# Patient Record
Sex: Female | Born: 1960
Health system: Southern US, Community
[De-identification: ages and names within clinical notes are randomized; demographics above are authoritative.]

## PROBLEM LIST (undated history)

## (undated) DIAGNOSIS — F32A Depression, unspecified: Secondary | ICD-10-CM

## (undated) DIAGNOSIS — F329 Major depressive disorder, single episode, unspecified: Secondary | ICD-10-CM

## (undated) DIAGNOSIS — F419 Anxiety disorder, unspecified: Secondary | ICD-10-CM

## (undated) DIAGNOSIS — D509 Iron deficiency anemia, unspecified: Secondary | ICD-10-CM

## (undated) DIAGNOSIS — E079 Disorder of thyroid, unspecified: Secondary | ICD-10-CM

## (undated) DIAGNOSIS — E039 Hypothyroidism, unspecified: Secondary | ICD-10-CM

## (undated) HISTORY — PX: OTHER SURGICAL HISTORY: SHX169

## (undated) HISTORY — DX: Depression, unspecified: F32.A

## (undated) HISTORY — DX: Anxiety disorder, unspecified: F41.9

## (undated) HISTORY — DX: Major depressive disorder, single episode, unspecified: F32.9

## (undated) HISTORY — PX: PLACEMENT OF BREAST IMPLANTS: SHX6334

---

## 1997-07-02 ENCOUNTER — Other Ambulatory Visit: Admission: RE | Admit: 1997-07-02 | Discharge: 1997-07-02 | Payer: Self-pay | Admitting: Obstetrics and Gynecology

## 1997-10-03 ENCOUNTER — Other Ambulatory Visit: Admission: RE | Admit: 1997-10-03 | Discharge: 1997-10-03 | Payer: Self-pay | Admitting: Obstetrics and Gynecology

## 1998-07-15 ENCOUNTER — Other Ambulatory Visit: Admission: RE | Admit: 1998-07-15 | Discharge: 1998-07-15 | Payer: Self-pay | Admitting: Obstetrics and Gynecology

## 1998-12-23 ENCOUNTER — Encounter (INDEPENDENT_AMBULATORY_CARE_PROVIDER_SITE_OTHER): Payer: Self-pay

## 1998-12-23 ENCOUNTER — Ambulatory Visit (HOSPITAL_COMMUNITY): Admission: RE | Admit: 1998-12-23 | Discharge: 1998-12-23 | Payer: Self-pay | Admitting: *Deleted

## 1999-07-28 ENCOUNTER — Other Ambulatory Visit: Admission: RE | Admit: 1999-07-28 | Discharge: 1999-07-28 | Payer: Self-pay | Admitting: Obstetrics and Gynecology

## 2000-08-16 ENCOUNTER — Other Ambulatory Visit: Admission: RE | Admit: 2000-08-16 | Discharge: 2000-08-16 | Payer: Self-pay | Admitting: Obstetrics and Gynecology

## 2001-10-03 ENCOUNTER — Other Ambulatory Visit: Admission: RE | Admit: 2001-10-03 | Discharge: 2001-10-03 | Payer: Self-pay | Admitting: Obstetrics and Gynecology

## 2002-10-09 ENCOUNTER — Other Ambulatory Visit: Admission: RE | Admit: 2002-10-09 | Discharge: 2002-10-09 | Payer: Self-pay | Admitting: Obstetrics and Gynecology

## 2003-11-12 ENCOUNTER — Other Ambulatory Visit: Admission: RE | Admit: 2003-11-12 | Discharge: 2003-11-12 | Payer: Self-pay | Admitting: Obstetrics and Gynecology

## 2004-12-23 ENCOUNTER — Other Ambulatory Visit: Admission: RE | Admit: 2004-12-23 | Discharge: 2004-12-23 | Payer: Self-pay | Admitting: Obstetrics and Gynecology

## 2007-04-04 ENCOUNTER — Ambulatory Visit: Payer: Self-pay | Admitting: Gastroenterology

## 2009-08-10 ENCOUNTER — Emergency Department (HOSPITAL_COMMUNITY): Admission: EM | Admit: 2009-08-10 | Discharge: 2009-08-10 | Payer: Self-pay | Admitting: Emergency Medicine

## 2009-11-04 ENCOUNTER — Emergency Department (HOSPITAL_COMMUNITY): Admission: EM | Admit: 2009-11-04 | Discharge: 2009-11-05 | Payer: Self-pay | Admitting: Emergency Medicine

## 2010-01-29 ENCOUNTER — Other Ambulatory Visit (HOSPITAL_COMMUNITY): Payer: Self-pay | Admitting: Endocrinology

## 2010-01-29 DIAGNOSIS — E049 Nontoxic goiter, unspecified: Secondary | ICD-10-CM

## 2010-02-10 ENCOUNTER — Ambulatory Visit (HOSPITAL_COMMUNITY): Payer: Self-pay

## 2010-02-11 ENCOUNTER — Other Ambulatory Visit (HOSPITAL_COMMUNITY): Payer: Self-pay

## 2010-02-26 ENCOUNTER — Other Ambulatory Visit: Payer: Self-pay | Admitting: Endocrinology

## 2010-02-26 DIAGNOSIS — E049 Nontoxic goiter, unspecified: Secondary | ICD-10-CM

## 2010-02-27 ENCOUNTER — Other Ambulatory Visit: Payer: Self-pay

## 2010-02-27 ENCOUNTER — Ambulatory Visit
Admission: RE | Admit: 2010-02-27 | Discharge: 2010-02-27 | Disposition: A | Discharge status: Home / Self Care | Payer: BC Managed Care – PPO | Source: Ambulatory Visit | Attending: Endocrinology | Admitting: Endocrinology

## 2010-02-27 DIAGNOSIS — E049 Nontoxic goiter, unspecified: Secondary | ICD-10-CM

## 2010-03-17 ENCOUNTER — Other Ambulatory Visit: Payer: Self-pay | Admitting: Surgery

## 2010-03-17 ENCOUNTER — Encounter (HOSPITAL_COMMUNITY): Payer: BC Managed Care – PPO

## 2010-03-17 LAB — BASIC METABOLIC PANEL
BUN: 12 mg/dL (ref 6–23)
Creatinine, Ser: 0.67 mg/dL (ref 0.4–1.2)
GFR calc Af Amer: >60 mL/min (ref >60)
GFR calc non Af Amer: >60 mL/min (ref >60)
Glucose, Bld: 81 mg/dL (ref 70–99)
Potassium: 3.6 mEq/L (ref 3.5–5.1)
Sodium: 135 mEq/L (ref 135–145)

## 2010-03-17 LAB — CBC
HCT: 37.9 % (ref 36.0–46.0)
Hemoglobin: 12.1 g/dL (ref 12.0–15.0)
MCH: 24.5 pg — ABNORMAL LOW (ref 26.0–34.0)
MCHC: 31.9 g/dL (ref 30.0–36.0)

## 2010-03-17 LAB — DIFFERENTIAL
Lymphocytes Relative: 28 % (ref 12–46)
Monocytes Absolute: 0.5 10*3/uL (ref 0.1–1.0)
Monocytes Relative: 6 % (ref 3–12)
Neutro Abs: 5.1 10*3/uL (ref 1.7–7.7)

## 2010-03-17 LAB — URINALYSIS, ROUTINE W REFLEX MICROSCOPIC
Bilirubin Urine: NEGATIVE
Glucose, UA: NEGATIVE mg/dL
Hgb urine dipstick: NEGATIVE
Ketones, ur: NEGATIVE mg/dL
Protein, ur: NEGATIVE mg/dL

## 2010-03-17 LAB — SURGICAL PCR SCREEN: Staphylococcus aureus: POSITIVE — AB

## 2010-03-17 LAB — PROTIME-INR: Prothrombin Time: 13 seconds (ref 11.6–15.2)

## 2010-03-18 LAB — CBC
Hemoglobin: 12 g/dL (ref 12.0–15.0)
MCH: 25.8 pg — ABNORMAL LOW (ref 26.0–34.0)
MCHC: 33.2 g/dL (ref 30.0–36.0)
MCV: 77.7 fL — ABNORMAL LOW (ref 78.0–100.0)
Platelets: 240 10*3/uL (ref 150–400)
RBC: 4.65 MIL/uL (ref 3.87–5.11)

## 2010-03-18 LAB — DIFFERENTIAL
Basophils Absolute: 0 10*3/uL (ref 0.0–0.1)
Lymphocytes Relative: 25 % (ref 12–46)
Lymphs Abs: 1.4 10*3/uL (ref 0.7–4.0)
Neutro Abs: 3.8 10*3/uL (ref 1.7–7.7)

## 2010-03-18 LAB — COMPREHENSIVE METABOLIC PANEL
BUN: 7 mg/dL (ref 6–23)
CO2: 30 mEq/L (ref 19–32)
Calcium: 9.6 mg/dL (ref 8.4–10.5)
Chloride: 102 mEq/L (ref 96–112)
Creatinine, Ser: 0.64 mg/dL (ref 0.4–1.2)
GFR calc Af Amer: >60 mL/min (ref >60)
GFR calc non Af Amer: >60 mL/min (ref >60)
Total Bilirubin: 0.8 mg/dL (ref 0.3–1.2)

## 2010-03-18 LAB — LIPASE, BLOOD: Lipase: 31 U/L (ref 11–59)

## 2010-03-21 ENCOUNTER — Ambulatory Visit (HOSPITAL_COMMUNITY)
Admission: RE | Admit: 2010-03-21 | Discharge: 2010-03-23 | Disposition: A | Discharge status: Home / Self Care | Payer: BC Managed Care – PPO | Source: Ambulatory Visit | Attending: Surgery | Admitting: Surgery

## 2010-03-21 ENCOUNTER — Other Ambulatory Visit: Payer: Self-pay | Admitting: Surgery

## 2010-03-21 DIAGNOSIS — Z01812 Encounter for preprocedural laboratory examination: Secondary | ICD-10-CM | POA: Insufficient documentation

## 2010-03-21 DIAGNOSIS — E041 Nontoxic single thyroid nodule: Secondary | ICD-10-CM | POA: Insufficient documentation

## 2010-03-21 DIAGNOSIS — Z01818 Encounter for other preprocedural examination: Secondary | ICD-10-CM | POA: Insufficient documentation

## 2010-03-21 LAB — DIFFERENTIAL
Basophils Absolute: 0 10*3/uL (ref 0.0–0.1)
Basophils Relative: 0 % (ref 0–1)
Eosinophils Relative: 0 % (ref 0–5)
Monocytes Absolute: 0.4 10*3/uL (ref 0.1–1.0)
Neutro Abs: 9.2 10*3/uL — ABNORMAL HIGH (ref 1.7–7.7)

## 2010-03-21 LAB — CBC
Hemoglobin: 11.7 g/dL — ABNORMAL LOW (ref 12.0–15.0)
MCHC: 33.1 g/dL (ref 30.0–36.0)
RBC: 4.41 MIL/uL (ref 3.87–5.11)
WBC: 10.8 10*3/uL — ABNORMAL HIGH (ref 4.0–10.5)

## 2010-03-21 LAB — BASIC METABOLIC PANEL
Calcium: 8.8 mg/dL (ref 8.4–10.5)
GFR calc Af Amer: >60 mL/min (ref >60)
GFR calc non Af Amer: >60 mL/min (ref >60)
Glucose, Bld: 104 mg/dL — ABNORMAL HIGH (ref 70–99)
Sodium: 140 mEq/L (ref 135–145)

## 2010-03-21 LAB — CK TOTAL AND CKMB (NOT AT ARMC)
CK, MB: 0.8 ng/mL (ref 0.3–4.0)
Relative Index: INVALID (ref 0.0–2.5)
Total CK: 62 U/L (ref 7–177)

## 2010-03-21 LAB — URINALYSIS, ROUTINE W REFLEX MICROSCOPIC
Nitrite: NEGATIVE
Specific Gravity, Urine: 1.005 (ref 1.005–1.030)
Urobilinogen, UA: 0.2 mg/dL (ref 0.0–1.0)
pH: 7.5 (ref 5.0–8.0)

## 2010-03-21 LAB — TSH: TSH: 0.016 u[IU]/mL — ABNORMAL LOW (ref 0.350–4.500)

## 2010-03-23 LAB — CALCIUM: Calcium: 8.6 mg/dL (ref 8.4–10.5)

## 2010-03-28 NOTE — Op Note (Signed)
NAME:  Lauren Bolton, Lauren Bolton                ACCOUNT NO.:  [id number]  MEDICAL RECORD NO.:  [id number]           PATIENT TYPE:  O  LOCATION:  DAYL                         FACILITY:  [facility]  PHYSICIAN:  Velora Heckler, MD      DATE OF BIRTH:  22-Aug-1960  DATE OF PROCEDURE:  21 March 2010                               OPERATIVE REPORT   PREOPERATIVE DIAGNOSIS:  Thyroid nodules, thyroid goiter.  POSTOPERATIVE DIAGNOSIS:  Thyroid nodules, thyroid goiter.  PROCEDURE:  Total thyroidectomy.  SURGEON:  Velora Heckler, MD, FACS  ANESTHESIA:  General per Dr. Lestine Box.  ESTIMATED BLOOD LOSS:  Minimal.  PREPARATION:  ChloraPrep.  COMPLICATIONS:  None.  INDICATIONS:  The patient is a 50 year old [place] female, referred by Dr. Adrian Prince, with thyroid goiter and right-sided thyroid nodule. The patient has noted an enlarged thyroid for several years.  Ultrasound in February 2012 showed an 11.8-mm nodule in the right thyroid lobe.  It has been difficult to regulate her thyroid hormone levels with borderline hyperthyroidism.  The patient now comes to surgery for total thyroidectomy.  PROCEDURE IN DETAIL:  Procedure was done in OR #11 at the [facility].  The patient was brought to the operating room, placed in supine position on the operating room table.  Following administration of general anesthesia, the patient is positioned and then prepped and draped in the usual strict aseptic fashion.  After ascertaining that an adequate level of anesthesia had been achieved, a Kocher incision was made with a #15 blade.  Dissection was carried through subcutaneous tissues and platysma.  Hemostasis was obtained with electrocautery.  Skin flaps were elevated cephalad and caudad from the thyroid notch to the sternal notch and a Mahorner self-retaining retractor was placed for exposure.  Strap muscles were incised in the midline and reflected initially to the left, exposing the  left thyroid lobe.  Left lobe was gently mobilized.  Inferior venous tributaries were divided between medium Ligaclips with a harmonic scalpel.  Superior pole was dissected out and superior pole vessels were divided individually between medium Ligaclips with a harmonic scalpel.  Gland is rolled anteriorly.  Parathyroid tissue was identified and preserved.  Branches of the inferior thyroid artery are divided between small Ligaclips with the harmonic scalpel.  Recurrent nerve was identified and preserved. Ligament of Allyson Sabal was released with electrocautery and the gland was mobilized up and on to the anterior airway.  There is a moderate-sized pyramidal lobe which was dissected off the thyroid cartilage and hemostasis obtained with the electrocautery.  It was resected on block with the thyroid isthmus which was mobilized across the midline.  Dry pack is placed in the left neck.  Next, the right thyroid lobe was addressed.  Strap muscles were reflected to the right exposing the right lobe.  Venous tributaries were divided between Ligaclips with the harmonic scalpel.  Inferior venous tributaries were divided between medium Ligaclips with the harmonic scalpel.  Superior pole was dissected out and superior pole vessels divided between medium Ligaclips with the harmonic scalpel.  Gland is rolled anteriorly.  Branches of the inferior  thyroid artery are divided between small Ligaclips with the harmonic scalpel.  Recurrent nerve was identified and preserved.  Ligament of Allyson Sabal was released with electrocautery and the gland is rolled up and on to the anterior trachea from which it was completely excised with the electrocautery.  Sutures used to mark the right superior pole.  The entire thyroid gland is submitted to pathology for review.  Neck was irrigated with warm saline.  Good hemostasis was achieved bilaterally.  Surgicel was placed in the operative field.  Strap muscles were reapproximated in  the midline with interrupted 3-0 Vicryl sutures. Platysma was closed with interrupted 3-0 Vicryl sutures.  Skin was closed with a running 4-0 Monocryl subcuticular suture.  Wound is washed and dried and Benzoin and Steri-Strips were applied.  Sterile dressings were applied.  The patient is awakened from anesthesia and brought to the recovery room.  The patient tolerated the procedure well.   Velora Heckler, MD, FACS     TMG/MEDQ  D:  03/21/2010  T:  03/21/2010  Job:  [id number]  cc:   Jeannett Senior A. Evlyn Kanner, M.D. Fax: [phone]  Electronically Signed by Darnell Level MD on 03/28/2010 09:23:15 AM

## 2010-05-20 NOTE — Assessment & Plan Note (Signed)
[facility]                         GASTROENTEROLOGY OFFICE NOTE   NAME:LONGCharlotte, Fidalgo                         MRN:          [id number]  DATE:04/04/2007                            DOB:          1960-07-17    REFERRING PHYSICIAN:  Guy Sandifer. Henderson Cloud, M.D.   PRIMARY CARE PHYSICIAN:  Dr. Tera Mater. [place].   REASON FOR CONSULTATION:  Dr. Guy Sandifer. [place] asked me to evaluate Ms.  Mccarter in consultation regarding heme-positive stool.   HISTORY OF PRESENT ILLNESS:  Ms. Navia is a very pleasant 50 year old  [place]born woman who was recently found on routine exanimation to  have heme-positive stool.  It sounds like this was 1 out of 2 samples  done.  She does not have overt GI bleeding and only very minor  constipation.  It happens less than once a month even.  She has no  diarrhea.  Her weight has been essentially stable for years.  She does  have biopsy reports in the E-chart that show active mucosal colitis from  December 2000.  She does not recall ever having a colonoscopy though.  This was done by Dr. Althea Grimmer. [place], who has moved away from  [place].  He also apparently did an EGD, because there were small  biopsies done that showed were normal.   REVIEW OF SYSTEMS:  Essentially negative and is available on her nursing  intake sheet.   PAST MEDICAL HISTORY:  1. Thyroid trouble.  2. Anxiety.  3. Sleep apnea.   CURRENT MEDICATIONS:  1. Levothyroxine 75 mcg daily.  2. Multivitamins.   ALLERGIES:  No known drug allergies.   SOCIAL HISTORY:  Single.  Has two children.  Lives with one of her sons.  She is a nonsmoker, nondrinker.   FAMILY HISTORY:  No colon cancer.  No colon polyps.   PHYSICAL EXAMINATION:  VITAL SIGNS:  Height is 5 feet, 92 pounds, blood  pressure 116/68, pulse 68.  CONSTITUTIONAL:  Generally well-appearing.  NEUROLOGIC:  Alert and oriented x3.  HEENT:  Eyes:  Extraocular movements intact.  Mouth:  Oropharynx moist  with no lesions.  NECK:  Supple, no lymphadenopathy.  CARDIOVASCULAR:  A regular rate and rhythm.  LUNGS:  Clear to auscultation bilaterally.  ABDOMEN:  Soft, nontender, nondistended.  Normal bowel sounds.  EXTREMITIES:  No lower extremity edema.  SKIN:  No rashes or lesions on visible extremities.   ASSESSMENT:  A 50 year old woman with heme-positive stool.   PLAN:  First we will work to get the reports sent over from Dr.  Joanette Gula office [facility]), from previous 2000, colonoscopy.  She  does not recall ever having had a colonoscopy though.  She does have  heme-positive stool.  Otherwise no overt GI symptoms, and so she should  undergo a full colonoscopy at her soonest convenience.  I see no reason  for any further blood tests or imaging studies prior to that.     Rachael Fee, MD  Electronically Signed    DPJ/MedQ  DD: 04/04/2007  DT: 04/04/2007  Job #: [id number]   cc:   Fayrene Fearing  Jamal Collin, M.D.  Tera Mater. Evlyn Kanner, M.D.

## 2010-08-14 ENCOUNTER — Other Ambulatory Visit: Payer: Self-pay | Admitting: Gastroenterology

## 2010-08-14 DIAGNOSIS — R140 Abdominal distension (gaseous): Secondary | ICD-10-CM

## 2010-08-18 ENCOUNTER — Ambulatory Visit
Admission: RE | Admit: 2010-08-18 | Discharge: 2010-08-18 | Disposition: A | Discharge status: Home / Self Care | Payer: BC Managed Care – PPO | Source: Ambulatory Visit | Attending: Gastroenterology | Admitting: Gastroenterology

## 2010-08-18 DIAGNOSIS — R140 Abdominal distension (gaseous): Secondary | ICD-10-CM

## 2011-05-20 ENCOUNTER — Other Ambulatory Visit (INDEPENDENT_AMBULATORY_CARE_PROVIDER_SITE_OTHER): Payer: Self-pay | Admitting: Surgery

## 2014-01-17 ENCOUNTER — Other Ambulatory Visit: Payer: Self-pay | Admitting: Obstetrics and Gynecology

## 2014-01-19 LAB — CYTOLOGY - PAP

## 2015-06-20 ENCOUNTER — Emergency Department (HOSPITAL_COMMUNITY): Payer: BLUE CROSS/BLUE SHIELD

## 2015-06-20 ENCOUNTER — Emergency Department (HOSPITAL_COMMUNITY)
Admission: EM | Admit: 2015-06-20 | Discharge: 2015-06-21 | Disposition: A | Discharge status: Home / Self Care | Payer: BLUE CROSS/BLUE SHIELD | Attending: Emergency Medicine | Admitting: Emergency Medicine

## 2015-06-20 ENCOUNTER — Other Ambulatory Visit: Payer: Self-pay

## 2015-06-20 ENCOUNTER — Encounter (HOSPITAL_COMMUNITY): Payer: Self-pay | Admitting: *Deleted

## 2015-06-20 DIAGNOSIS — R5383 Other fatigue: Secondary | ICD-10-CM | POA: Diagnosis present

## 2015-06-20 DIAGNOSIS — D509 Iron deficiency anemia, unspecified: Secondary | ICD-10-CM | POA: Diagnosis not present

## 2015-06-20 DIAGNOSIS — R5382 Chronic fatigue, unspecified: Secondary | ICD-10-CM

## 2015-06-20 DIAGNOSIS — R519 Headache, unspecified: Secondary | ICD-10-CM

## 2015-06-20 DIAGNOSIS — R51 Headache: Secondary | ICD-10-CM | POA: Insufficient documentation

## 2015-06-20 HISTORY — DX: Iron deficiency anemia, unspecified: D50.9

## 2015-06-20 HISTORY — DX: Disorder of thyroid, unspecified: E07.9

## 2015-06-20 LAB — CBC WITH DIFFERENTIAL/PLATELET
BASOS PCT: 0 %
Basophils Absolute: 0 10*3/uL (ref 0.0–0.1)
EOS PCT: 0 %
Eosinophils Absolute: 0 10*3/uL (ref 0.0–0.7)
HEMATOCRIT: 32.9 % — AB (ref 36.0–46.0)
HEMOGLOBIN: 11 g/dL — AB (ref 12.0–15.0)
LYMPHS PCT: 25 %
Lymphs Abs: 1.3 10*3/uL (ref 0.7–4.0)
MCH: 24.7 pg — AB (ref 26.0–34.0)
MCHC: 33.4 g/dL (ref 30.0–36.0)
MCV: 73.9 fL — ABNORMAL LOW (ref 78.0–100.0)
MONOS PCT: 7 %
Monocytes Absolute: 0.4 10*3/uL (ref 0.1–1.0)
NEUTROS ABS: 3.6 10*3/uL (ref 1.7–7.7)
NEUTROS PCT: 68 %
Platelets: 152 10*3/uL (ref 150–400)
RBC: 4.45 MIL/uL (ref 3.87–5.11)
RDW: 14.4 % (ref 11.5–15.5)
WBC: 5.3 10*3/uL (ref 4.0–10.5)

## 2015-06-20 LAB — COMPREHENSIVE METABOLIC PANEL
ALBUMIN: 4.3 g/dL (ref 3.5–5.0)
ALT: 17 U/L (ref 14–54)
ANION GAP: 8 (ref 5–15)
AST: 30 U/L (ref 15–41)
Alkaline Phosphatase: 61 U/L (ref 38–126)
BILIRUBIN TOTAL: 0.3 mg/dL (ref 0.3–1.2)
BUN: 11 mg/dL (ref 6–20)
CHLORIDE: 101 mmol/L (ref 101–111)
CO2: 23 mmol/L (ref 22–32)
Calcium: 8.8 mg/dL — ABNORMAL LOW (ref 8.9–10.3)
Creatinine, Ser: 0.78 mg/dL (ref 0.44–1.00)
GFR calc Af Amer: >60 mL/min (ref >60)
GFR calc non Af Amer: >60 mL/min (ref >60)
GLUCOSE: 95 mg/dL (ref 65–99)
POTASSIUM: 4.2 mmol/L (ref 3.5–5.1)
SODIUM: 132 mmol/L — AB (ref 135–145)
Total Protein: 7.5 g/dL (ref 6.5–8.1)

## 2015-06-20 LAB — URINALYSIS, ROUTINE W REFLEX MICROSCOPIC
BILIRUBIN URINE: NEGATIVE
Glucose, UA: NEGATIVE mg/dL
Ketones, ur: NEGATIVE mg/dL
Leukocytes, UA: NEGATIVE
NITRITE: NEGATIVE
PH: 6.5 (ref 5.0–8.0)
Protein, ur: NEGATIVE mg/dL
SPECIFIC GRAVITY, URINE: 1.004 — AB (ref 1.005–1.030)

## 2015-06-20 LAB — TROPONIN I: Troponin I: <0.03 ng/mL (ref <0.031)

## 2015-06-20 LAB — TSH: TSH: 3.768 u[IU]/mL (ref 0.350–4.500)

## 2015-06-20 LAB — URINE MICROSCOPIC-ADD ON
RBC / HPF: NONE SEEN RBC/hpf (ref 0–5)
Squamous Epithelial / LPF: NONE SEEN

## 2015-06-20 LAB — LIPASE, BLOOD: LIPASE: 31 U/L (ref 11–51)

## 2015-06-20 LAB — CBG MONITORING, ED: GLUCOSE-CAPILLARY: 102 mg/dL — AB (ref 65–99)

## 2015-06-20 MED ORDER — SODIUM CHLORIDE 0.9 % IV BOLUS (SEPSIS)
1000.0000 mL | Freq: Once | INTRAVENOUS | Status: AC
  Administered 2015-06-20: 1000 mL via INTRAVENOUS

## 2015-06-20 NOTE — ED Provider Notes (Signed)
CSN: [id number]     Arrival date & time 06/20/15  2039 History   First MD Initiated Contact with Patient 06/20/15 2108     Chief Complaint  Patient presents with  . Fatigue  PT IS A 55 YEAR OLD [place] WOMAN WHO SPEAKS EXCELLENT ENGLISH.  SHE SAID THAT SHE HAS BEEN FATIGUED FOR THE PAST 2 DAYS.  THE PT SAID SHE HAD A H/A AND HER SKIN FEELS TIGHT.  SHE HAS GERD AND NAUSEA.  SHE JUST "FEELS BAD."  SHE IS TEARFUL ON EXAM.  PT DID GO TO SEE HER PCP WHO GAVE HER PAIN MEDICINE Elmo Putt NAME) FOR HER HEAD.  SHE TOOK A DOSE LAST NIGHT AND IT DID NOT SEEM TO HELP.   (Consider location/radiation/quality/duration/timing/severity/associated sxs/prior Treatment) The history is provided by the patient.    Past Medical History  Diagnosis Date  . Thyroid disease   . Iron deficiency anemia    Past Surgical History  Procedure Laterality Date  . Thyroid goiter     No family history on file. Social History  Substance Use Topics  . Smoking status: Never Smoker   . Smokeless tobacco: None  . Alcohol Use: No   OB History    No data available     Review of Systems  Neurological: Positive for weakness and headaches.  All other systems reviewed and are negative.     Allergies  Review of patient's allergies indicates no known allergies.  Home Medications   Prior to Admission medications   Medication Sig Start Date End Date Taking? Authorizing Provider  ferrous sulfate 325 (65 FE) MG tablet Take 1 tablet (325 mg total) by mouth 3 (three) times daily with meals. 06/21/15   Jacalyn Lefevre, MD  ibuprofen (ADVIL,MOTRIN) 400 MG tablet Take 1 tablet (400 mg total) by mouth every 6 (six) hours as needed. 06/21/15   Jacalyn Lefevre, MD   BP 159/80 mmHg  Pulse 68  Temp(Src) 98.1 F (36.7 C) (Oral)  Resp 20  Ht 5' (1.524 m)  Wt 89 lb (40.37 kg)  BMI 17.38 kg/m2  SpO2 100% Physical Exam  Constitutional: She is oriented to person, place, and time. She appears well-developed and well-nourished.   HENT:  Head: Normocephalic and atraumatic.  Right Ear: External ear normal.  Left Ear: External ear normal.  Nose: Nose normal.  Mouth/Throat: Oropharynx is clear and moist.  Eyes: Conjunctivae and EOM are normal. Pupils are equal, round, and reactive to light.  Neck: Normal range of motion. Neck supple.  Cardiovascular: Normal rate, regular rhythm, normal heart sounds and intact distal pulses.   Pulmonary/Chest: Effort normal and breath sounds normal.  Abdominal: Soft. Bowel sounds are normal.  Musculoskeletal: Normal range of motion.  Neurological: She is alert and oriented to person, place, and time.  Skin: Skin is warm and dry.  Psychiatric: Her behavior is normal. Judgment and thought content normal. Her mood appears anxious. She exhibits a depressed mood.  Nursing note and vitals reviewed.   ED Course  Procedures (including critical care time) Labs Review Labs Reviewed  COMPREHENSIVE METABOLIC PANEL - Abnormal; Notable for the following:    Sodium 132 (*)    Calcium 8.8 (*)    All other components within normal limits  CBC WITH DIFFERENTIAL/PLATELET - Abnormal; Notable for the following:    Hemoglobin 11.0 (*)    HCT 32.9 (*)    MCV 73.9 (*)    MCH 24.7 (*)    All other components within normal limits  URINALYSIS,  ROUTINE W REFLEX MICROSCOPIC (NOT AT [facility]) - Abnormal; Notable for the following:    Specific Gravity, Urine 1.004 (*)    Hgb urine dipstick TRACE (*)    All other components within normal limits  URINE MICROSCOPIC-ADD ON - Abnormal; Notable for the following:    Bacteria, UA RARE (*)    All other components within normal limits  CBG MONITORING, ED - Abnormal; Notable for the following:    Glucose-Capillary 102 (*)    All other components within normal limits  TROPONIN I  TSH  LIPASE, BLOOD  CBG MONITORING, ED    Imaging Review Dg Chest 2 View  06/20/2015  CLINICAL DATA:  Fatigue for 48 hours EXAM: CHEST  2 VIEW COMPARISON:  08/10/2009 FINDINGS:  Cardiac shadow is within normal limits. The lungs are clear bilaterally. Bilateral breast implants are noted. Bilateral nipple shadows are seen. No acute bony abnormality is noted. IMPRESSION: No active cardiopulmonary disease. Electronically Signed   By: Alcide CleverMark  Lukens M.D.   On: 06/20/2015 21:58   Ct Head Wo Contrast  06/20/2015  CLINICAL DATA:  Generalized fatigue for 2 days. EXAM: CT HEAD WITHOUT CONTRAST TECHNIQUE: Contiguous axial images were obtained from the base of the skull through the vertex without intravenous contrast. COMPARISON:  None. FINDINGS: No evidence for acute hemorrhage, mass lesion, midline shift, hydrocephalus or large infarct. Subtle calcifications in the basal gangliar regions. Small amount of mucosal disease in the right sphenoid sinus. No calvarial fracture. IMPRESSION: No acute intracranial abnormality. Electronically Signed   By: Richarda OverlieAdam  Henn M.D.   On: 06/20/2015 22:04   I have personally reviewed and evaluated these images and lab results as part of my medical decision-making.   EKG Interpretation None      EKG:  HR 68.  NSR.  NO ST OR T WAVE CHANGES  MDM  PT IS FEELING BETTER.  I SPOKE WITH HER ABOUT DIETARY CHANGES TO INCREASE PROTEIN AND IRON IN DIET.  PT KNOWS TO F/U WITH PCP AND RETURN IF WORSE. Final diagnoses:  Iron deficiency anemia  Chronic fatigue  Acute nonintractable headache, unspecified headache type       Jacalyn LefevreJulie Jaquavious Mercer, MD 06/21/15 [phone]

## 2015-06-20 NOTE — ED Notes (Signed)
Pt c/o generalized fatigue x 2 days; pt states that she saw the NP yesterday and was told was fine; pt reports that she has a tight feeling to her face; pt initially reported syncopal episode but after talking with pt and husband no syncopal episode; pt states that she has no energy and feels like she cannot go on; pt c/o chills; pt c/o nausea with no vomiting

## 2015-06-21 MED ORDER — FERROUS SULFATE 325 (65 FE) MG PO TABS: 325.0000 mg | ORAL_TABLET | Freq: Three times a day (TID) | ORAL | Status: DC

## 2015-06-21 MED ORDER — IBUPROFEN 400 MG PO TABS: 400.0000 mg | ORAL_TABLET | Freq: Four times a day (QID) | ORAL | Status: DC | PRN

## 2015-06-21 NOTE — Discharge Instructions (Signed)
Anemia, Nonspecific Anemia is a condition in which the concentration of red blood cells or hemoglobin in the blood is below normal. Hemoglobin is a substance in red blood cells that carries oxygen to the tissues of the body. Anemia results in not enough oxygen reaching these tissues.  CAUSES  Common causes of anemia include:   Excessive bleeding. Bleeding may be internal or external. This includes excessive bleeding from periods (in women) or from the intestine.   Poor nutrition.   Chronic kidney, thyroid, and liver disease.  Bone marrow disorders that decrease red blood cell production.  Cancer and treatments for cancer.  HIV, AIDS, and their treatments.  Spleen problems that increase red blood cell destruction.  Blood disorders.  Excess destruction of red blood cells due to infection, medicines, and autoimmune disorders. SIGNS AND SYMPTOMS   Minor weakness.   Dizziness.   Headache.  Palpitations.   Shortness of breath, especially with exercise.   Paleness.  Cold sensitivity.  Indigestion.  Nausea.  Difficulty sleeping.  Difficulty concentrating. Symptoms may occur suddenly or they may develop slowly.  DIAGNOSIS  Additional blood tests are often needed. These help your health care provider determine the best treatment. Your health care provider will check your stool for blood and look for other causes of blood loss.  TREATMENT  Treatment varies depending on the cause of the anemia. Treatment can include:   Supplements of iron, vitamin B12, or folic acid.   Hormone medicines.   A blood transfusion. This may be needed if blood loss is severe.   Hospitalization. This may be needed if there is significant continual blood loss.   Dietary changes.  Spleen removal. HOME CARE INSTRUCTIONS Keep all follow-up appointments. It often takes many weeks to correct anemia, and having your health care provider check on your condition and your response to  treatment is very important. SEEK IMMEDIATE MEDICAL CARE IF:   You develop extreme weakness, shortness of breath, or chest pain.   You become dizzy or have trouble concentrating.  You develop heavy vaginal bleeding.   You develop a rash.   You have bloody or black, tarry stools.   You faint.   You vomit up blood.   You vomit repeatedly.   You have abdominal pain.  You have a fever or persistent symptoms for more than 2-3 days.   You have a fever and your symptoms suddenly get worse.   You are dehydrated.  MAKE SURE YOU:  Understand these instructions.  Will watch your condition.  Will get help right away if you are not doing well or get worse.   This information is not intended to replace advice given to you by your health care provider. Make sure you discuss any questions you have with your health care provider.   Document Released: 01/30/2004 Document Revised: 08/24/2012 Document Reviewed: 06/17/2012 Elsevier Interactive Patient Education 2016 Elsevier Inc.  

## 2015-10-16 DIAGNOSIS — E890 Postprocedural hypothyroidism: Secondary | ICD-10-CM | POA: Diagnosis not present

## 2015-10-17 DIAGNOSIS — E890 Postprocedural hypothyroidism: Secondary | ICD-10-CM | POA: Diagnosis not present

## 2015-10-17 DIAGNOSIS — E784 Other hyperlipidemia: Secondary | ICD-10-CM | POA: Diagnosis not present

## 2015-10-17 DIAGNOSIS — N951 Menopausal and female climacteric states: Secondary | ICD-10-CM | POA: Diagnosis not present

## 2015-10-17 DIAGNOSIS — D6489 Other specified anemias: Secondary | ICD-10-CM | POA: Diagnosis not present

## 2015-11-05 DIAGNOSIS — F419 Anxiety disorder, unspecified: Secondary | ICD-10-CM | POA: Diagnosis not present

## 2015-11-07 ENCOUNTER — Emergency Department (HOSPITAL_COMMUNITY)
Admission: EM | Admit: 2015-11-07 | Discharge: 2015-11-07 | Disposition: A | Discharge status: Home / Self Care | Payer: BLUE CROSS/BLUE SHIELD | Attending: Emergency Medicine | Admitting: Emergency Medicine

## 2015-11-07 ENCOUNTER — Encounter (HOSPITAL_COMMUNITY): Payer: Self-pay

## 2015-11-07 DIAGNOSIS — R112 Nausea with vomiting, unspecified: Secondary | ICD-10-CM | POA: Diagnosis not present

## 2015-11-07 DIAGNOSIS — Z79899 Other long term (current) drug therapy: Secondary | ICD-10-CM | POA: Insufficient documentation

## 2015-11-07 LAB — COMPREHENSIVE METABOLIC PANEL
ALT: 20 U/L (ref 14–54)
AST: 30 U/L (ref 15–41)
Albumin: 4.7 g/dL (ref 3.5–5.0)
Alkaline Phosphatase: 69 U/L (ref 38–126)
Anion gap: 10 (ref 5–15)
BUN: <5 mg/dL — ABNORMAL LOW (ref 6–20)
CO2: 27 mmol/L (ref 22–32)
Calcium: 9.3 mg/dL (ref 8.9–10.3)
Chloride: 96 mmol/L — ABNORMAL LOW (ref 101–111)
Creatinine, Ser: 0.79 mg/dL (ref 0.44–1.00)
GFR calc Af Amer: >60 mL/min (ref >60)
GFR calc non Af Amer: >60 mL/min (ref >60)
Glucose, Bld: 102 mg/dL — ABNORMAL HIGH (ref 65–99)
Potassium: 3.2 mmol/L — ABNORMAL LOW (ref 3.5–5.1)
Sodium: 133 mmol/L — ABNORMAL LOW (ref 135–145)
Total Bilirubin: 0.9 mg/dL (ref 0.3–1.2)
Total Protein: 8.2 g/dL — ABNORMAL HIGH (ref 6.5–8.1)

## 2015-11-07 LAB — CBC WITH DIFFERENTIAL/PLATELET
Basophils Absolute: 0 10*3/uL (ref 0.0–0.1)
Basophils Relative: 0 %
Eosinophils Absolute: 0 10*3/uL (ref 0.0–0.7)
Eosinophils Relative: 0 %
HCT: 33.9 % — ABNORMAL LOW (ref 36.0–46.0)
Hemoglobin: 11.2 g/dL — ABNORMAL LOW (ref 12.0–15.0)
Lymphocytes Relative: 24 %
Lymphs Abs: 1.7 10*3/uL (ref 0.7–4.0)
MCH: 24.5 pg — ABNORMAL LOW (ref 26.0–34.0)
MCHC: 33 g/dL (ref 30.0–36.0)
MCV: 74.2 fL — ABNORMAL LOW (ref 78.0–100.0)
Monocytes Absolute: 0.5 10*3/uL (ref 0.1–1.0)
Monocytes Relative: 7 %
Neutro Abs: 4.8 10*3/uL (ref 1.7–7.7)
Neutrophils Relative %: 69 %
Platelets: 232 10*3/uL (ref 150–400)
RBC: 4.57 MIL/uL (ref 3.87–5.11)
RDW: 13.9 % (ref 11.5–15.5)
WBC: 7 10*3/uL (ref 4.0–10.5)

## 2015-11-07 LAB — LIPASE, BLOOD: Lipase: 28 U/L (ref 11–51)

## 2015-11-07 MED ORDER — ONDANSETRON HCL 4 MG/2ML IJ SOLN
4.0000 mg | Freq: Once | INTRAMUSCULAR | Status: AC
  Administered 2015-11-07: 4 mg via INTRAVENOUS
  Filled 2015-11-07: qty 2

## 2015-11-07 MED ORDER — DIPHENHYDRAMINE HCL 50 MG/ML IJ SOLN
25.0000 mg | Freq: Once | INTRAMUSCULAR | Status: DC
  Filled 2015-11-07: qty 1

## 2015-11-07 MED ORDER — SODIUM CHLORIDE 0.9 % IV BOLUS (SEPSIS)
1000.0000 mL | Freq: Once | INTRAVENOUS | Status: AC
  Administered 2015-11-07: 1000 mL via INTRAVENOUS

## 2015-11-07 MED ORDER — ONDANSETRON HCL 4 MG PO TABS: 4.0000 mg | ORAL_TABLET | Freq: Four times a day (QID) | ORAL | 0 refills | Status: DC

## 2015-11-07 MED ORDER — PROMETHAZINE HCL 25 MG/ML IJ SOLN
25.0000 mg | Freq: Once | INTRAMUSCULAR | Status: AC
  Administered 2015-11-07: 25 mg via INTRAVENOUS
  Filled 2015-11-07: qty 1

## 2015-11-07 NOTE — ED Notes (Signed)
ED Provider at bedside. 

## 2015-11-07 NOTE — ED Triage Notes (Signed)
Pt presents from home c/o nausea and dizziness. Pt states that she has had 2 episodes of diarrhea per day for the last 2 days. Pt husband states that she hasnt been well or able to work since June. A&Ox4.

## 2015-11-07 NOTE — ED Provider Notes (Signed)
[facility]-EMERGENCY DEPT Provider Note   CSN: [id number] Arrival date & time: 11/07/15  1656   History   Chief Complaint Chief Complaint  Patient presents with  . Dizziness  . Nausea    HPI Lauren Bolton is a 55 y.o. female.  HPI    55 year old female presents today with complaints of nausea and vomiting. Pt reports similar symptoms that was worked up by gastroenterology with endoscopy with no significant findings. Pt reports that 2 years ago she started to have similar symptoms. She states that she has been seen in the emergency room and by pcp with no significant findings. She notes that symptoms worsened in June of this year with epigastric discomfort, nausea, vomiting, and intermittent diarrhea. She reports a recent change in thyroid medication; increasing dosing causing worsening n/v; Subsequently reduced. Husband notes that this has been a back-and-forth Battle, as they try and increase her thyroid medication makes her more nauseous and weak. She states she is able to tolerate sugar water with no vomiting. 1 episode of diarrhea today.   Husband reports that since June she has had this typical presentation. He notes things were slightly worsened this morning after taking Zoloft for the first time.     Past Medical History:  Diagnosis Date  . Iron deficiency anemia   . Thyroid disease     There are no active problems to display for this patient.   Past Surgical History:  Procedure Laterality Date  . thyroid goiter      OB History    No data available       Home Medications    Prior to Admission medications   Medication Sig Start Date End Date Taking? Authorizing Provider  ALPRAZolam (XANAX) 0.25 MG tablet Take 0.25 mg by mouth 3 (three) times daily. 10/23/15  Yes Historical Provider, MD  ibuprofen (ADVIL,MOTRIN) 200 MG tablet Take 200-400 mg by mouth every 6 (six) hours as needed for headache or moderate pain.   Yes Historical Provider, MD  levothyroxine (SYNTHROID,  LEVOTHROID) 75 MCG tablet Take 75 mcg by mouth daily before breakfast.   Yes Historical Provider, MD  omeprazole (PRILOSEC) 40 MG capsule Take 40 mg by mouth daily. 10/29/15  Yes Historical Provider, MD  POLY-IRON 150 150 MG capsule Take 150 mg by mouth daily. 09/22/15  Yes Historical Provider, MD  sertraline (ZOLOFT) 25 MG tablet Take 25 mg by mouth daily. 11/06/15  Yes Historical Provider, MD  ferrous sulfate 325 (65 FE) MG tablet Take 1 tablet (325 mg total) by mouth 3 (three) times daily with meals. Patient not taking: Reported on 11/07/2015 06/21/15   Jacalyn LefevreJulie Haviland, MD  ibuprofen (ADVIL,MOTRIN) 400 MG tablet Take 1 tablet (400 mg total) by mouth every 6 (six) hours as needed. Patient not taking: Reported on 11/07/2015 06/21/15   Jacalyn LefevreJulie Haviland, MD  ondansetron (ZOFRAN) 4 MG tablet Take 1 tablet (4 mg total) by mouth every 6 (six) hours. 11/07/15   Eyvonne MechanicJeffrey Pailynn Vahey, PA-C    Family History History reviewed. No pertinent family history.  Social History Social History  Substance Use Topics  . Smoking status: Never Smoker  . Smokeless tobacco: Not on file  . Alcohol use No     Allergies   Review of patient's allergies indicates no active allergies.   Review of Systems Review of Systems  All other systems reviewed and are negative.  Physical Exam Updated Vital Signs BP 178/94   Pulse 94   Temp 98.2 F (36.8 C) (Oral)   Resp  24   SpO2 100%   Physical Exam  Constitutional: She is oriented to person, place, and time. She appears well-developed and well-nourished.  HENT:  Head: Normocephalic and atraumatic.  Eyes: Conjunctivae are normal. Pupils are equal, round, and reactive to light. Right eye exhibits no discharge. Left eye exhibits no discharge. No scleral icterus.  Neck: Normal range of motion. No JVD present. No tracheal deviation present.  Cardiovascular: Regular rhythm.  Exam reveals no gallop and no friction rub.   No murmur heard. Pulmonary/Chest: Effort normal. No  stridor. No respiratory distress. She has no wheezes. She has no rales. She exhibits no tenderness.  Abdominal: Soft. She exhibits no distension and no mass. There is no tenderness. There is no rebound and no guarding. No hernia.  Neurological: She is alert and oriented to person, place, and time. Coordination normal.  Skin: Skin is warm and dry.  Psychiatric: She has a normal mood and affect. Her behavior is normal. Judgment and thought content normal.  Nursing note and vitals reviewed.    ED Treatments / Results  Labs (all labs ordered are listed, but only abnormal results are displayed) Labs Reviewed  CBC WITH DIFFERENTIAL/PLATELET - Abnormal; Notable for the following:       Result Value   Hemoglobin 11.2 (*)    HCT 33.9 (*)    MCV 74.2 (*)    MCH 24.5 (*)    All other components within normal limits  COMPREHENSIVE METABOLIC PANEL - Abnormal; Notable for the following:    Sodium 133 (*)    Potassium 3.2 (*)    Chloride 96 (*)    Glucose, Bld 102 (*)    BUN <5 (*)    Total Protein 8.2 (*)    All other components within normal limits  LIPASE, BLOOD    EKG  EKG Interpretation None       Radiology No results found.  Procedures Procedures (including critical care time)  Medications Ordered in ED Medications  diphenhydrAMINE (BENADRYL) injection 25 mg (25 mg Intravenous Not Given 11/07/15 1940)  sodium chloride 0.9 % bolus 1,000 mL (0 mLs Intravenous Stopped 11/07/15 2015)  ondansetron (ZOFRAN) injection 4 mg (4 mg Intravenous Given 11/07/15 1740)  promethazine (PHENERGAN) injection 25 mg (25 mg Intravenous Given 11/07/15 1902)     Initial Impression / Assessment and Plan / ED Course  I have reviewed the triage vital signs and the nursing notes.  Pertinent labs & imaging results that were available during my care of the patient were reviewed by me and considered in my medical decision making (see chart for details).  Clinical Course     Final Clinical  Impressions(s) / ED Diagnoses   Final diagnoses:  Non-intractable vomiting with nausea, unspecified vomiting type   Labs: cbc, cmp, lipase  Imaging:  Consults:  Therapeutics:  Discharge Meds:   Assessment/Plan:  55 year old female presents today with chronic nausea and vomiting. Patient had no episodes of vomiting while here in the ED. Patient's husband provides a significant portion of her history. Everything about today's presentation seems typical of her chronic ongoing issues for which she is being followed by primary care. She has reassuring vital signs, reassuring laboratory analysis here. Patient will be discharged home with primary care follow-up and strict return cautioned. Both patient and husband verbalized understanding and agreement to today's plan had no further questions or concerns at time of discharge.     New Prescriptions New Prescriptions   ONDANSETRON (ZOFRAN) 4 MG TABLET  Take 1 tablet (4 mg total) by mouth every 6 (six) hours.     Eyvonne Mechanic, PA-C 11/07/15 2030    Rolland Porter, MD 11/16/15 [phone]

## 2015-11-07 NOTE — Discharge Instructions (Signed)
Please read attached information. If you experience any new or worsening signs or symptoms please return to the emergency room for evaluation. Please follow-up with your primary care provider or specialist as discussed. Please use medication prescribed only as directed and discontinue taking if you have any concerning signs or symptoms.   °

## 2015-11-08 ENCOUNTER — Other Ambulatory Visit: Payer: Self-pay

## 2015-11-10 ENCOUNTER — Inpatient Hospital Stay (HOSPITAL_COMMUNITY)
Admission: EM | Admit: 2015-11-10 | Discharge: 2015-11-14 | DRG: 643 | Disposition: A | Discharge status: Home / Self Care | Payer: BLUE CROSS/BLUE SHIELD | Attending: Internal Medicine | Admitting: Internal Medicine

## 2015-11-10 ENCOUNTER — Encounter (HOSPITAL_COMMUNITY): Payer: Self-pay | Admitting: Emergency Medicine

## 2015-11-10 DIAGNOSIS — E43 Unspecified severe protein-calorie malnutrition: Secondary | ICD-10-CM | POA: Diagnosis present

## 2015-11-10 DIAGNOSIS — E034 Atrophy of thyroid (acquired): Secondary | ICD-10-CM | POA: Diagnosis not present

## 2015-11-10 DIAGNOSIS — E039 Hypothyroidism, unspecified: Secondary | ICD-10-CM | POA: Diagnosis not present

## 2015-11-10 DIAGNOSIS — Z791 Long term (current) use of non-steroidal anti-inflammatories (NSAID): Secondary | ICD-10-CM

## 2015-11-10 DIAGNOSIS — R627 Adult failure to thrive: Secondary | ICD-10-CM | POA: Diagnosis not present

## 2015-11-10 DIAGNOSIS — R1013 Epigastric pain: Secondary | ICD-10-CM | POA: Diagnosis not present

## 2015-11-10 DIAGNOSIS — R1110 Vomiting, unspecified: Secondary | ICD-10-CM

## 2015-11-10 DIAGNOSIS — D509 Iron deficiency anemia, unspecified: Secondary | ICD-10-CM | POA: Diagnosis present

## 2015-11-10 DIAGNOSIS — R638 Other symptoms and signs concerning food and fluid intake: Secondary | ICD-10-CM | POA: Diagnosis not present

## 2015-11-10 DIAGNOSIS — K9049 Malabsorption due to intolerance, not elsewhere classified: Secondary | ICD-10-CM | POA: Diagnosis not present

## 2015-11-10 DIAGNOSIS — R1012 Left upper quadrant pain: Secondary | ICD-10-CM | POA: Diagnosis not present

## 2015-11-10 DIAGNOSIS — Z681 Body mass index (BMI) 19 or less, adult: Secondary | ICD-10-CM

## 2015-11-10 DIAGNOSIS — F411 Generalized anxiety disorder: Secondary | ICD-10-CM | POA: Diagnosis present

## 2015-11-10 DIAGNOSIS — E890 Postprocedural hypothyroidism: Principal | ICD-10-CM | POA: Diagnosis present

## 2015-11-10 DIAGNOSIS — E876 Hypokalemia: Secondary | ICD-10-CM | POA: Diagnosis present

## 2015-11-10 DIAGNOSIS — R112 Nausea with vomiting, unspecified: Secondary | ICD-10-CM

## 2015-11-10 DIAGNOSIS — I10 Essential (primary) hypertension: Secondary | ICD-10-CM | POA: Diagnosis not present

## 2015-11-10 DIAGNOSIS — R5381 Other malaise: Secondary | ICD-10-CM | POA: Diagnosis present

## 2015-11-10 DIAGNOSIS — R5383 Other fatigue: Secondary | ICD-10-CM

## 2015-11-10 DIAGNOSIS — D508 Other iron deficiency anemias: Secondary | ICD-10-CM | POA: Diagnosis not present

## 2015-11-10 LAB — BASIC METABOLIC PANEL
Anion gap: 11 (ref 5–15)
CHLORIDE: 98 mmol/L — AB (ref 101–111)
CO2: 29 mmol/L (ref 22–32)
CREATININE: 0.76 mg/dL (ref 0.44–1.00)
Calcium: 9.7 mg/dL (ref 8.9–10.3)
GFR calc Af Amer: >60 mL/min (ref >60)
Glucose, Bld: 105 mg/dL — ABNORMAL HIGH (ref 65–99)
Potassium: 3.3 mmol/L — ABNORMAL LOW (ref 3.5–5.1)
SODIUM: 138 mmol/L (ref 135–145)

## 2015-11-10 LAB — URINALYSIS, ROUTINE W REFLEX MICROSCOPIC
BILIRUBIN URINE: NEGATIVE
Glucose, UA: NEGATIVE mg/dL
HGB URINE DIPSTICK: NEGATIVE
Ketones, ur: NEGATIVE mg/dL
Leukocytes, UA: NEGATIVE
Nitrite: NEGATIVE
PROTEIN: NEGATIVE mg/dL
Specific Gravity, Urine: 1.004 — ABNORMAL LOW (ref 1.005–1.030)
pH: 7 (ref 5.0–8.0)

## 2015-11-10 LAB — CBC WITH DIFFERENTIAL/PLATELET
Basophils Absolute: 0 10*3/uL (ref 0.0–0.1)
Basophils Relative: 0 %
EOS ABS: 0 10*3/uL (ref 0.0–0.7)
EOS PCT: 0 %
HCT: 34.2 % — ABNORMAL LOW (ref 36.0–46.0)
Hemoglobin: 11.6 g/dL — ABNORMAL LOW (ref 12.0–15.0)
LYMPHS ABS: 2.1 10*3/uL (ref 0.7–4.0)
Lymphocytes Relative: 30 %
MCH: 24.6 pg — AB (ref 26.0–34.0)
MCHC: 33.9 g/dL (ref 30.0–36.0)
MCV: 72.6 fL — ABNORMAL LOW (ref 78.0–100.0)
Monocytes Absolute: 0.5 10*3/uL (ref 0.1–1.0)
Monocytes Relative: 7 %
Neutro Abs: 4.4 10*3/uL (ref 1.7–7.7)
Neutrophils Relative %: 62 %
PLATELETS: 245 10*3/uL (ref 150–400)
RBC: 4.71 MIL/uL (ref 3.87–5.11)
RDW: 13.8 % (ref 11.5–15.5)
WBC: 7 10*3/uL (ref 4.0–10.5)

## 2015-11-10 LAB — TSH: TSH: 26.058 u[IU]/mL — AB (ref 0.350–4.500)

## 2015-11-10 MED ORDER — SODIUM CHLORIDE 0.9 % IV BOLUS (SEPSIS)
1000.0000 mL | Freq: Once | INTRAVENOUS | Status: AC
  Administered 2015-11-10: 1000 mL via INTRAVENOUS

## 2015-11-10 NOTE — ED Notes (Signed)
Warm blankets given to pt.  

## 2015-11-10 NOTE — ED Triage Notes (Signed)
Pt reports feeling unwell. When asked to elaborate pt said she felt shaky, tired, and cold. Occasional cough at night. Pt reports she generally feels the same as her last visit to the ED 3 days ago.

## 2015-11-10 NOTE — ED Notes (Signed)
No respiratory or acute distress noted alert and oriented x 3 call light in reach no reaction to medication noted family at bedside. 

## 2015-11-10 NOTE — ED Provider Notes (Signed)
[facility]-EMERGENCY DEPT Provider Note   CSN: [id number] Arrival date & time: 11/10/15  1840     History   Chief Complaint Chief Complaint  Patient presents with  . feels unwell    HPI Lauren Bolton is a 55 y.o. female.  HPI Patient's been feeling bad for the last few months. Seen in the ER 3 days ago for similar symptoms. Has felt dizzy and had nausea vomiting. Has been seen by her primary care doctor and apparently GI. They think that is her thyroid doing some of his symptoms. She had had increasing levels of her Synthroid but as a Synthroid dose Higher she had more nausea vomiting. Had a TSH checked about 3 weeks ago unaware of results with patient states that they increased her medicine after it and she was unable to tolerate the increase was only on for 4 days. States she's been doing worse. Not mentioned well at home.   Past Medical History:  Diagnosis Date  . Iron deficiency anemia   . Thyroid disease     Patient Active Problem List   Diagnosis Date Noted  . Hypothyroidism 11/10/2015    Past Surgical History:  Procedure Laterality Date  . thyroid goiter      OB History    No data available       Home Medications    Prior to Admission medications   Medication Sig Start Date End Date Taking? Authorizing Provider  ALPRAZolam (XANAX) 0.25 MG tablet Take 0.25 mg by mouth 3 (three) times daily. 10/23/15  Yes Historical Provider, MD  ibuprofen (ADVIL,MOTRIN) 400 MG tablet Take 1 tablet (400 mg total) by mouth every 6 (six) hours as needed. Patient taking differently: Take 400 mg by mouth every 6 (six) hours as needed for headache or mild pain.  06/21/15  Yes Jacalyn LefevreJulie Haviland, MD  levothyroxine (SYNTHROID, LEVOTHROID) 75 MCG tablet Take 75 mcg by mouth daily before breakfast.   Yes Historical Provider, MD  omeprazole (PRILOSEC) 40 MG capsule Take 40 mg by mouth daily. 10/29/15  Yes Historical Provider, MD  POLY-IRON 150 150 MG capsule Take 150 mg by mouth daily. 09/22/15   Yes Historical Provider, MD  ondansetron (ZOFRAN) 4 MG tablet Take 1 tablet (4 mg total) by mouth every 6 (six) hours. Patient not taking: Reported on 11/10/2015 11/07/15   Eyvonne MechanicJeffrey Hedges, PA-C    Family History History reviewed. No pertinent family history.  Social History Social History  Substance Use Topics  . Smoking status: Never Smoker  . Smokeless tobacco: Not on file  . Alcohol use No     Allergies   Patient has no known allergies.   Review of Systems Review of Systems  Constitutional: Positive for appetite change.  HENT: Negative for congestion.   Eyes: Negative for photophobia.  Respiratory: Positive for shortness of breath.   Cardiovascular: Negative for chest pain.  Gastrointestinal: Positive for diarrhea, nausea and vomiting.  Genitourinary: Negative for flank pain.  Musculoskeletal: Negative for back pain.  Neurological: Positive for tremors and weakness.  Psychiatric/Behavioral: Negative for confusion.     Physical Exam Updated Vital Signs BP 156/77 (BP Location: Right Arm)   Pulse 71   Temp 97.8 F (36.6 C) (Oral)   Resp 18   SpO2 100%   Physical Exam  Constitutional: She appears well-developed.  HENT:  Head: Atraumatic.  Mucous membranes are dry  Neck: Neck supple.  Cardiovascular: Normal rate.   Pulmonary/Chest: Effort normal.  Abdominal: There is no tenderness.  Neurological: She is  alert.  Skin: Skin is warm.  Psychiatric: She has a normal mood and affect.     ED Treatments / Results  Labs (all labs ordered are listed, but only abnormal results are displayed) Labs Reviewed  URINALYSIS, ROUTINE W REFLEX MICROSCOPIC (NOT AT [facility]) - Abnormal; Notable for the following:       Result Value   Specific Gravity, Urine 1.004 (*)    All other components within normal limits  BASIC METABOLIC PANEL - Abnormal; Notable for the following:    Potassium 3.3 (*)    Chloride 98 (*)    Glucose, Bld 105 (*)    BUN <5 (*)    All other components  within normal limits  CBC WITH DIFFERENTIAL/PLATELET - Abnormal; Notable for the following:    Hemoglobin 11.6 (*)    HCT 34.2 (*)    MCV 72.6 (*)    MCH 24.6 (*)    All other components within normal limits  TSH - Abnormal; Notable for the following:    TSH 26.058 (*)    All other components within normal limits    EKG  EKG Interpretation None       Radiology No results found.  Procedures Procedures (including critical care time)  Medications Ordered in ED Medications  sodium chloride 0.9 % bolus 1,000 mL (0 mLs Intravenous Stopped 11/10/15 2205)     Initial Impression / Assessment and Plan / ED Course  I have reviewed the triage vital signs and the nursing notes.  Pertinent labs & imaging results that were available during my care of the patient were reviewed by me and considered in my medical decision making (see chart for details).  Clinical Course     Patient with severe hypothyroidism. Has been able to tolerate orals well at home and has not been able tolerate increasing her thyroid medicine. TSH of 24. I do not know what the level was 3 weeks ago but this is fairly elevated. Will admit to internal medicine since she is not tolerating at home.  Final Clinical Impressions(s) / ED Diagnoses   Final diagnoses:  Hypothyroidism, unspecified type  Decreased oral intake    New Prescriptions New Prescriptions   No medications on file     Benjiman CoreNathan Taleyah Hillman, MD 11/10/15 2312

## 2015-11-11 ENCOUNTER — Encounter (HOSPITAL_COMMUNITY): Payer: Self-pay | Admitting: Family Medicine

## 2015-11-11 ENCOUNTER — Observation Stay (HOSPITAL_COMMUNITY): Payer: BLUE CROSS/BLUE SHIELD

## 2015-11-11 DIAGNOSIS — R638 Other symptoms and signs concerning food and fluid intake: Secondary | ICD-10-CM

## 2015-11-11 DIAGNOSIS — F411 Generalized anxiety disorder: Secondary | ICD-10-CM

## 2015-11-11 DIAGNOSIS — R627 Adult failure to thrive: Secondary | ICD-10-CM | POA: Diagnosis present

## 2015-11-11 DIAGNOSIS — R1110 Vomiting, unspecified: Secondary | ICD-10-CM | POA: Diagnosis not present

## 2015-11-11 DIAGNOSIS — E43 Unspecified severe protein-calorie malnutrition: Secondary | ICD-10-CM

## 2015-11-11 DIAGNOSIS — E039 Hypothyroidism, unspecified: Secondary | ICD-10-CM

## 2015-11-11 DIAGNOSIS — R5383 Other fatigue: Secondary | ICD-10-CM

## 2015-11-11 DIAGNOSIS — E890 Postprocedural hypothyroidism: Secondary | ICD-10-CM | POA: Diagnosis not present

## 2015-11-11 DIAGNOSIS — E876 Hypokalemia: Secondary | ICD-10-CM

## 2015-11-11 DIAGNOSIS — R112 Nausea with vomiting, unspecified: Secondary | ICD-10-CM | POA: Diagnosis not present

## 2015-11-11 DIAGNOSIS — D508 Other iron deficiency anemias: Secondary | ICD-10-CM | POA: Diagnosis not present

## 2015-11-11 DIAGNOSIS — R5381 Other malaise: Secondary | ICD-10-CM

## 2015-11-11 DIAGNOSIS — D509 Iron deficiency anemia, unspecified: Secondary | ICD-10-CM | POA: Diagnosis present

## 2015-11-11 DIAGNOSIS — K9049 Malabsorption due to intolerance, not elsewhere classified: Secondary | ICD-10-CM | POA: Diagnosis not present

## 2015-11-11 LAB — GLUCOSE, CAPILLARY: GLUCOSE-CAPILLARY: 90 mg/dL (ref 65–99)

## 2015-11-11 LAB — MAGNESIUM: Magnesium: 2.3 mg/dL (ref 1.7–2.4)

## 2015-11-11 LAB — BASIC METABOLIC PANEL
ANION GAP: 6 (ref 5–15)
BUN: <5 mg/dL — ABNORMAL LOW (ref 6–20)
CALCIUM: 9 mg/dL (ref 8.9–10.3)
CHLORIDE: 105 mmol/L (ref 101–111)
CO2: 28 mmol/L (ref 22–32)
Creatinine, Ser: 0.77 mg/dL (ref 0.44–1.00)
GFR calc non Af Amer: >60 mL/min (ref >60)
Glucose, Bld: 94 mg/dL (ref 65–99)
Potassium: 3.3 mmol/L — ABNORMAL LOW (ref 3.5–5.1)
SODIUM: 139 mmol/L (ref 135–145)

## 2015-11-11 LAB — RETICULOCYTES
RBC.: 4.29 MIL/uL (ref 3.87–5.11)
RETIC COUNT ABSOLUTE: 47.2 10*3/uL (ref 19.0–186.0)
RETIC CT PCT: 1.1 % (ref 0.4–3.1)

## 2015-11-11 LAB — IRON AND TIBC
IRON: 66 ug/dL (ref 28–170)
Saturation Ratios: 20 % (ref 10.4–31.8)
TIBC: 322 ug/dL (ref 250–450)
UIBC: 256 ug/dL

## 2015-11-11 LAB — FERRITIN: Ferritin: 530 ng/mL — ABNORMAL HIGH (ref 11–307)

## 2015-11-11 LAB — T4, FREE: FREE T4: 0.99 ng/dL (ref 0.61–1.12)

## 2015-11-11 LAB — FOLATE: Folate: 15.5 ng/mL (ref >5.9)

## 2015-11-11 LAB — VITAMIN B12: VITAMIN B 12: 629 pg/mL (ref 180–914)

## 2015-11-11 MED ORDER — HYDRALAZINE HCL 20 MG/ML IJ SOLN: 5.0000 mg | INTRAMUSCULAR | Status: DC | PRN

## 2015-11-11 MED ORDER — PANTOPRAZOLE SODIUM 40 MG PO TBEC
40.0000 mg | DELAYED_RELEASE_TABLET | Freq: Two times a day (BID) | ORAL | Status: DC
  Administered 2015-11-11 – 2015-11-14 (×6): 40 mg via ORAL
  Filled 2015-11-11 (×6): qty 1

## 2015-11-11 MED ORDER — ACETAMINOPHEN 650 MG RE SUPP: 650.0000 mg | Freq: Four times a day (QID) | RECTAL | Status: DC | PRN

## 2015-11-11 MED ORDER — ONDANSETRON HCL 4 MG/2ML IJ SOLN: 4.0000 mg | Freq: Four times a day (QID) | INTRAMUSCULAR | Status: DC | PRN

## 2015-11-11 MED ORDER — UNJURY CHICKEN SOUP POWDER
8.0000 [oz_av] | Freq: Three times a day (TID) | ORAL | Status: DC
  Administered 2015-11-11: 8 [oz_av] via ORAL
  Filled 2015-11-11 (×11): qty 27

## 2015-11-11 MED ORDER — POLYSACCHARIDE IRON COMPLEX 150 MG PO CAPS
150.0000 mg | ORAL_CAPSULE | Freq: Every day | ORAL | Status: DC
  Filled 2015-11-11 (×2): qty 1

## 2015-11-11 MED ORDER — SIMETHICONE 80 MG PO CHEW
160.0000 mg | CHEWABLE_TABLET | Freq: Four times a day (QID) | ORAL | Status: DC | PRN
  Administered 2015-11-11 – 2015-11-14 (×5): 160 mg via ORAL
  Filled 2015-11-11 (×5): qty 2

## 2015-11-11 MED ORDER — ONDANSETRON HCL 4 MG/2ML IJ SOLN
4.0000 mg | Freq: Four times a day (QID) | INTRAMUSCULAR | Status: DC
  Administered 2015-11-11 – 2015-11-14 (×9): 4 mg via INTRAVENOUS
  Filled 2015-11-11 (×10): qty 2

## 2015-11-11 MED ORDER — LEVOTHYROXINE SODIUM 100 MCG IV SOLR
75.0000 ug | Freq: Every day | INTRAVENOUS | Status: DC
  Administered 2015-11-11 – 2015-11-13 (×3): 75 ug via INTRAVENOUS
  Filled 2015-11-11 (×3): qty 5

## 2015-11-11 MED ORDER — ALPRAZOLAM 0.25 MG PO TABS
0.2500 mg | ORAL_TABLET | Freq: Three times a day (TID) | ORAL | Status: DC
  Administered 2015-11-11 – 2015-11-14 (×10): 0.25 mg via ORAL
  Filled 2015-11-11 (×10): qty 1

## 2015-11-11 MED ORDER — HYDROCODONE-ACETAMINOPHEN 5-325 MG PO TABS: 1.0000 | ORAL_TABLET | ORAL | Status: DC | PRN

## 2015-11-11 MED ORDER — ACETAMINOPHEN 325 MG PO TABS: 650.0000 mg | ORAL_TABLET | Freq: Four times a day (QID) | ORAL | Status: DC | PRN

## 2015-11-11 MED ORDER — ONDANSETRON HCL 4 MG PO TABS
4.0000 mg | ORAL_TABLET | Freq: Four times a day (QID) | ORAL | Status: DC
  Administered 2015-11-12 (×2): 4 mg via ORAL
  Filled 2015-11-11 (×3): qty 1

## 2015-11-11 MED ORDER — POTASSIUM CHLORIDE CRYS ER 10 MEQ PO TBCR
10.0000 meq | EXTENDED_RELEASE_TABLET | Freq: Once | ORAL | Status: DC
  Filled 2015-11-11: qty 1

## 2015-11-11 MED ORDER — ENOXAPARIN SODIUM 30 MG/0.3ML ~~LOC~~ SOLN
30.0000 mg | SUBCUTANEOUS | Status: DC
  Administered 2015-11-11 – 2015-11-13 (×3): 30 mg via SUBCUTANEOUS
  Filled 2015-11-11 (×4): qty 0.3

## 2015-11-11 MED ORDER — SODIUM CHLORIDE 0.9 % IV SOLN
INTRAVENOUS | Status: AC
  Filled 2015-11-11: qty 1000

## 2015-11-11 MED ORDER — IOPAMIDOL (ISOVUE-300) INJECTION 61%
15.0000 mL | Freq: Once | INTRAVENOUS | Status: DC | PRN
  Administered 2015-11-11: 15 mL via ORAL
  Filled 2015-11-11: qty 30

## 2015-11-11 MED ORDER — POTASSIUM CHLORIDE IN NACL 20-0.9 MEQ/L-% IV SOLN
INTRAVENOUS | Status: DC
  Administered 2015-11-11: 01:00:00 via INTRAVENOUS
  Filled 2015-11-11 (×2): qty 1000

## 2015-11-11 MED ORDER — IOPAMIDOL (ISOVUE-300) INJECTION 61%
100.0000 mL | Freq: Once | INTRAVENOUS | Status: AC | PRN
  Administered 2015-11-11: 80 mL via INTRAVENOUS

## 2015-11-11 MED ORDER — FAMOTIDINE IN NACL 20-0.9 MG/50ML-% IV SOLN
20.0000 mg | Freq: Two times a day (BID) | INTRAVENOUS | Status: DC
  Administered 2015-11-11 – 2015-11-14 (×7): 20 mg via INTRAVENOUS
  Filled 2015-11-11 (×7): qty 50

## 2015-11-11 MED ORDER — ONDANSETRON HCL 4 MG PO TABS: 4.0000 mg | ORAL_TABLET | Freq: Four times a day (QID) | ORAL | Status: DC | PRN

## 2015-11-11 MED ORDER — PANTOPRAZOLE SODIUM 40 MG PO TBEC
40.0000 mg | DELAYED_RELEASE_TABLET | Freq: Every day | ORAL | Status: DC
  Administered 2015-11-11: 40 mg via ORAL
  Filled 2015-11-11: qty 1

## 2015-11-11 MED ORDER — SODIUM CHLORIDE 0.9 % IV SOLN
INTRAVENOUS | Status: DC
  Administered 2015-11-11: 11:00:00 via INTRAVENOUS
  Filled 2015-11-11 (×2): qty 1000

## 2015-11-11 MED ORDER — ENSURE ENLIVE PO LIQD
237.0000 mL | Freq: Two times a day (BID) | ORAL | Status: DC
Start: 2015-11-11 — End: 2015-11-11
  Administered 2015-11-11: 237 mL via ORAL

## 2015-11-11 NOTE — Progress Notes (Signed)
Pt admitted to 1344 accompanied by husband. Oriented to room and reviewed plan of care. Pt and husband with many questions about medications and symptoms, answered to the best of my ability. Pt with steady ambulation to BR. Continue to monitor. Mick SellShannon Samari Bittinger RN

## 2015-11-11 NOTE — Progress Notes (Signed)
Initial Nutrition Assessment  DOCUMENTATION CODES:   Severe malnutrition in context of acute illness/injury, Underweight  INTERVENTION:   Monitor magnesium, potassium, and phosphorus daily for at least 3 days, MD to replete as needed, as pt is at risk for refeeding syndrome given poor PO intake x 2-4 months per pt's husband, low K level, and severe malnutrition status.  D/c Ensure per pt request Provide Unjury Chicken Soup supplements TID, each provides 100 kcal and 21g protein RD to continue to monitor  NUTRITION DIAGNOSIS:   Inadequate oral intake related to nausea, vomiting as evidenced by per patient/family report.  GOAL:   Patient will meet greater than or equal to 90% of their needs  MONITOR:   PO intake, Supplement acceptance, Labs, Weight trends, I & O's  REASON FOR ASSESSMENT:   Consult Assessment of nutrition requirement/status  ASSESSMENT:   55 y.o. female with medical history significant for hypothyroidism, iron deficiency anemia, and anxiety who presents to the emergency department with Hand-standing and progressive chills, fatigue, malaise, and intolerance of oral intake. Patient had a goiter removed in 2012 and has since been under treatment for hypothyroidism. She was reportedly doing okay until June of this year when she developed malaise and fatigue with skin changes and nausea.   Patient in room with husband at bedside. Pt's husband with many questions about what may be causing pt's persistent nausea. Pt's husband states she has only been tolerating a rice soup at home. He has brought some from home for patient. Pt ordered a breakfast tray and she ate 1 bite of a pancake and no more. Pt was given Zofran dose prior to visit. Advised pt to wait around 30 minutes then try to eat some crackers or other soft foods. Pt interested in receiving Unjury chicken soup supplement versus Ensure supplements. Pt does not want anything sweet at this time. Pt's nausea has lasted  for the past 2-4 months, per pt's husband.   No weight history available in EPIC. Pt with moderate muscle depletion in clavicle, temporal and hand regions.   Medications: Niferex capsule daily, IV Zofran every 6 hours, K-DUR tablet once  Labs reviewed: Low K Mg WNL  Diet Order:  Diet regular Room service appropriate? Yes; Fluid consistency: Thin  Skin:  Reviewed, no issues  Last BM:  11/5  Height:   Ht Readings from Last 1 Encounters:  11/11/15 5' (1.524 m)    Weight:   Wt Readings from Last 1 Encounters:  11/11/15 92 lb (41.7 kg)    Ideal Body Weight:  45.5 kg  BMI:  Body mass index is 17.97 kg/m.  Estimated Nutritional Needs:   Kcal:  1250-1450  Protein:  60-70g  Fluid:  1.5L/day  EDUCATION NEEDS:   Education needs addressed  Tilda FrancoLindsey Shella Lahman, MS, RD, LDN Pager: [phone] After Hours Pager: [phone]

## 2015-11-11 NOTE — Progress Notes (Signed)
PROGRESS NOTE    Lindsy Price  YNW:[id number] DOB: 11-23-1960 DOA: 11/10/2015  PCP: Julian HySOUTH,STEPHEN ALAN, MD   Brief Narrative:  Lauren Bolton is a 55 y.o. female with medical history significant for hypothyroidism, iron deficiency anemia, and anxiety who presents to the emergency department with 5 months of vomiting, burning in her chest, gas, burping, progressive chills, severe fatigue and malaise. She has been taking Omeprazole daily. She was given a referral to see a GI doctor as outpt but was too weak to go. She had and endoscopy about 5 yrs ago by Dr Bosie ClosSchooler which did no show any abnormalities. She is hypothryoid as well and is taking Synthroid but unable to tolerate increasing dose beyond 75 mcg due to worsening GI symptoms.   Subjective: Nauseated. Currently no heartburn or pain. Very weak.   Assessment & Plan:   Principal Problem:   Nausea with vomiting, heartburn, indigestion - likely PUD- LFTs Lipase normal- will obtain CT abdomen and GI consult - BID Protonix & Pepcid '  Active Problems:   Hypothyroidism TSH       right;">26.058<=0.01  >        T4,Free(Direct)        0.99      - Iv Synthroid for now      Hypokalemia - replacing- Mg normal    Iron deficiency anemia - hold oral Iron for now- check anemia panel    Malaise and fatigue/   Protein-calorie malnutrition, severe - nutritional supplements added     Anxiety state - PRN Xanax  HTN - PRN Hydralazine for now- follow BP    DVT prophylaxis: Lovenox Code Status: Full code Family Communication: husband Disposition Plan: home when stable Consultants:   GI   Procedures:    Antimicrobials:  Anti-infectives    None       Objective: Vitals:   11/10/15 2256 11/10/15 2350 11/11/15 0015 11/11/15 0520  BP: 156/77 148/91 (!) 163/88 (!) 151/82  Pulse: 71 67 74 (!) 20  Resp: 18 18 17 20   Temp:   98.2 F (36.8 C) 98.2 F (36.8 C)  TempSrc:   Oral Oral  SpO2: 100% 100% 100% 100%  Weight:   41.7 kg  (92 lb)   Height:   5' (1.524 m)    No intake or output data in the 24 hours ending 11/11/15 1044 Filed Weights   11/11/15 0015  Weight: 41.7 kg (92 lb)    Examination: General exam: thin female laying in bed, Appears uncomfortable  HEENT: PERRLA, oral mucosa moist, no sclera icterus or thrush Respiratory system: Clear to auscultation. Respiratory effort normal. Cardiovascular system: S1 & S2 heard, RRR.  No murmurs  Gastrointestinal system: Abdomen soft, non-tender, nondistended. Normal bowel sound. No organomegaly Central nervous system: Alert and oriented. No focal neurological deficits. Extremities: No cyanosis, clubbing or edema Skin: No rashes or ulcers Psychiatry:  Mood & affect appropriate.     Data Reviewed: I have personally reviewed following labs and imaging studies  CBC:  Recent Labs Lab 11/07/15 1737 11/10/15 2052  WBC 7.0 7.0  NEUTROABS 4.8 4.4  HGB 11.2* 11.6*  HCT 33.9* 34.2*  MCV 74.2* 72.6*  PLT 232 245   Basic Metabolic Panel:  Recent Labs Lab 11/07/15 1737 11/10/15 2052 11/10/15 2104 11/11/15 0047  NA 133* 138  --  139  K 3.2* 3.3*  --  3.3*  CL 96* 98*  --  105  CO2 27 29  --  28  GLUCOSE 102* 105*  --  94  BUN <5* <5*  --  <5*  CREATININE 0.79 0.76  --  0.77  CALCIUM 9.3 9.7  --  9.0  MG  --   --  2.3  --    GFR: Estimated Creatinine Clearance: 52.9 mL/min (by C-G formula based on SCr of 0.77 mg/dL). Liver Function Tests:  Recent Labs Lab 11/07/15 1737  AST 30  ALT 20  ALKPHOS 69  BILITOT 0.9  PROT 8.2*  ALBUMIN 4.7    Recent Labs Lab 11/07/15 1737  LIPASE 28   No results for input(s): AMMONIA in the last 168 hours. Coagulation Profile: No results for input(s): INR, PROTIME in the last 168 hours. Cardiac Enzymes: No results for input(s): CKTOTAL, CKMB, CKMBINDEX, TROPONINI in the last 168 hours. BNP (last 3 results) No results for input(s): PROBNP in the last 8760 hours. HbA1C: No results for input(s): HGBA1C in  the last 72 hours. CBG:  Recent Labs Lab 11/11/15 0835  GLUCAP 90   Lipid Profile: No results for input(s): CHOL, HDL, LDLCALC, TRIG, CHOLHDL, LDLDIRECT in the last 72 hours. Thyroid Function Tests:  Recent Labs  11/10/15 2053 11/11/15 0046  TSH 26.058*  --   FREET4  --  0.99   Anemia Panel: No results for input(s): VITAMINB12, FOLATE, FERRITIN, TIBC, IRON, RETICCTPCT in the last 72 hours. Urine analysis:    Component Value Date/Time   COLORURINE YELLOW 11/10/2015 1912   APPEARANCEUR CLEAR 11/10/2015 1912   LABSPEC 1.004 (L) 11/10/2015 1912   PHURINE 7.0 11/10/2015 1912   GLUCOSEU NEGATIVE 11/10/2015 1912   HGBUR NEGATIVE 11/10/2015 1912   BILIRUBINUR NEGATIVE 11/10/2015 1912   KETONESUR NEGATIVE 11/10/2015 1912   PROTEINUR NEGATIVE 11/10/2015 1912   UROBILINOGEN 0.2 03/17/2010 1421   NITRITE NEGATIVE 11/10/2015 1912   LEUKOCYTESUR NEGATIVE 11/10/2015 1912   Sepsis Labs: @LABRCNTIP (procalcitonin:4,lacticidven:4) )No results found for this or any previous visit (from the past 240 hour(s)).       Radiology Studies: No results found.    Scheduled Meds: . ALPRAZolam  0.25 mg Oral TID  . enoxaparin (LOVENOX) injection  30 mg Subcutaneous Q24H  . iron polysaccharides  150 mg Oral Daily  . levothyroxine  75 mcg Intravenous QAC breakfast  . ondansetron (ZOFRAN) IV  4 mg Intravenous Q6H   Or  . ondansetron  4 mg Oral Q6H  . pantoprazole  40 mg Oral BID  . potassium chloride  10 mEq Oral Once  . protein supplement  8 oz Oral TID   Continuous Infusions: . 0.9 % sodium chloride with kcl       LOS: 0 days    Time spent in minutes: 35    Lovina Zuver, MD [facility] Pager: [email or web address] Password [email or web address] 11/11/2015, 10:44 AM

## 2015-11-11 NOTE — Evaluation (Signed)
Physical Therapy Evaluation Patient Details Name: Lauren Bolton MRN: [id number] DOB: 10/31/1960 Today's Date: 11/11/2015   History of Present Illness  55 yo female admitted with hypothyroidism, nausea, loss of appetite, dizziness. Hx of chronic GI issues, anemia, anxiety, hypothyroidism  Clinical Impression  On eval, pt was Min guard assist for mobility. She agreed to walk a short distance with therapist. Pt tolerated short distance well however she c/o general fatigue, lack of energy. Pt and husband report this has been going on for months now. Pt is frustrated and was tearful during session. Encouraged pt/husband to continue to speak with MD about situation. Will follow on a limited basis. Recommended to pt that she walk with family or nursing as tolerated throughout the day.     Follow Up Recommendations No PT follow up;Supervision for mobility/OOB    Equipment Recommendations  None recommended by PT    Recommendations for Other Services       Precautions / Restrictions Restrictions Weight Bearing Restrictions: No      Mobility  Bed Mobility Overal bed mobility: Modified Independent                Transfers Overall transfer level: Modified independent                  Ambulation/Gait Ambulation/Gait assistance: Min guard Ambulation Distance (Feet): 90 Feet Assistive device: None Gait Pattern/deviations: Step-through pattern     General Gait Details: close guard for safety. Pt c/o feeling weak/ no energy/tired. She tolerated distance well.   Stairs            Wheelchair Mobility    Modified Rankin (Stroke Patients Only)       Balance Overall balance assessment: No apparent balance deficits (not formally assessed)                                           Pertinent Vitals/Pain Pain Assessment: No/denies pain    Home Living Family/patient expects to be discharged to:: Private residence Living Arrangements:  Spouse/significant other;Children Available Help at Discharge: Family Type of Home: House Home Access: Level entry     Home Layout: One level Home Equipment: None      Prior Function Level of Independence: Independent               Hand Dominance        Extremity/Trunk Assessment   Upper Extremity Assessment: Generalized weakness           Lower Extremity Assessment: Generalized weakness      Cervical / Trunk Assessment: Normal  Communication   Communication: No difficulties  Cognition Arousal/Alertness: Awake/alert Behavior During Therapy: Flat affect Overall Cognitive Status: Within Functional Limits for tasks assessed                      General Comments      Exercises     Assessment/Plan    PT Assessment Patient needs continued PT services  PT Problem List Decreased mobility;Decreased activity tolerance          PT Treatment Interventions Gait training;Therapeutic activities;Therapeutic exercise;Patient/family education;Functional mobility training    PT Goals (Current goals can be found in the Care Plan section)  Acute Rehab PT Goals Patient Stated Goal: to find out why she can't eat/no appetite/so tired PT Goal Formulation: With patient/family Time For Goal Achievement: 11/25/15 Potential to  Achieve Goals: Good    Frequency Min 2X/week   Barriers to discharge        Co-evaluation               End of Session Equipment Utilized During Treatment: Gait belt Activity Tolerance: Patient limited by fatigue Patient left: in bed;with call bell/phone within reach;with family/visitor present      Functional Assessment Tool Used: clinical judgement Functional Limitation: Mobility: Walking and moving around Mobility: Walking and Moving Around Current Status ([id number]): At least 1 percent but less than 20 percent impaired, limited or restricted Mobility: Walking and Moving Around Goal Status [phone]): At least 1 percent but less  than 20 percent impaired, limited or restricted    Time: 1007-1016 PT Time Calculation (min) (ACUTE ONLY): 9 min   Charges:   PT Evaluation $PT Eval Low Complexity: 1 Procedure     PT G Codes:   PT G-Codes **NOT FOR INPATIENT CLASS** Functional Assessment Tool Used: clinical judgement Functional Limitation: Mobility: Walking and moving around Mobility: Walking and Moving Around Current Status ([id number](G8978): At least 1 percent but less than 20 percent impaired, limited or restricted Mobility: Walking and Moving Around Goal Status [phone]([id number]): At least 1 percent but less than 20 percent impaired, limited or restricted    Rebeca AlertJannie Zaidyn Claire, MPT Pager: [phone]

## 2015-11-11 NOTE — H&P (Signed)
History and Physical    Lauren Bolton:[id number] DOB: 06-02-1960 DOA: 11/10/2015  PCP: Sheela Stack, MD   Patient coming from: Home  Chief Complaint: Chills, fatigue, refusal of food and drink  HPI: Lauren Bolton is a 55 y.o. female with medical history significant for hypothyroidism, iron deficiency anemia, and anxiety who presents to the emergency department with [place]-standing and progressive chills, fatigue, malaise, and intolerance of oral intake. Patient had a goiter removed in 2012 and has since been under treatment for hypothyroidism. She was reportedly doing okay until June of this year when she developed malaise and fatigue with skin changes and nausea. TSH was noted to be elevated at that time and she had her Synthroid increased. Unfortunately, she reports being unable to tolerate the increased dose due to weakness and vomiting and so the dose was reduced. Since that time, she has attempted to increase her Synthroid multiple times, but each attempt is met with worsening in her weakness and nausea. She has been eating a small bowl of watery rice each day for the past couple months and this is about all she can tolerate per the report of the patient and her husband. She is unable to keep the pills down consistently. She denies any recent fevers, but notes chills. There has been no dyspnea or cough and she is not vomiting and does not have diarrhea. There has been no confusion or focal numbness or weakness.  ED Course: Upon arrival to the ED, patient is found to be afebrile, saturating adequately on room air, and with vital signs stable. Chemistry panels notable for potassium of 3.3 and CBC features a stable microcytic anemia with hemoglobin of 11.6 and MCV of 72.6. TSH returned markedly elevated to a value of 26.058. Urinalysis is notable only for a decreased specific gravity. Patient was given a liter of normal saline in the emergency department and remained hemodynamically stable and  in no respiratory distress. Given her intolerance for oral intake, patient will be observed on medical surgical unit for ongoing evaluation and management of her hypothyroidism with worsening chills, malaise, and fatigue.  Review of Systems:  All other systems reviewed and apart from HPI, are negative.  Past Medical History:  Diagnosis Date  . Iron deficiency anemia   . Thyroid disease     Past Surgical History:  Procedure Laterality Date  . thyroid goiter       reports that she has never smoked. She does not have any smokeless tobacco history on file. She reports that she does not drink alcohol or use drugs.  No Known Allergies  History reviewed. No pertinent family history.   Prior to Admission medications   Medication Sig Start Date End Date Taking? Authorizing Provider  ALPRAZolam (XANAX) 0.25 MG tablet Take 0.25 mg by mouth 3 (three) times daily. 10/23/15  Yes Historical Provider, MD  ibuprofen (ADVIL,MOTRIN) 400 MG tablet Take 1 tablet (400 mg total) by mouth every 6 (six) hours as needed. Patient taking differently: Take 400 mg by mouth every 6 (six) hours as needed for headache or mild pain.  06/21/15  Yes Isla Pence, MD  levothyroxine (SYNTHROID, LEVOTHROID) 75 MCG tablet Take 75 mcg by mouth daily before breakfast.   Yes Historical Provider, MD  omeprazole (PRILOSEC) 40 MG capsule Take 40 mg by mouth daily. 10/29/15  Yes Historical Provider, MD  POLY-IRON 150 150 MG capsule Take 150 mg by mouth daily. 09/22/15  Yes Historical Provider, MD  ondansetron (ZOFRAN) 4 MG tablet Take 1  tablet (4 mg total) by mouth every 6 (six) hours. Patient not taking: Reported on 11/10/2015 11/07/15   Okey Regal, PA-C    Physical Exam: Vitals:   11/10/15 2127 11/10/15 2156 11/10/15 2256 11/10/15 2350  BP: 151/87 173/63 156/77 148/91  Pulse: 74 70 71 67  Resp: _0 Temp: 97.8 F (36.6 C)     TempSrc: Oral     SpO2: 100% 100% 100% 100%      Constitutional: NAD, calm,  comfortable, cachectic Eyes: PERTLA, lids and conjunctivae normal ENMT: Mucous membranes are dry. Posterior pharynx clear of any exudate or lesions.   Neck: normal, supple, no masses, no thyromegaly Respiratory: clear to auscultation bilaterally, no wheezing, no crackles. Normal respiratory effort.    Cardiovascular: S1 & S2 heard, regular rate and rhythm. No extremity edema. No significant JVD. Abdomen: No distension, no tenderness, no masses palpated. Bowel sounds normal.  Musculoskeletal: no clubbing / cyanosis. No joint deformity upper and lower extremities. Normal muscle tone.  Skin: no significant rashes, lesions, ulcers. Warm, dry, well-perfused.  Neurologic: CN 2-12 grossly intact. Sensation intact, DTR normal. Strength 5/5 in all 4 limbs.  Psychiatric: Normal judgment and insight. Alert and oriented x 3. Normal mood and affect.     Labs on Admission: I have personally reviewed following labs and imaging studies  CBC:  Recent Labs Lab 11/07/15 1737 11/10/15 2052  WBC 7.0 7.0  NEUTROABS 4.8 4.4  HGB 11.2* 11.6*  HCT 33.9* 34.2*  MCV 74.2* 72.6*  PLT 232 315   Basic Metabolic Panel:  Recent Labs Lab 11/07/15 1737 11/10/15 2052  NA 133* 138  K 3.2* 3.3*  CL 96* 98*  CO2 27 29  GLUCOSE 102* 105*  BUN <5* <5*  CREATININE 0.79 0.76  CALCIUM 9.3 9.7   GFR: CrCl cannot be calculated (Unknown ideal weight.). Liver Function Tests:  Recent Labs Lab 11/07/15 1737  AST 30  ALT 20  ALKPHOS 69  BILITOT 0.9  PROT 8.2*  ALBUMIN 4.7    Recent Labs Lab 11/07/15 1737  LIPASE 28   No results for input(s): AMMONIA in the last 168 hours. Coagulation Profile: No results for input(s): INR, PROTIME in the last 168 hours. Cardiac Enzymes: No results for input(s): CKTOTAL, CKMB, CKMBINDEX, TROPONINI in the last 168 hours. BNP (last 3 results) No results for input(s): PROBNP in the last 8760 hours. HbA1C: No results for input(s): HGBA1C in the last 72  hours. CBG: No results for input(s): GLUCAP in the last 168 hours. Lipid Profile: No results for input(s): CHOL, HDL, LDLCALC, TRIG, CHOLHDL, LDLDIRECT in the last 72 hours. Thyroid Function Tests:  Recent Labs  11/10/15 2053  TSH 26.058*   Anemia Panel: No results for input(s): VITAMINB12, FOLATE, FERRITIN, TIBC, IRON, RETICCTPCT in the last 72 hours. Urine analysis:    Component Value Date/Time   COLORURINE YELLOW 11/10/2015 1912   APPEARANCEUR CLEAR 11/10/2015 1912   LABSPEC 1.004 (L) 11/10/2015 1912   PHURINE 7.0 11/10/2015 1912   GLUCOSEU NEGATIVE 11/10/2015 1912   HGBUR NEGATIVE 11/10/2015 [place] NEGATIVE 11/10/2015 1912   KETONESUR NEGATIVE 11/10/2015 1912   PROTEINUR NEGATIVE 11/10/2015 1912   UROBILINOGEN 0.2 03/17/2010 1421   NITRITE NEGATIVE 11/10/2015 1912   LEUKOCYTESUR NEGATIVE 11/10/2015 1912   Sepsis Labs: _1 (procalcitonin:4,lacticidven:4) )No results found for this or any previous visit (from the past 240 hour(s)).   Radiological Exams on Admission: No results found.  EKG: Not performed, will obtain as appropriate  Assessment/Plan  1. Hypothyroidism, postoperative   - Pt is symptomatic with chills, malaise, fatigue, skin changes  - TSH is 26 on admission  - Pt reports inability to tolerate higher doses of Synthroid, stating that it exacerbates her nausea, vomiting, and weakness  - She is followed by endocrinologist, Dr. Forde Dandy, who has attempted to increase her Synthroid dose, but she reports worsening with doses above 75 mcg - Given her intolerance of oral intake, she will be observed on med-surg for IVF - Levothyroxine will be given IV while in hospital  - Dietary consultation requested; pt has tried Boost and Ensure, but reports intolerance to any milk product   2. Nausea, loss of appetite - Likely secondary to hypothyroidism and decreased GI motility  - She is being hydrated with IVF  - Dietary consultation requested   3.  Hypokalemia  - Serum potassium 3.3 on admission  - Magnesium level 2.3  - KCl is added to her IVF  - Repeat chem panel in am   4. Iron-deficiency anemia  - Hgb is 11.6 on admission with MCV 72.6 - Stable relative to priors with no sign of bleeding  - Continue iron-supplementation as tolerated   5. Anxiety   - Continue home Xanax prn    DVT prophylaxis: sq Lovenox Code Status: Full  Family Communication: Husband updated at bedside Disposition Plan: Observe on med-surg Consults called: None Admission status: Observation    Vianne Bulls, MD [facility] Pager [phone]  If 7PM-7AM, please contact night-coverage [email or web address] Password [email or web address]  11/11/2015, 12:22 AM

## 2015-11-12 DIAGNOSIS — R627 Adult failure to thrive: Secondary | ICD-10-CM

## 2015-11-12 DIAGNOSIS — E890 Postprocedural hypothyroidism: Secondary | ICD-10-CM | POA: Diagnosis present

## 2015-11-12 DIAGNOSIS — D509 Iron deficiency anemia, unspecified: Secondary | ICD-10-CM | POA: Diagnosis not present

## 2015-11-12 DIAGNOSIS — Z681 Body mass index (BMI) 19 or less, adult: Secondary | ICD-10-CM | POA: Diagnosis not present

## 2015-11-12 DIAGNOSIS — E034 Atrophy of thyroid (acquired): Secondary | ICD-10-CM | POA: Diagnosis not present

## 2015-11-12 DIAGNOSIS — Z791 Long term (current) use of non-steroidal anti-inflammatories (NSAID): Secondary | ICD-10-CM | POA: Diagnosis not present

## 2015-11-12 DIAGNOSIS — R1013 Epigastric pain: Secondary | ICD-10-CM | POA: Diagnosis not present

## 2015-11-12 DIAGNOSIS — E43 Unspecified severe protein-calorie malnutrition: Secondary | ICD-10-CM | POA: Diagnosis present

## 2015-11-12 DIAGNOSIS — F411 Generalized anxiety disorder: Secondary | ICD-10-CM | POA: Diagnosis not present

## 2015-11-12 DIAGNOSIS — R1012 Left upper quadrant pain: Secondary | ICD-10-CM | POA: Diagnosis not present

## 2015-11-12 DIAGNOSIS — R638 Other symptoms and signs concerning food and fluid intake: Secondary | ICD-10-CM | POA: Diagnosis not present

## 2015-11-12 DIAGNOSIS — I10 Essential (primary) hypertension: Secondary | ICD-10-CM | POA: Diagnosis present

## 2015-11-12 DIAGNOSIS — E039 Hypothyroidism, unspecified: Secondary | ICD-10-CM | POA: Diagnosis not present

## 2015-11-12 DIAGNOSIS — R112 Nausea with vomiting, unspecified: Secondary | ICD-10-CM | POA: Diagnosis not present

## 2015-11-12 DIAGNOSIS — E876 Hypokalemia: Secondary | ICD-10-CM | POA: Diagnosis present

## 2015-11-12 LAB — CBC
HEMATOCRIT: 27.3 % — AB (ref 36.0–46.0)
HEMOGLOBIN: 9.2 g/dL — AB (ref 12.0–15.0)
MCH: 25.4 pg — ABNORMAL LOW (ref 26.0–34.0)
MCHC: 33.7 g/dL (ref 30.0–36.0)
MCV: 75.4 fL — ABNORMAL LOW (ref 78.0–100.0)
Platelets: 206 10*3/uL (ref 150–400)
RBC: 3.62 MIL/uL — AB (ref 3.87–5.11)
RDW: 14.4 % (ref 11.5–15.5)
WBC: 8 10*3/uL (ref 4.0–10.5)

## 2015-11-12 LAB — BASIC METABOLIC PANEL
ANION GAP: 6 (ref 5–15)
BUN: 9 mg/dL (ref 6–20)
CO2: 25 mmol/L (ref 22–32)
Calcium: 8.9 mg/dL (ref 8.9–10.3)
Chloride: 106 mmol/L (ref 101–111)
Creatinine, Ser: 1.04 mg/dL — ABNORMAL HIGH (ref 0.44–1.00)
GFR, EST NON AFRICAN AMERICAN: 60 mL/min — AB (ref >60)
GLUCOSE: 80 mg/dL (ref 65–99)
POTASSIUM: 4.6 mmol/L (ref 3.5–5.1)
Sodium: 137 mmol/L (ref 135–145)

## 2015-11-12 LAB — GLUCOSE, CAPILLARY: Glucose-Capillary: 88 mg/dL (ref 65–99)

## 2015-11-12 MED ORDER — SODIUM CHLORIDE 0.9 % IV SOLN
INTRAVENOUS | Status: DC
  Administered 2015-11-12 – 2015-11-14 (×2): via INTRAVENOUS

## 2015-11-12 NOTE — Progress Notes (Addendum)
PROGRESS NOTE    Lauren Bolton  ZOX:[id number] DOB: 01-25-1960 DOA: 11/10/2015  PCP: Julian HySOUTH,STEPHEN ALAN, MD   Brief Narrative:  Lauren Bolton is a 55 y.o. female with medical history significant for hypothyroidism, iron deficiency anemia, and anxiety who presents to the emergency department with 5 months of vomiting, burning in her chest, gas, burping, progressive chills, severe fatigue and malaise. She has been taking Omeprazole daily. She was given a referral to see a GI doctor as outpt but was too weak to go. She had and endoscopy about 5 yrs ago by Dr Bosie ClosSchooler which did not show any abnormalities. She is hypothryoid as well and is taking Synthroid.  Subjective: Has some nausea. No other complaints.   Assessment & Plan:   Principal Problem:   Nausea with vomiting, heartburn, indigestion - likely PUD- LFTs Lipase normal- - CT abdomen unrevealing - GI consulted- plan for EGD possibly today - BID Protonix & Pepcid   Active Problems:   Hypothyroidism TSH       right;">26.058<=0.01  >        T4,Free(Direct)        0.99    - levels were normal in June - IV Synthroid for now  - may not have been tolerating her synthroid ? Vomiting - her PCP Dr Evlyn [facility] has given her a few short courses over the summer (5-7 days) of extra synthroid    Hypokalemia - replaced- Mg normal    Iron deficiency anemia - hold oral Iron for now due to GI symptoms - checked anemia panel- has normal Iron, folate and B12 levels    Malaise and fatigue/   Protein-calorie malnutrition, severe - nutritional supplements added    Anxiety state - PRN Xanax  HTN - PRN Hydralazine for now- follow BP which is fluctuating    DVT prophylaxis: Lovenox Code Status: Full code Family Communication: husband Disposition Plan: home when stable Consultants:   GI   Procedures:    Antimicrobials:  Anti-infectives    None       Objective: Vitals:   11/11/15 0520 11/11/15 1535 11/11/15 2232 11/12/15 0550  BP:  (!) 151/82 137/82 118/73 110/73  Pulse: (!) 20 67 63 (!) 58  Resp: 20 18 14 16   Temp: 98.2 F (36.8 C) 99 F (37.2 C) 98.7 F (37.1 C) 98.1 F (36.7 C)  TempSrc: Oral Oral Oral Oral  SpO2: 100% 100% 100% 100%  Weight:      Height:        Intake/Output Summary (Last 24 hours) at 11/12/15 1203 Last data filed at 11/12/15 0900  Gross per 24 hour  Intake               50 ml  Output                0 ml  Net               50 ml   Filed Weights   11/11/15 0015  Weight: 41.7 kg (92 lb)    Examination: General exam: thin female laying in bed, Appears uncomfortable  HEENT: PERRLA, oral mucosa moist, no sclera icterus or thrush Respiratory system: Clear to auscultation. Respiratory effort normal. Cardiovascular system: S1 & S2 heard, RRR.  No murmurs  Gastrointestinal system: Abdomen soft, non-tender, nondistended. Normal bowel sound. No organomegaly Central nervous system: Alert and oriented. No focal neurological deficits. Extremities: No cyanosis, clubbing or edema Skin: No rashes or ulcers Psychiatry:  Mood & affect appropriate.  Data Reviewed: I have personally reviewed following labs and imaging studies  CBC:  Recent Labs Lab 11/07/15 1737 11/10/15 2052 11/12/15 0348  WBC 7.0 7.0 8.0  NEUTROABS 4.8 4.4  --   HGB 11.2* 11.6* 9.2*  HCT 33.9* 34.2* 27.3*  MCV 74.2* 72.6* 75.4*  PLT 232 245 206   Basic Metabolic Panel:  Recent Labs Lab 11/07/15 1737 11/10/15 2052 11/10/15 2104 11/11/15 0047 11/12/15 0348  NA 133* 138  --  139 137  K 3.2* 3.3*  --  3.3* 4.6  CL 96* 98*  --  105 106  CO2 27 29  --  28 25  GLUCOSE 102* 105*  --  94 80  BUN <5* <5*  --  <5* 9  CREATININE 0.79 0.76  --  0.77 1.04*  CALCIUM 9.3 9.7  --  9.0 8.9  MG  --   --  2.3  --   --    GFR: Estimated Creatinine Clearance: 40.7 mL/min (by C-G formula based on SCr of 1.04 mg/dL (H)). Liver Function Tests:  Recent Labs Lab 11/07/15 1737  AST 30  ALT 20  ALKPHOS 69  BILITOT 0.9   PROT 8.2*  ALBUMIN 4.7    Recent Labs Lab 11/07/15 1737  LIPASE 28   No results for input(s): AMMONIA in the last 168 hours. Coagulation Profile: No results for input(s): INR, PROTIME in the last 168 hours. Cardiac Enzymes: No results for input(s): CKTOTAL, CKMB, CKMBINDEX, TROPONINI in the last 168 hours. BNP (last 3 results) No results for input(s): PROBNP in the last 8760 hours. HbA1C: No results for input(s): HGBA1C in the last 72 hours. CBG:  Recent Labs Lab 11/11/15 0835 11/12/15 0738  GLUCAP 90 88   Lipid Profile: No results for input(s): CHOL, HDL, LDLCALC, TRIG, CHOLHDL, LDLDIRECT in the last 72 hours. Thyroid Function Tests:  Recent Labs  11/10/15 2053 11/11/15 0046  TSH 26.058*  --   FREET4  --  0.99   Anemia Panel:  Recent Labs  11/11/15 1108  VITAMINB12 629  FOLATE 15.5  FERRITIN 530*  TIBC 322  IRON 66  RETICCTPCT 1.1   Urine analysis:    Component Value Date/Time   COLORURINE YELLOW 11/10/2015 1912   APPEARANCEUR CLEAR 11/10/2015 1912   LABSPEC 1.004 (L) 11/10/2015 1912   PHURINE 7.0 11/10/2015 1912   GLUCOSEU NEGATIVE 11/10/2015 1912   HGBUR NEGATIVE 11/10/2015 1912   BILIRUBINUR NEGATIVE 11/10/2015 1912   KETONESUR NEGATIVE 11/10/2015 1912   PROTEINUR NEGATIVE 11/10/2015 1912   UROBILINOGEN 0.2 03/17/2010 1421   NITRITE NEGATIVE 11/10/2015 1912   LEUKOCYTESUR NEGATIVE 11/10/2015 1912   Sepsis Labs: @LABRCNTIP (procalcitonin:4,lacticidven:4) )No results found for this or any previous visit (from the past 240 hour(s)).       Radiology Studies: Ct Abdomen Pelvis W Contrast  Result Date: 11/11/2015 CLINICAL DATA:  Vomiting. EXAM: CT ABDOMEN AND PELVIS WITH CONTRAST TECHNIQUE: Multidetector CT imaging of the abdomen and pelvis was performed using the standard protocol following bolus administration of intravenous contrast. CONTRAST:  80mL ISOVUE-300 IOPAMIDOL (ISOVUE-300) INJECTION 61% COMPARISON:  None. FINDINGS: Lower chest:   No acute finding.  Partly seen breast implants. Hepatobiliary: Tiny portal venous shunt seen in the inferior right liver tip.No evidence of biliary obstruction or stone. Pancreas: Unremarkable. Spleen: Unremarkable. Adrenals/Urinary Tract: Negative adrenals. No hydronephrosis or stone. Unremarkable bladder. Stomach/Bowel:  No obstruction. No appendicitis. Vascular/Lymphatic: Atherosclerotic calcifications seen on the aorta. No acute vascular abnormality. No mass or adenopathy. Reproductive:No pathologic findings. Other: No ascites  or pneumoperitoneum. Musculoskeletal: No acute abnormalities. IMPRESSION: Negative.  No explanation for vomiting. Electronically Signed   By: Marnee SpringJonathon  Watts M.D.   On: 11/11/2015 16:41      Scheduled Meds: . ALPRAZolam  0.25 mg Oral TID  . enoxaparin (LOVENOX) injection  30 mg Subcutaneous Q24H  . famotidine (PEPCID) IV  20 mg Intravenous Q12H  . levothyroxine  75 mcg Intravenous QAC breakfast  . ondansetron (ZOFRAN) IV  4 mg Intravenous Q6H   Or  . ondansetron  4 mg Oral Q6H  . pantoprazole  40 mg Oral BID  . potassium chloride  10 mEq Oral Once  . protein supplement  8 oz Oral TID   Continuous Infusions:    LOS: 0 days    Time spent in minutes: 35    Coley Littles, MD [facility] Pager: [email or web address] Password [email or web address] 11/12/2015, 12:03 PM

## 2015-11-12 NOTE — Consult Note (Signed)
Referring Provider: Dr. Butler Denmarkizwan Primary Care Physician:  Julian HySOUTH,STEPHEN ALAN, MD Primary Gastroenterologist:  Dr. Bosie ClosSchooler  Reason for Consultation:  Nausea and vomiting  HPI: Lauren Bolton is a 55 y.o. female with several months of recurrent nausea and vomiting that will occur within a few minutes of eating. Poor appetite. Crampy abdominal pain. Denies dysphagia, melena, hematemesis. Recurrent heartburn despite taking daily Omeprazole. Reports less than 5 pound weight loss. Has been taking Advil BID for headaches for 2-3 months. EGD in 2011 showed chronic active H. Pylori gastritis and treated for H. Pylori but lost to f/u. Hgb 9.2. Husband at bedside and gives a lot of the history.  Past Medical History:  Diagnosis Date  . Iron deficiency anemia   . Thyroid disease     Past Surgical History:  Procedure Laterality Date  . thyroid goiter      Prior to Admission medications   Medication Sig Start Date End Date Taking? Authorizing Provider  ALPRAZolam (XANAX) 0.25 MG tablet Take 0.25 mg by mouth 3 (three) times daily. 10/23/15  Yes Historical Provider, MD  ibuprofen (ADVIL,MOTRIN) 400 MG tablet Take 1 tablet (400 mg total) by mouth every 6 (six) hours as needed. Patient taking differently: Take 400 mg by mouth every 6 (six) hours as needed for headache or mild pain.  06/21/15  Yes Jacalyn LefevreJulie Haviland, MD  levothyroxine (SYNTHROID, LEVOTHROID) 75 MCG tablet Take 75 mcg by mouth daily before breakfast.   Yes Historical Provider, MD  omeprazole (PRILOSEC) 40 MG capsule Take 40 mg by mouth daily. 10/29/15  Yes Historical Provider, MD  POLY-IRON 150 150 MG capsule Take 150 mg by mouth daily. 09/22/15  Yes Historical Provider, MD  ondansetron (ZOFRAN) 4 MG tablet Take 1 tablet (4 mg total) by mouth every 6 (six) hours. Patient not taking: Reported on 11/10/2015 11/07/15   Eyvonne MechanicJeffrey Hedges, PA-C    Scheduled Meds: . ALPRAZolam  0.25 mg Oral TID  . enoxaparin (LOVENOX) injection  30 mg Subcutaneous Q24H  .  famotidine (PEPCID) IV  20 mg Intravenous Q12H  . levothyroxine  75 mcg Intravenous QAC breakfast  . ondansetron (ZOFRAN) IV  4 mg Intravenous Q6H   Or  . ondansetron  4 mg Oral Q6H  . pantoprazole  40 mg Oral BID  . potassium chloride  10 mEq Oral Once  . protein supplement  8 oz Oral TID   Continuous Infusions: PRN Meds:.acetaminophen **OR** acetaminophen, hydrALAZINE, HYDROcodone-acetaminophen, iopamidol, simethicone  Allergies as of 11/10/2015  . (No Known Allergies)    History reviewed. No pertinent family history.  Social History   Social History  . Marital status: Married    Spouse name: N/A  . Number of children: N/A  . Years of education: N/A   Occupational History  . Not on file.   Social History Main Topics  . Smoking status: Never Smoker  . Smokeless tobacco: Never Used  . Alcohol use No  . Drug use: No  . Sexual activity: Not on file   Other Topics Concern  . Not on file   Social History Narrative  . No narrative on file    Review of Systems: All negative except as stated above in HPI.  Physical Exam: Vital signs: Vitals:   11/11/15 2232 11/12/15 0550  BP: 118/73 110/73  Pulse: 63 (!) 58  Resp: 14 16  Temp: 98.7 F (37.1 C) 98.1 F (36.7 C)   Last BM Date: 11/10/15 General:  Lethargic, thin, no acute distress HEENT: anicteric sclera Neck: supple,  nontender Lungs:  Clear throughout to auscultation.   No wheezes, crackles, or rhonchi. No acute distress. Heart:  Regular rate and rhythm; no murmurs, clicks, rubs,  or gallops. Abdomen: soft, nontender, nondistended, +BS  Rectal:  Deferred Ext: no edema  GI:  Lab Results:  Recent Labs  11/10/15 2052 11/12/15 0348  WBC 7.0 8.0  HGB 11.6* 9.2*  HCT 34.2* 27.3*  PLT 245 206   BMET  Recent Labs  11/10/15 2052 11/11/15 0047 11/12/15 0348  NA 138 139 137  K 3.3* 3.3* 4.6  CL 98* 105 106  CO2 29 28 25   GLUCOSE 105* 94 80  BUN <5* <5* 9  CREATININE 0.76 0.77 1.04*  CALCIUM 9.7  9.0 8.9   LFT No results for input(s): PROT, ALBUMIN, AST, ALT, ALKPHOS, BILITOT, BILIDIR, IBILI in the last 72 hours. PT/INR No results for input(s): LABPROT, INR in the last 72 hours.   Studies/Results: Ct Abdomen Pelvis W Contrast  Result Date: 11/11/2015 CLINICAL DATA:  Vomiting. EXAM: CT ABDOMEN AND PELVIS WITH CONTRAST TECHNIQUE: Multidetector CT imaging of the abdomen and pelvis was performed using the standard protocol following bolus administration of intravenous contrast. CONTRAST:  80mL ISOVUE-300 IOPAMIDOL (ISOVUE-300) INJECTION 61% COMPARISON:  None. FINDINGS: Lower chest:  No acute finding.  Partly seen breast implants. Hepatobiliary: Tiny portal venous shunt seen in the inferior right liver tip.No evidence of biliary obstruction or stone. Pancreas: Unremarkable. Spleen: Unremarkable. Adrenals/Urinary Tract: Negative adrenals. No hydronephrosis or stone. Unremarkable bladder. Stomach/Bowel:  No obstruction. No appendicitis. Vascular/Lymphatic: Atherosclerotic calcifications seen on the aorta. No acute vascular abnormality. No mass or adenopathy. Reproductive:No pathologic findings. Other: No ascites or pneumoperitoneum. Musculoskeletal: No acute abnormalities. IMPRESSION: Negative.  No explanation for vomiting. Electronically Signed   By: Marnee SpringJonathon  Watts M.D.   On: 11/11/2015 16:41    Impression/Plan: 55 yo with recurrent N/V and early satiety in the setting of chronic NSAID use. History concerning for peptic ulcer disease. No evidence of overt bleeding. Continue oral PPI Q 12 hours. EGD today if possible to further evaluate. Keep NPO for planned EGD.    LOS: 0 days   Wanda Rideout C.  11/12/2015, 8:54 AM  Pager [phone]  If no answer or after 5 PM call [phone]

## 2015-11-13 ENCOUNTER — Inpatient Hospital Stay (HOSPITAL_COMMUNITY): Payer: BLUE CROSS/BLUE SHIELD | Admitting: Anesthesiology

## 2015-11-13 ENCOUNTER — Encounter (HOSPITAL_COMMUNITY)
Admission: EM | Disposition: A | Discharge status: Home / Self Care | Payer: Self-pay | Source: Home / Self Care | Attending: Internal Medicine

## 2015-11-13 ENCOUNTER — Encounter (HOSPITAL_COMMUNITY): Payer: Self-pay | Admitting: *Deleted

## 2015-11-13 HISTORY — PX: ESOPHAGOGASTRODUODENOSCOPY: SHX5428

## 2015-11-13 LAB — GLUCOSE, CAPILLARY: GLUCOSE-CAPILLARY: 93 mg/dL (ref 65–99)

## 2015-11-13 SURGERY — EGD (ESOPHAGOGASTRODUODENOSCOPY)
Anesthesia: Monitor Anesthesia Care

## 2015-11-13 MED ORDER — PROPOFOL 10 MG/ML IV BOLUS
INTRAVENOUS | Status: DC | PRN
  Administered 2015-11-13 (×7): 20 mg via INTRAVENOUS

## 2015-11-13 MED ORDER — LEVOTHYROXINE SODIUM 100 MCG IV SOLR
100.0000 ug | Freq: Every day | INTRAVENOUS | Status: DC
  Administered 2015-11-14: 100 ug via INTRAVENOUS
  Filled 2015-11-13: qty 5

## 2015-11-13 MED ORDER — SODIUM CHLORIDE 0.9 % IV SOLN: INTRAVENOUS | Status: DC

## 2015-11-13 MED ORDER — LACTATED RINGERS IV SOLN
INTRAVENOUS | Status: DC
  Administered 2015-11-13: 1000 mL via INTRAVENOUS

## 2015-11-13 NOTE — Progress Notes (Signed)
PROGRESS NOTE    Lauren Bolton  ZOX:[id number] DOB: 02-19-1960 DOA: 11/10/2015  PCP: Julian Hy, MD   Brief Narrative:  Lauren Bolton is a 55 y.o. female with medical history significant for hypothyroidism, iron deficiency anemia, and anxiety who presents to the emergency department with 5 months of vomiting, burning in her chest, gas, burping, progressive chills, severe fatigue and malaise. She has been taking Omeprazole daily. She was given a referral to see a GI doctor as outpt but was too weak to go. She had and endoscopy about 5 yrs ago by Dr Bosie Clos which did no show any abnormalities. She is hypothryoid as well and is taking Synthroid but unable to tolerate increasing dose beyond 75 mcg due to worsening GI symptoms.   Subjective: Nauseated. Currently no heartburn or pain. Very weak.   Assessment & Plan:   Principal Problem:    Nausea with vomiting, heartburn, indigestion - likely PUD- LFTs Lipase normal-t - BID Protonix & Pepcid  - CT abdomen and pelvis with no acute findings, status post endoscopy today, normal esophagus and stomach, on full liquid diet, advance as tolerated  Active Problems:   Hypothyroidism TSH       right;">26.058<=0.01  >        T4,Free(Direct)        0.99     Discussed with [name], will increase her Synthroid 200 g oral daily     Hypokalemia - replacing- Mg normal    Iron deficiency anemia - hold oral Iron for now- check anemia panel    Malaise and fatigue/   Protein-calorie malnutrition, severe - nutritional supplements added     Anxiety state - PRN Xanax  HTN - PRN Hydralazine for now- follow BP    DVT prophylaxis: Lovenox Code Status: Full code Family Communication: husbandAt bedside Disposition Plan: home when stable Consultants:   GI   Procedures:    Antimicrobials:  Anti-infectives    None       Objective: Vitals:   11/13/15 1235 11/13/15 1240 11/13/15 1255 11/13/15 1458  BP: (!) 144/74 (!) 148/70 138/82  134/78  Pulse: 74   73  Resp: (!) 25   18  Temp:   98.2 F (36.8 C) 98.8 F (37.1 C)  TempSrc:   Oral Oral  SpO2: 100%   100%  Weight:      Height:        Intake/Output Summary (Last 24 hours) at 11/13/15 1528 Last data filed at 11/13/15 1459  Gross per 24 hour  Intake              600 ml  Output                0 ml  Net              600 ml   Filed Weights   11/11/15 0015 11/13/15 0631  Weight: 41.7 kg (92 lb) 42.4 kg (93 lb 8 oz)    Examination: General exam: thin female laying in bed, Appears uncomfortable  HEENT: PERRLA, oral mucosa moist, no sclera icterus or thrush Respiratory system: Clear to auscultation. Respiratory effort normal. Cardiovascular system: S1 & S2 heard, RRR.  No murmurs  Gastrointestinal system: Abdomen soft, non-tender, nondistended. Normal bowel sound. No organomegaly Central nervous system: Alert and oriented. No focal neurological deficits. Extremities: No cyanosis, clubbing or edema Skin: No rashes or ulcers Psychiatry:  Mood & affect appropriate.     Data Reviewed: I have personally reviewed following labs and  imaging studies  CBC:  Recent Labs Lab 11/07/15 1737 11/10/15 2052 11/12/15 0348  WBC 7.0 7.0 8.0  NEUTROABS 4.8 4.4  --   HGB 11.2* 11.6* 9.2*  HCT 33.9* 34.2* 27.3*  MCV 74.2* 72.6* 75.4*  PLT 232 245 206   Basic Metabolic Panel:  Recent Labs Lab 11/07/15 1737 11/10/15 2052 11/10/15 2104 11/11/15 0047 11/12/15 0348  NA 133* 138  --  139 137  K 3.2* 3.3*  --  3.3* 4.6  CL 96* 98*  --  105 106  CO2 27 29  --  28 25  GLUCOSE 102* 105*  --  94 80  BUN <5* <5*  --  <5* 9  CREATININE 0.79 0.76  --  0.77 1.04*  CALCIUM 9.3 9.7  --  9.0 8.9  MG  --   --  2.3  --   --    GFR: Estimated Creatinine Clearance: 41.4 mL/min (by C-G formula based on SCr of 1.04 mg/dL (H)). Liver Function Tests:  Recent Labs Lab 11/07/15 1737  AST 30  ALT 20  ALKPHOS 69  BILITOT 0.9  PROT 8.2*  ALBUMIN 4.7    Recent Labs Lab  11/07/15 1737  LIPASE 28   No results for input(s): AMMONIA in the last 168 hours. Coagulation Profile: No results for input(s): INR, PROTIME in the last 168 hours. Cardiac Enzymes: No results for input(s): CKTOTAL, CKMB, CKMBINDEX, TROPONINI in the last 168 hours. BNP (last 3 results) No results for input(s): PROBNP in the last 8760 hours. HbA1C: No results for input(s): HGBA1C in the last 72 hours. CBG:  Recent Labs Lab 11/11/15 0835 11/12/15 0738 11/13/15 0758  GLUCAP 90 88 93   Lipid Profile: No results for input(s): CHOL, HDL, LDLCALC, TRIG, CHOLHDL, LDLDIRECT in the last 72 hours. Thyroid Function Tests:  Recent Labs  11/10/15 2053 11/11/15 0046  TSH 26.058*  --   FREET4  --  0.99   Anemia Panel:  Recent Labs  11/11/15 1108  VITAMINB12 629  FOLATE 15.5  FERRITIN 530*  TIBC 322  IRON 66  RETICCTPCT 1.1   Urine analysis:    Component Value Date/Time   COLORURINE YELLOW 11/10/2015 1912   APPEARANCEUR CLEAR 11/10/2015 1912   LABSPEC 1.004 (L) 11/10/2015 1912   PHURINE 7.0 11/10/2015 1912   GLUCOSEU NEGATIVE 11/10/2015 1912   HGBUR NEGATIVE 11/10/2015 1912   BILIRUBINUR NEGATIVE 11/10/2015 1912   KETONESUR NEGATIVE 11/10/2015 1912   PROTEINUR NEGATIVE 11/10/2015 1912   UROBILINOGEN 0.2 03/17/2010 1421   NITRITE NEGATIVE 11/10/2015 1912   LEUKOCYTESUR NEGATIVE 11/10/2015 1912   Sepsis Labs: @LABRCNTIP (procalcitonin:4,lacticidven:4) )No results found for this or any previous visit (from the past 240 hour(s)).       Radiology Studies: Ct Abdomen Pelvis W Contrast  Result Date: 11/11/2015 CLINICAL DATA:  Vomiting. EXAM: CT ABDOMEN AND PELVIS WITH CONTRAST TECHNIQUE: Multidetector CT imaging of the abdomen and pelvis was performed using the standard protocol following bolus administration of intravenous contrast. CONTRAST:  80mL ISOVUE-300 IOPAMIDOL (ISOVUE-300) INJECTION 61% COMPARISON:  None. FINDINGS: Lower chest:  No acute finding.  Partly seen  breast implants. Hepatobiliary: Tiny portal venous shunt seen in the inferior right liver tip.No evidence of biliary obstruction or stone. Pancreas: Unremarkable. Spleen: Unremarkable. Adrenals/Urinary Tract: Negative adrenals. No hydronephrosis or stone. Unremarkable bladder. Stomach/Bowel:  No obstruction. No appendicitis. Vascular/Lymphatic: Atherosclerotic calcifications seen on the aorta. No acute vascular abnormality. No mass or adenopathy. Reproductive:No pathologic findings. Other: No ascites or pneumoperitoneum. Musculoskeletal: No acute abnormalities.  IMPRESSION: Negative.  No explanation for vomiting. Electronically Signed   By: Marnee SpringJonathon  Watts M.D.   On: 11/11/2015 16:41      Scheduled Meds: . ALPRAZolam  0.25 mg Oral TID  . famotidine (PEPCID) IV  20 mg Intravenous Q12H  . [START ON 11/14/2015] levothyroxine  100 mcg Intravenous QAC breakfast  . ondansetron (ZOFRAN) IV  4 mg Intravenous Q6H   Or  . ondansetron  4 mg Oral Q6H  . pantoprazole  40 mg Oral BID  . potassium chloride  10 mEq Oral Once  . protein supplement  8 oz Oral TID   Continuous Infusions: . sodium chloride 100 mL/hr at 11/12/15 1706  . lactated ringers 1,000 mL (11/13/15 1058)     LOS: 1 day      Felicia Both, MD [facility] Pager: [email or web address] Password [email or web address] 11/13/2015, 3:28 PM

## 2015-11-13 NOTE — Progress Notes (Signed)
PT Cancellation Note  Patient Details Name: Lauren LionsSopheap Milliner MRN: [id number] DOB: 1960/10/12   Cancelled Treatment:    Reason Eval/Treat Not Completed: Patient at procedure or test/unavailable   Rada HayHill, Lucas Winograd Elizabeth 11/13/2015, 10:46 AM Blanchard KelchKaren Malissa Slay PT [phone]

## 2015-11-13 NOTE — Anesthesia Postprocedure Evaluation (Signed)
Anesthesia Post Note  Patient: Lauren Bolton  Procedure(s) Performed: Procedure(s) (LRB): ESOPHAGOGASTRODUODENOSCOPY (EGD) (N/A)  Patient location during evaluation: Endoscopy Anesthesia Type: MAC Level of consciousness: awake and alert, oriented and patient cooperative Pain management: pain level controlled Vital Signs Assessment: post-procedure vital signs reviewed and stable Respiratory status: spontaneous breathing, nonlabored ventilation and respiratory function stable Cardiovascular status: blood pressure returned to baseline and stable Postop Assessment: no signs of nausea or vomiting Anesthetic complications: no    Last Vitals:  Vitals:   11/13/15 1210 11/13/15 1215  BP: (!) 78/46 (!) 91/42  Pulse: 66 66  Resp: 19 (!) 21  Temp:      Last Pain:  Vitals:   11/13/15 1206  TempSrc: Oral  PainSc:                  Brentin Shin,E. Orvel Cutsforth

## 2015-11-13 NOTE — Anesthesia Preprocedure Evaluation (Addendum)
Anesthesia Evaluation  Patient identified by MRN, date of birth, ID band Patient awake    Reviewed: Allergy & Precautions, NPO status , Patient's Chart, lab work & pertinent test results  History of Anesthesia Complications Negative for: history of anesthetic complications  Airway Mallampati: II  TM Distance: >3 FB Neck ROM: Full    Dental  (+) Dental Advisory Given   Pulmonary neg pulmonary ROS,    breath sounds clear to auscultation       Cardiovascular negative cardio ROS   Rhythm:Regular Rate:Normal     Neuro/Psych Anxiety negative neurological ROS     GI/Hepatic Neg liver ROS, GERD  Medicated and Poorly Controlled,  Endo/Other  Hypothyroidism   Renal/GU negative Renal ROS     Musculoskeletal   Abdominal   Peds  Hematology  (+) Blood dyscrasia (Hb 9.2), anemia ,   Anesthesia Other Findings   Reproductive/Obstetrics                            Anesthesia Physical Anesthesia Plan  ASA: II  Anesthesia Plan: MAC   Post-op Pain Management:    Induction:   Airway Management Planned: Natural Airway  Additional Equipment:   Intra-op Plan:   Post-operative Plan:   Informed Consent: I have reviewed the patients History and Physical, chart, labs and discussed the procedure including the risks, benefits and alternatives for the proposed anesthesia with the patient or authorized representative who has indicated his/her understanding and acceptance.   Dental advisory given  Plan Discussed with: CRNA and Surgeon  Anesthesia Plan Comments: (Plan routine monitors, MAC)        Anesthesia Quick Evaluation

## 2015-11-13 NOTE — Op Note (Signed)
Lauren Bolton Patient Name: Lauren Bolton Procedure Date: 11/13/2015 MRN: [id number] Attending MD: Barrie Folk , MD Date of Birth: [birth year] CSN: [id number] Age: 55 Admit Type: Inpatient Procedure:                Upper GI endoscopy Indications:              Epigastric abdominal pain Providers:                Everardo All. Madilyn Fireman, MD, Anthony Sar, RN, Oletha Blend, Technician Referring MD:              Medicines:                Propofol per Anesthesia Complications:            No immediate complications. Estimated Blood Loss:     Estimated blood loss: none. Procedure:                Pre-Anesthesia Assessment:                           - Prior to the procedure, a History and Physical                            was performed, and patient medications and                            allergies were reviewed. The patient's tolerance of                            previous anesthesia was also reviewed. The risks                            and benefits of the procedure and the sedation                            options and risks were discussed with the patient.                            All questions were answered, and informed consent                            was obtained. Prior Anticoagulants: The patient has                            taken no previous anticoagulant or antiplatelet                            agents. ASA Grade Assessment: II - A patient with                            mild systemic disease. After reviewing the risks  and benefits, the patient was deemed in                            satisfactory condition to undergo the procedure.                           After obtaining informed consent, the endoscope was                            passed under direct vision. Throughout the                            procedure, the patient's blood pressure, pulse, and                            oxygen saturations were  monitored continuously. The                            [id number]) scope was introduced through the                            mouth, and advanced to the second part of duodenum.                            The upper GI endoscopy was accomplished without                            difficulty. The patient tolerated the procedure                            well. Findings:      The examined esophagus was normal.      The entire examined stomach was normal. Biopsies were taken with a cold       forceps for Helicobacter pylori testing using CLOtest.      The examined duodenum was normal. Impression:               - Normal esophagus.                           - Normal stomach. Biopsied.                           - Normal examined duodenum. Moderate Sedation:      no moderate sedation Recommendation:           - Await pathology results.                           - Advance diet as tolerated.                           - Continue present medications. Procedure Code(s):        --- Professional ---                           [id number], Esophagogastroduodenoscopy, flexible,  transoral; with biopsy, single or multiple Diagnosis Code(s):        --- Professional ---                           R10.13, Epigastric pain CPT copyright 2016 [employer]. All rights reserved. The codes documented in this report are preliminary and upon coder review may  be revised to meet current compliance requirements. Barrie FolkJohn C Tersa Fotopoulos, MD 11/13/2015 12:06:37 PM This report has been signed electronically. Number of Addenda: 0

## 2015-11-13 NOTE — Transfer of Care (Signed)
Immediate Anesthesia Transfer of Care Note  Patient: Lauren Bolton  Procedure(s) Performed: Procedure(s): ESOPHAGOGASTRODUODENOSCOPY (EGD) (N/A)  Patient Location: PACU  Anesthesia Type:MAC  Level of Consciousness: sedated  Airway & Oxygen Therapy: Patient Spontanous Breathing and Patient connected to nasal cannula oxygen  Post-op Assessment: Report given to RN and Post -op Vital signs reviewed and stable  Post vital signs: Reviewed and stable  Last Vitals:  Vitals:   11/13/15 0631 11/13/15 1046  BP: 135/78 (!) 157/84  Pulse: 76   Resp: 20 10  Temp: 36.8 C 36.7 C    Last Pain:  Vitals:   11/13/15 1046  TempSrc: Oral  PainSc:          Complications: No apparent anesthesia complications

## 2015-11-14 ENCOUNTER — Encounter (HOSPITAL_COMMUNITY): Payer: Self-pay | Admitting: Gastroenterology

## 2015-11-14 LAB — GLUCOSE, CAPILLARY
Glucose-Capillary: 96 mg/dL (ref 65–99)
Glucose-Capillary: 165 mg/dL — ABNORMAL HIGH (ref 65–99)

## 2015-11-14 LAB — CLOTEST (H. PYLORI), BIOPSY: HELICOBACTER SCREEN: NEGATIVE

## 2015-11-14 MED ORDER — LEVOTHYROXINE SODIUM 100 MCG PO TABS: 100.0000 ug | ORAL_TABLET | Freq: Every day | ORAL | Status: DC

## 2015-11-14 NOTE — Progress Notes (Signed)
Nutrition Follow-up  DOCUMENTATION CODES:   Severe malnutrition in context of acute illness/injury, Underweight  INTERVENTION:   -D/c Unjury Chicken soup supplements per pt request -Encourage PO intake -Reviewed protein supplement options with pt and pt's husband -RD to continue to monitor  NUTRITION DIAGNOSIS:   Inadequate oral intake related to nausea, vomiting as evidenced by per patient/family report.  Ongoing.  GOAL:   Patient will meet greater than or equal to 90% of their needs  Not meeting.  MONITOR:   PO intake, Supplement acceptance, Labs, Weight trends, I & O's  ASSESSMENT:   [age] y.o. female with medical history significant for hypothyroidism, iron deficiency anemia, and anxiety who presents to the emergency department with [place]-standing and progressive chills, fatigue, malaise, and intolerance of oral intake. Patient had a goiter removed in 2012 and has since been under treatment for hypothyroidism. She was reportedly doing okay until June of this year when she developed malaise and fatigue with skin changes and nausea.   Patient in room with husband at bedside. Pt reports eating some broth and a few bites of grits. Pt just took an iron pill during RD visit. RD encouraged pt to eat some crackers to get more food on her stomach to prevent nausea from the iron supplements.  Pt states she does not eat meats, she mainly consumes fish at home. Encouraged her to consume fish at home and to incorporate some plant based protein foods in diet. Reviewed plant based protein powder options that she may be able to add to food. Pt is not meeting protein needs. Pt states she is going home today.  Medications: Zofran IV every 6 hours Labs reviewed: CBGs: 96-165  Diet Order:  DIET SOFT Room service appropriate? Yes; Fluid consistency: Thin  Skin:  Reviewed, no issues  Last BM:  11/8  Height:   Ht Readings from Last 1 Encounters:  11/11/15 5' (1.524 m)    Weight:   Wt  Readings from Last 1 Encounters:  11/14/15 91 lb 14.4 oz (41.7 kg)    Ideal Body Weight:  45.5 kg  BMI:  Body mass index is 17.95 kg/m.  Estimated Nutritional Needs:   Kcal:  1250-1450  Protein:  60-70g  Fluid:  1.5L/day  EDUCATION NEEDS:   Education needs addressed  Tilda FrancoLindsey Keiran Gaffey, MS, RD, LDN Pager: [phone] After Hours Pager: [phone]

## 2015-11-14 NOTE — Discharge Summary (Signed)
Lauren Bolton, is a 55 y.o. female  DOB Feb 21, 1960  MRN [id number].  Admission date:  11/10/2015  Admitting Physician  Vianne Bulls, MD  Discharge Date:  11/14/2015   Primary MD  Sheela Stack, MD  Recommendations for primary care physician for things to follow:  - Patient instructed to keep her appointment with Dr. Forde Dandy on 11/19/2015 - Please follow the final results on stomach biopsy, still pending at time of discharge   Admission Diagnosis  Decreased oral intake [R63.8] Hypothyroidism, unspecified type [E03.9]   Discharge Diagnosis  Decreased oral intake [R63.8] Hypothyroidism, unspecified type [E03.9]    Principal Problem:   Nausea with vomiting Active Problems:   Hypothyroidism   Hypokalemia   Iron deficiency anemia   Malaise and fatigue   Failure to thrive in adult   Anxiety state   Protein-calorie malnutrition, severe   Decreased oral intake      Past Medical History:  Diagnosis Date  . Iron deficiency anemia   . Thyroid disease     Past Surgical History:  Procedure Laterality Date  . ESOPHAGOGASTRODUODENOSCOPY N/A 11/13/2015   Procedure: ESOPHAGOGASTRODUODENOSCOPY (EGD);  Surgeon: Teena Irani, MD;  Location: [facility];  Service: Endoscopy;  Laterality: N/A;  . thyroid goiter         History of present illness and  Hospital Course:     Kindly see H&P for history of present illness and admission details, please review complete Labs, Consult reports and Test reports for all details in brief  HPI  from the history and physical done on the day of admission 11/11/2015  HPI: Lauren Bolton is a 55 y.o. female with medical history significant for hypothyroidism, iron deficiency anemia, and anxiety who presents to the emergency department with Lauren Bolton and progressive chills, fatigue, malaise, and intolerance of oral intake. Patient had a goiter removed in 2012 and has  since been under treatment for hypothyroidism. She was reportedly doing okay until June of this year when she developed malaise and fatigue with skin changes and nausea. TSH was noted to be elevated at that time and she had her Synthroid increased. Unfortunately, she reports being unable to tolerate the increased dose due to weakness and vomiting and so the dose was reduced. Since that time, she has attempted to increase her Synthroid multiple times, but each attempt is met with worsening in her weakness and nausea. She has been eating a small bowl of watery rice each day for the past couple months and this is about all she can tolerate per the report of the patient and her husband. She is unable to keep the pills down consistently. She denies any recent fevers, but notes chills. There has been no dyspnea or cough and she is not vomiting and does not have diarrhea. There has been no confusion or focal numbness or weakness.  ED Course: Upon arrival to the ED, patient is found to be afebrile, saturating adequately on room air, and with vital signs stable. Chemistry panels notable for potassium of 3.3 and  CBC features a stable microcytic anemia with hemoglobin of 11.6 and MCV of 72.6. TSH returned markedly elevated to a value of 26.058. Urinalysis is notable only for a decreased specific gravity. Patient was given a liter of normal saline in the emergency department and remained hemodynamically stable and in no respiratory distress. Given her intolerance for oral intake, patient will be observed on medical surgical unit for ongoing evaluation and management of her hypothyroidism with worsening chills, malaise, and fatigue.  Hospital Course   Nausea with vomiting, heartburn, indigestion - Unclear etiology, CT abdomen and pelvis with no acute findings, status post endoscopy 11/8, with normal esophagus, stomach. - These symptoms might be stress related, as well possibly related to anxiety  - Encouraged to  continue PPI on discharge,     Hypothyroidism TSH       right;">26.058<=0.01  >        T4,Free(Direct)        0.99     Discussed with Dr. Forde Dandy, will increase her Synthroid to 100 g oral daily     Hypokalemia - Repleted    Iron deficiency anemia - Continue with iron supplement on discharge    Malaise and fatigue - Patient with numerous nonspecific complaints including dry mouth, occasional palpitation, generalized weakness and fatigue, this may be related to hypothyroidism, versus early unopposed, as well possibly stress and anxiety contributing to it     Anxiety state - PRN Xanax  HTN - Acceptable off medication       Discharge Condition:  Stable   Follow UP  Follow-up Information    Sheela Stack, MD Follow up in 1 week(s).   Specialty:  Endocrinology Contact information: [place]  [phone]             Discharge Instructions  and  Discharge Medications     Discharge Instructions    Discharge instructions    Complete by:  As directed    Follow with Primary MD Sheela Stack, MD in 7 days   Get CBC, CMP,checked  by Primary MD next visit.    Activity: As tolerated with Full fall precautions use walker/cane & assistance as needed   Disposition Home    Diet: Regular diet  , with feeding assistance and aspiration precautions.  For Heart failure patients - Check your Weight same time everyday, if you gain over 2 pounds, or you develop in leg swelling, experience more shortness of breath or chest pain, call your Primary MD immediately. Follow Cardiac Low Salt Diet and 1.5 lit/day fluid restriction.   On your next visit with your primary care physician please Get Medicines reviewed and adjusted.   Please request your Prim.MD to go over all Hospital Tests and Procedure/Radiological results at the follow up, please get all Hospital records sent to your Prim MD by signing hospital  release before you go home.   If you experience worsening of your admission symptoms, develop shortness of breath, life threatening emergency, suicidal or homicidal thoughts you must seek medical attention immediately by calling 911 or calling your MD immediately  if symptoms less severe.  You Must read complete instructions/literature along with all the possible adverse reactions/side effects for all the Medicines you take and that have been prescribed to you. Take any new Medicines after you have completely understood and accpet all the possible adverse reactions/side effects.   Do not drive, operating heavy machinery, perform activities at heights, swimming or participation in water activities or provide baby sitting  services if your were admitted for syncope or siezures until you have seen by Primary MD or a Neurologist and advised to do so again.  Do not drive when taking Pain medications.    Do not take more than prescribed Pain, Sleep and Anxiety Medications  Special Instructions: If you have smoked or chewed Tobacco  in the last 2 yrs please stop smoking, stop any regular Alcohol  and or any Recreational drug use.  Wear Seat belts while driving.   Please note  You were cared for by a hospitalist during your hospital stay. If you have any questions about your discharge medications or the care you received while you were in the hospital after you are discharged, you can call the unit and asked to speak with the hospitalist on call if the hospitalist that took care of you is not available. Once you are discharged, your primary care physician will handle any further medical issues. Please note that NO REFILLS for any discharge medications will be authorized once you are discharged, as it is imperative that you return to your primary care physician (or establish a relationship with a primary care physician if you do not have one) for your aftercare needs so that they can reassess your need  for medications and monitor your lab values.   Increase activity slowly    Complete by:  As directed        Medication List    STOP taking these medications   ibuprofen 400 MG tablet Commonly known as:  ADVIL,MOTRIN     TAKE these medications   ALPRAZolam 0.25 MG tablet Commonly known as:  XANAX Take 0.25 mg by mouth 3 (three) times daily.   levothyroxine 100 MCG tablet Commonly known as:  SYNTHROID Take 1 tablet (100 mcg total) by mouth daily before breakfast. What changed:  medication strength  how much to take   omeprazole 40 MG capsule Commonly known as:  PRILOSEC Take 40 mg by mouth daily.   ondansetron 4 MG tablet Commonly known as:  ZOFRAN Take 1 tablet (4 mg total) by mouth every 6 (six) hours.   POLY-IRON 150 150 MG capsule Generic drug:  iron polysaccharides Take 150 mg by mouth daily.         Diet and Activity recommendation: See Discharge Instructions above   Consults obtained - GI   Major procedures and Radiology Reports - PLEASE review detailed and final reports for all details, in brief -   EGD 11/13/2015   Ct Abdomen Pelvis W Contrast  Result Date: 11/11/2015 CLINICAL DATA:  Vomiting. EXAM: CT ABDOMEN AND PELVIS WITH CONTRAST TECHNIQUE: Multidetector CT imaging of the abdomen and pelvis was performed using the standard protocol following bolus administration of intravenous contrast. CONTRAST:  75m ISOVUE-300 IOPAMIDOL (ISOVUE-300) INJECTION 61% COMPARISON:  None. FINDINGS: Lower chest:  No acute finding.  Partly seen breast implants. Hepatobiliary: Tiny portal venous shunt seen in the inferior right liver tip.No evidence of biliary obstruction or stone. Pancreas: Unremarkable. Spleen: Unremarkable. Adrenals/Urinary Tract: Negative adrenals. No hydronephrosis or stone. Unremarkable bladder. Stomach/Bowel:  No obstruction. No appendicitis. Vascular/Lymphatic: Atherosclerotic calcifications seen on the aorta. No acute vascular abnormality. No  mass or adenopathy. Reproductive:No pathologic findings. Other: No ascites or pneumoperitoneum. Musculoskeletal: No acute abnormalities. IMPRESSION: Negative.  No explanation for vomiting. Electronically Signed   By: JMonte FantasiaM.D.   On: 11/11/2015 16:41    Micro Results     No results found for this or any previous visit (from the  past 240 hour(s)).     Today   Subjective:   Lauren Bolton today has no headache,no chest or abdominal pain, reports generalized weakness and fatigue, poor appetite, but she is tolerating soft diet .   Objective:   Blood pressure 125/78, pulse 65, temperature 97.8 F (36.6 C), temperature source Oral, resp. rate 20, height 5' (1.524 m), weight 41.7 kg (91 lb 14.4 oz), SpO2 99 %.   Intake/Output Summary (Last 24 hours) at 11/14/15 1239 Last data filed at 11/14/15 1100  Gross per 24 hour  Intake             2369 ml  Output              850 ml  Net             1519 ml    Exam General exam: thin female laying in bed, NAD HEENT: PERRLA, oral mucosa moist, no sclera icterus or thrush Respiratory system: Clear to auscultation. Respiratory effort normal. Cardiovascular system: S1 & S2 heard, RRR.  No murmurs  Gastrointestinal system: Abdomen soft, non-tender, nondistended. Normal bowel sound. No organomegaly Central nervous system: Alert and oriented. No focal neurological deficits. Extremities: No cyanosis, clubbing or edema Skin: No rashes or ulcers Psychiatry:  Mood & affect appropriate.    Data Review   CBC w Diff: Lab Results  Component Value Date   WBC 8.0 11/12/2015   HGB 9.2 (L) 11/12/2015   HCT 27.3 (L) 11/12/2015   PLT 206 11/12/2015   LYMPHOPCT 30 11/10/2015   MONOPCT 7 11/10/2015   EOSPCT 0 11/10/2015   BASOPCT 0 11/10/2015    CMP: Lab Results  Component Value Date   NA 137 11/12/2015   K 4.6 11/12/2015   CL 106 11/12/2015   CO2 25 11/12/2015   BUN 9 11/12/2015   CREATININE 1.04 (H) 11/12/2015   PROT 8.2 (H)  11/07/2015   ALBUMIN 4.7 11/07/2015   BILITOT 0.9 11/07/2015   ALKPHOS 69 11/07/2015   AST 30 11/07/2015   ALT 20 11/07/2015  .   Total Time in preparing paper work, data evaluation and todays exam - 35 minutes  ELGERGAWY, DAWOOD M.D on 11/14/2015 at 12:39 PM  [facility]   Office  [phone]

## 2015-11-14 NOTE — Progress Notes (Signed)
PT Cancellation Note  Patient Details Name: Lauren Bolton MRN: [id number] DOB: [birth year]   Cancelled Treatment:      Cx treatment d/t patient D/C today.    Lauren Bolton, [place]PTA [facility] [phone]  Felecia ShellingLori Delta Bolton  PTA [facility]  Ac[facility]  Rehab Pager      [phone]

## 2015-11-14 NOTE — Discharge Instructions (Signed)
Follow with Primary MD Julian HySOUTH,STEPHEN ALAN, MD in 7 days   Get CBC, CMP,checked  by Primary MD next visit.    Activity: As tolerated with Full fall precautions use walker/cane & assistance as needed   Disposition Home    Diet: Heart Healthy  , with feeding assistance and aspiration precautions.  For Heart failure patients - Check your Weight same time everyday, if you gain over 2 pounds, or you develop in leg swelling, experience more shortness of breath or chest pain, call your Primary MD immediately. Follow Cardiac Low Salt Diet and 1.5 lit/day fluid restriction.   On your next visit with your primary care physician please Get Medicines reviewed and adjusted.   Please request your Prim.MD to go over all Hospital Tests and Procedure/Radiological results at the follow up, please get all Hospital records sent to your Prim MD by signing hospital release before you go home.   If you experience worsening of your admission symptoms, develop shortness of breath, life threatening emergency, suicidal or homicidal thoughts you must seek medical attention immediately by calling 911 or calling your MD immediately  if symptoms less severe.  You Must read complete instructions/literature along with all the possible adverse reactions/side effects for all the Medicines you take and that have been prescribed to you. Take any new Medicines after you have completely understood and accpet all the possible adverse reactions/side effects.   Do not drive, operating heavy machinery, perform activities at heights, swimming or participation in water activities or provide baby sitting services if your were admitted for syncope or siezures until you have seen by Primary MD or a Neurologist and advised to do so again.  Do not drive when taking Pain medications.    Do not take more than prescribed Pain, Sleep and Anxiety Medications  Special Instructions: If you have smoked or chewed Tobacco  in the last 2 yrs  please stop smoking, stop any regular Alcohol  and or any Recreational drug use.  Wear Seat belts while driving.   Please note  You were cared for by a hospitalist during your hospital stay. If you have any questions about your discharge medications or the care you received while you were in the hospital after you are discharged, you can call the unit and asked to speak with the hospitalist on call if the hospitalist that took care of you is not available. Once you are discharged, your primary care physician will handle any further medical issues. Please note that NO REFILLS for any discharge medications will be authorized once you are discharged, as it is imperative that you return to your primary care physician (or establish a relationship with a primary care physician if you do not have one) for your aftercare needs so that they can reassess your need for medications and monitor your lab values.

## 2015-11-19 DIAGNOSIS — R1084 Generalized abdominal pain: Secondary | ICD-10-CM | POA: Diagnosis not present

## 2015-11-19 DIAGNOSIS — E890 Postprocedural hypothyroidism: Secondary | ICD-10-CM | POA: Diagnosis not present

## 2015-11-19 DIAGNOSIS — R7301 Impaired fasting glucose: Secondary | ICD-10-CM | POA: Diagnosis not present

## 2015-11-19 DIAGNOSIS — D6489 Other specified anemias: Secondary | ICD-10-CM | POA: Diagnosis not present

## 2015-11-23 ENCOUNTER — Encounter (HOSPITAL_COMMUNITY): Payer: Self-pay | Admitting: *Deleted

## 2015-11-23 ENCOUNTER — Emergency Department (HOSPITAL_COMMUNITY)
Admission: EM | Admit: 2015-11-23 | Discharge: 2015-11-24 | Disposition: A | Discharge status: Home / Self Care | Payer: BLUE CROSS/BLUE SHIELD | Attending: Emergency Medicine | Admitting: Emergency Medicine

## 2015-11-23 DIAGNOSIS — R45851 Suicidal ideations: Secondary | ICD-10-CM | POA: Diagnosis not present

## 2015-11-23 DIAGNOSIS — R109 Unspecified abdominal pain: Secondary | ICD-10-CM

## 2015-11-23 DIAGNOSIS — F339 Major depressive disorder, recurrent, unspecified: Secondary | ICD-10-CM | POA: Diagnosis not present

## 2015-11-23 DIAGNOSIS — E039 Hypothyroidism, unspecified: Secondary | ICD-10-CM | POA: Diagnosis not present

## 2015-11-23 LAB — URINALYSIS, ROUTINE W REFLEX MICROSCOPIC
Bilirubin Urine: NEGATIVE
Glucose, UA: NEGATIVE mg/dL
Hgb urine dipstick: NEGATIVE
Ketones, ur: NEGATIVE mg/dL
NITRITE: NEGATIVE
PROTEIN: NEGATIVE mg/dL
Specific Gravity, Urine: 1.002 — ABNORMAL LOW (ref 1.005–1.030)
pH: 7 (ref 5.0–8.0)

## 2015-11-23 LAB — CBC
HCT: 30 % — ABNORMAL LOW (ref 36.0–46.0)
Hemoglobin: 10.1 g/dL — ABNORMAL LOW (ref 12.0–15.0)
MCH: 24.9 pg — ABNORMAL LOW (ref 26.0–34.0)
MCHC: 33.7 g/dL (ref 30.0–36.0)
MCV: 73.9 fL — AB (ref 78.0–100.0)
PLATELETS: 231 10*3/uL (ref 150–400)
RBC: 4.06 MIL/uL (ref 3.87–5.11)
RDW: 13.8 % (ref 11.5–15.5)
WBC: 6.2 10*3/uL (ref 4.0–10.5)

## 2015-11-23 LAB — COMPREHENSIVE METABOLIC PANEL
ALT: 19 U/L (ref 14–54)
AST: 27 U/L (ref 15–41)
Albumin: 4.1 g/dL (ref 3.5–5.0)
Alkaline Phosphatase: 57 U/L (ref 38–126)
Anion gap: 10 (ref 5–15)
BILIRUBIN TOTAL: 0.6 mg/dL (ref 0.3–1.2)
CALCIUM: 9.4 mg/dL (ref 8.9–10.3)
CHLORIDE: 99 mmol/L — AB (ref 101–111)
CO2: 25 mmol/L (ref 22–32)
CREATININE: 0.67 mg/dL (ref 0.44–1.00)
Glucose, Bld: 98 mg/dL (ref 65–99)
Potassium: 2.8 mmol/L — ABNORMAL LOW (ref 3.5–5.1)
Sodium: 134 mmol/L — ABNORMAL LOW (ref 135–145)
TOTAL PROTEIN: 7.3 g/dL (ref 6.5–8.1)

## 2015-11-23 LAB — ACETAMINOPHEN LEVEL

## 2015-11-23 LAB — URINE MICROSCOPIC-ADD ON

## 2015-11-23 LAB — SALICYLATE LEVEL

## 2015-11-23 LAB — LIPASE, BLOOD: LIPASE: 24 U/L (ref 11–51)

## 2015-11-23 MED ORDER — KCL IN DEXTROSE-NACL 40-5-0.45 MEQ/L-%-% IV SOLN
INTRAVENOUS | Status: DC
  Administered 2015-11-23: 23:00:00 via INTRAVENOUS
  Filled 2015-11-23: qty 1000

## 2015-11-23 MED ORDER — POTASSIUM CHLORIDE 20 MEQ/15ML (10%) PO SOLN
40.0000 meq | Freq: Once | ORAL | Status: AC
  Administered 2015-11-23: 40 meq via ORAL
  Filled 2015-11-23: qty 30

## 2015-11-23 MED ORDER — POTASSIUM CHLORIDE 20 MEQ/15ML (10%) PO SOLN: 40.0000 meq | Freq: Every day | ORAL | Status: DC

## 2015-11-23 MED ORDER — GI COCKTAIL ~~LOC~~
30.0000 mL | Freq: Once | ORAL | Status: AC
  Administered 2015-11-23: 30 mL via ORAL
  Filled 2015-11-23: qty 30

## 2015-11-23 NOTE — ED Triage Notes (Signed)
The pt has had abd pain for one week burping  She was seen as a in-patient for 4 days at [facility]  And she still cannot do anything  About the pain and she cannot burp

## 2015-11-23 NOTE — ED Provider Notes (Signed)
[facility]-EMERGENCY DEPT Provider Note   CSN: [id number] Arrival date & time: 11/23/15  1819     History   Chief Complaint Chief Complaint  Patient presents with  . Abdominal Pain    HPI Lauren Bolton is a 55 y.o. female.  HPI   Patient is a 55 year old female presenting with inability to eat. Patient has had this for the last 3 months. [facility] Patient had normal endoscopy, normal CT scan, normal labs at that time. Today fashion patient has mildly lowered potassium. Her potassium is 2.8. Patient has been reports that she has been trying to take Gas-X, MiraLAX at home to help with the symptoms.   Patient has followed up with his primary care physician who has increased her thyroid medicine to 100 g.  She has follow-up on tDecemberr 6 with her primary care physician.    Past Medical History:  Diagnosis Date  . Iron deficiency anemia   . Thyroid disease     Patient Active Problem List   Diagnosis Date Noted  . Hypokalemia 11/11/2015  . Iron deficiency anemia 11/11/2015  . Malaise and fatigue 11/11/2015  . Failure to thrive in adult 11/11/2015  . Protein-calorie malnutrition, severe 11/11/2015  . Nausea with vomiting 11/11/2015  . Anxiety state   . Decreased oral intake   . Hypothyroidism 11/10/2015    Past Surgical History:  Procedure Laterality Date  . ESOPHAGOGASTRODUODENOSCOPY N/A 11/13/2015   Procedure: ESOPHAGOGASTRODUODENOSCOPY (EGD);  Surgeon: Dorena CookeyJohn Hayes, MD;  [facility];  Service: Endoscopy;  Laterality: N/A;  . thyroid goiter      OB History    No data available       Home Medications    Prior to Admission medications   Medication Sig Start Date End Date Taking? Authorizing Provider  ALPRAZolam (XANAX) 0.25 MG tablet Take 0.25 mg by mouth 3 (three) times daily. 10/23/15   Historical Provider, MD  levothyroxine (SYNTHROID) 100 MCG tablet Take 1 tablet (100 mcg total) by mouth daily before  breakfast. 11/14/15   Starleen Armsawood S Elgergawy, MD  omeprazole (PRILOSEC) 40 MG capsule Take 40 mg by mouth daily. 10/29/15   Historical Provider, MD  ondansetron (ZOFRAN) 4 MG tablet Take 1 tablet (4 mg total) by mouth every 6 (six) hours. Patient not taking: Reported on 11/10/2015 11/07/15   Eyvonne MechanicJeffrey Hedges, PA-C  POLY-IRON 150 150 MG capsule Take 150 mg by mouth daily. 09/22/15   Historical Provider, MD    Family History No family history on file.  Social History Social History  Substance Use Topics  . Smoking status: Never Smoker  . Smokeless tobacco: Never Used  . Alcohol use No     Allergies   Benadryl [diphenhydramine hcl]   Review of Systems Review of Systems  Constitutional: Positive for fatigue. Negative for fever.  Gastrointestinal: Positive for abdominal pain, constipation, nausea and vomiting.  Endocrine: Positive for cold intolerance.  Psychiatric/Behavioral: Positive for suicidal ideas. Negative for self-injury. The patient is nervous/anxious.   All other systems reviewed and are negative.    Physical Exam Updated Vital Signs BP 158/89   Pulse 73   Temp 98 F (36.7 C)   Resp 16   Ht 5' (1.524 m)   Wt 97 lb (44 kg)   SpO2 100%   BMI 18.94 kg/m   Physical Exam  Constitutional: She is oriented to person, place, and time. She appears well-developed and well-nourished.  Thin female in no acute distress.  HENT:  Head: Normocephalic and atraumatic.  Eyes: Right eye exhibits no discharge.  Cardiovascular: Normal rate, regular rhythm and normal heart sounds.   No murmur heard. Pulmonary/Chest: Effort normal and breath sounds normal. She has no wheezes. She has no rales.  Abdominal: Soft. She exhibits no distension. There is no tenderness.  Neurological: She is oriented to person, place, and time.  Skin: Skin is warm and dry. She is not diaphoretic.  Psychiatric: She has a normal mood and affect.  Mildly tearful, denies SI or HI.  Nursing note and vitals  reviewed.    ED Treatments / Results  Labs (all labs ordered are listed, but only abnormal results are displayed) Labs Reviewed  COMPREHENSIVE METABOLIC PANEL - Abnormal; Notable for the following:       Result Value   Sodium 134 (*)    Potassium 2.8 (*)    Chloride 99 (*)    BUN <5 (*)    All other components within normal limits  CBC - Abnormal; Notable for the following:    Hemoglobin 10.1 (*)    HCT 30.0 (*)    MCV 73.9 (*)    MCH 24.9 (*)    All other components within normal limits  URINALYSIS, ROUTINE W REFLEX MICROSCOPIC (NOT AT [facility]) - Abnormal; Notable for the following:    Specific Gravity, Urine 1.002 (*)    Leukocytes, UA MODERATE (*)    All other components within normal limits  URINE MICROSCOPIC-ADD ON - Abnormal; Notable for the following:    Squamous Epithelial / LPF 0-5 (*)    Bacteria, UA RARE (*)    All other components within normal limits  LIPASE, BLOOD    EKG  EKG Interpretation None       Radiology No results found.  Procedures Procedures (including critical care time)  Medications Ordered in ED Medications  potassium chloride 20 MEQ/15ML (10%) solution 40 mEq (not administered)  dextrose 5 % and 0.45 % NaCl with KCl 40 mEq/L infusion (not administered)  gi cocktail (Maalox,Lidocaine,Donnatal) (not administered)     Initial Impression / Assessment and Plan / ED Course  I have reviewed the triage vital signs and the nursing notes.  Pertinent labs & imaging results that were available during my care of the patient were reviewed by me and considered in my medical decision making (see chart for details).  Clinical Course     Patient is a 56 year old female presenting with 3 months of abdominal pain and not eating. Patient had 4 day [place]  [facility] stay within the last 2 weeks. At that time she had normal endoscopy normal CT scan. She continues to have the same symptoms. Today isolatedfinding of low potassium. Patient has not been taking  potassium home.   Patient has moist because membranes no tachycardia and appears in no data acute distress.  I don't know what further to do for patient's evaluation in the emergency department. These are all old findings going on for the last 3 months.I think there is a psychiatric component to these symptoms. She is tearful, and not eating.  Patient was on Zoloft but she said that it made "after 1 pill at it made my blood pressure too high".  She is very tearful and bursts into tears when asked a question and "do you feel like you want to hurt yourself". She said that yes and that she told her husband earlier today. The patient may meet inpatient requirement for refeeding.   We'll have TTS evaluate  the patient.   Final Clinical Impressions(s) / ED Diagnoses   Final diagnoses:  None    New Prescriptions New Prescriptions   No medications on file     Santasia Rew Randall AnLyn Keishawn Darsey, MD 11/23/15 2302

## 2015-11-23 NOTE — ED Triage Notes (Signed)
She has had this pain since june

## 2015-11-23 NOTE — ED Notes (Signed)
[facility]  Pt continues to ask to take her Zanax.

## 2015-11-24 LAB — POTASSIUM: POTASSIUM: 4.4 mmol/L (ref 3.5–5.1)

## 2015-11-24 LAB — TSH: TSH: 3.136 u[IU]/mL (ref 0.350–4.500)

## 2015-11-24 NOTE — ED Provider Notes (Signed)
Care assumed from Dr. Corlis LeakMacKuen.  Patient with acute on chronic abdominal pain and decreased appetite with negative inpatient workup recently. Presents with depression and tearfulness. TTS consult pending and replacement of potassium pending.  TTS consult complete. Patient does not meet inpatient criteria. She'll be given outpatient resources. She is not homicidal or suicidal  Potassium is normalized. TSH is normal as well. Continue her medications and follow-up with her PCP.  Patient stable for discharge per plan established by Dr. Corlis LeakMackuen.  BP 141/85   Pulse 71   Temp 98 F (36.7 C)   Resp 16   Ht 5' (1.524 m)   Wt 97 lb (44 kg)   SpO2 100%   BMI 18.94 kg/m     Lauren OctaveStephen Anjelika Ausburn, MD 11/24/15 [phone]

## 2015-11-24 NOTE — [facility] Counselor (Signed)
Notified Victorino DikeJennifer, RN and EDP of decision for pt to be discharged with outpatient resources per Nira ConnJason Berry, NP  Orlie PollenAshley Charrie Mcconnon, [facility], [facility],  [facility]-A Therapeutic Triage Specialist  11/24/2015 12:58 AM

## 2015-11-24 NOTE — BH Assessment (Addendum)
Tele Assessment Note   Lauren Bolton is a 55 y.o. female who presents voluntarily to the [facility] accompanied by her husband. Pt's husband was present during assessment with pt's permission. Pt states she is having sadness and depressed thoughts due to unknown illness. Pt states she does not have any desires or thoughts of wanting to harm or kill herself. Pt is worried she will die soon. Pt states she does not have an appetite and does not eat regularly. Pt cannot identify specific reason for not eating. Pt denies Hi, abuse history, AV hallucinations, substance use, and any prior inpatient hospitalization.  Pt states she currently has a psychiatrist, Dr. Ezzie DuralSal and sees a therapist Aundra MilletMegan. Pt is unsure the name of the facility.    Pt lives at home with her husband and son. Pt states her primary stressor is her abdominal pain.  Pt's husband reports it is safe for her to come back home and she has no access to weapons. Pt's husband states he also has tried to get her to eat and to take care of herself again. Pt denies having issues with ADLs.  Pt states she is tearful, feels tired, and has thoughts of worthlessness often due to illness.  Pt states she has lost 5lbs recently and only sleeps for 5-6 hours a night.   Pt was dressed in a hospital gown. Pt was alert and oriented X4. Pt's mood was depressed and frustrated and her affect was congruent with mood. Pt's thought process was impaired due to confusion and frustration with not knowing what is wrong with her. Pt states just wanting to feel better.       Diagnosis: Major Depressive Disorder, Single, Moderate   Past Medical History:  Past Medical History:  Diagnosis Date  . Iron deficiency anemia   . Thyroid disease     Past Surgical History:  Procedure Laterality Date  . ESOPHAGOGASTRODUODENOSCOPY N/A 11/13/2015   Procedure: ESOPHAGOGASTRODUODENOSCOPY (EGD);  Surgeon: Dorena CookeyJohn Hayes, MD;  Location: [facility];  Service: Endoscopy;  Laterality: N/A;  .  thyroid goiter      Family History: No family history on file.  Social History:  reports that she has never smoked. She has never used smokeless tobacco. She reports that she does not drink alcohol or use drugs.  Additional Social History:  Alcohol / Drug Use Pain Medications: SEE MAR Prescriptions: SEE MAR Over the Counter: SEE MAR History of alcohol / drug use?: No history of alcohol / drug abuse  CIWA: CIWA-Ar BP: 141/85 Pulse Rate: 71 COWS:    PATIENT STRENGTHS: (choose at least two) Ability for insight Average or above average intelligence Communication skills General fund of knowledge Motivation for treatment/growth Physical Health  Allergies:  Allergies  Allergen Reactions  . Benadryl [Diphenhydramine Hcl] Diarrhea and Other (See Comments)    EXCESSIVE SLEEPINESS    Home Medications:  (Not in a hospital admission)  OB/GYN Status:  No LMP recorded. Patient is postmenopausal.  General Assessment Data [facility] Assessment: In system Is this a Tele or Face-to-Face Assessment?: Tele Assessment Is this an Initial Assessment or a Re-assessment for this encounter?: Initial Assessment Marital status: Mar[place] Is patient pregnant?: No Pregnancy Status: No Living Arrangements: Spouse/significant other, Children Can pt return to current living arrangement?: Yes Admission Status: Voluntary Is patient capable of signing voluntary admission?: Yes Referral Source: Self/Family/Friend Insurance type: [employer] BS  Medical Screening Exam [facility]-in ONLY) Medical Exam completed: Yes  Crisis Care Plan  Living Arrangements: Spouse/significant other, Children Legal Guardian: Other: (self) Name of Psychiatrist: Dr. Ezzie Dural Name of Therapist: none  Education Status Is patient currently in school?: No Current Grade: n/a Highest grade of school patient has completed: unknown due to another country Name of school: unknown Contact  person: n/a  Risk to self with the past 6 months Suicidal Ideation: No Has patient been a risk to self within the past 6 months prior to admission? : No Suicidal Intent: No Has patient had any suicidal intent within the past 6 months prior to admission? : No Is patient at risk for suicide?: No Suicidal Plan?: No Has patient had any suicidal plan within the past 6 months prior to admission? : No Access to Means: No What has been your use of drugs/alcohol within the last 12 months?: n/a Previous Attempts/Gestures: No How many times?: 0 Other Self Harm Risks: 0 Triggers for Past Attempts: None known Intentional Self Injurious Behavior: None Family Suicide History: Unknown Recent stressful life event(s):  (unknown illness) Persecutory voices/beliefs?: No Depression: Yes Depression Symptoms: Tearfulness, Isolating, Fatigue Substance abuse history and/or treatment for substance abuse?: No Suicide prevention information given to non-admitted patients: Not applicable  Risk to Others within the past 6 months Homicidal Ideation: No Does patient have any lifetime risk of violence toward others beyond the six months prior to admission? : No Thoughts of Harm to Others: No Current Homicidal Intent: No Current Homicidal Plan: No Access to Homicidal Means: No Identified Victim: n/a History of harm to others?: No Assessment of Violence: None Noted Does patient have access to weapons?: No Criminal Charges Pending?: No Does patient have a court date: No Is patient on probation?: No  Psychosis Hallucinations: None noted Delusions: None noted  Mental Status Report Appearance/Hygiene: In scrubs Eye Contact: Good Motor Activity: Unremarkable Speech: Logical/coherent Level of Consciousness: Alert Mood: Sad Affect: Sad Anxiety Level: Moderate Thought Processes: Coherent Judgement: Impaired Orientation: Person, Place, Time, Situation, Appropriate for developmental age Obsessive  Compulsive Thoughts/Behaviors: None  Cognitive Functioning Concentration: Fair Memory: Recent Intact, Remote Intact IQ: Average Insight: Fair Impulse Control: Good Appetite: Poor Weight Loss: 6 Weight Gain: 0 Sleep: Decreased Total Hours of Sleep: 5 Vegetative Symptoms: None  ADLScreening [facility] Assessment Services) Patient's cognitive ability adequate to safely complete daily activities?: Yes Patient able to express need for assistance with ADLs?: Yes Independently performs ADLs?: Yes (appropriate for developmental age)  Prior Inpatient Therapy Prior Inpatient Therapy: No  Prior Outpatient Therapy Prior Outpatient Therapy: No Does patient have an ACCT team?: No Does patient have Intensive In-House Services?  : No Does patient have [facility] services? : No Does patient have P4CC services?: No  ADL Screening (condition at time of admission) Patient's cognitive ability adequate to safely complete daily activities?: Yes Is the patient deaf or have difficulty hearing?: No Does the patient have difficulty seeing, even when wearing glasses/contacts?: No Does the patient have difficulty concentrating, remembering, or making decisions?: No Patient able to express need for assistance with ADLs?: Yes Does the patient have difficulty dressing or bathing?: No Independently performs ADLs?: Yes (appropriate for developmental age) Weakness of Legs: Both Weakness of Arms/Hands: Both  Home Assistive Devices/Equipment Home Assistive Devices/Equipment: None    Abuse/Neglect Assessment (Assessment to be complete while patient is alone) Physical Abuse: Denies Verbal Abuse: Denies Sexual Abuse: Denies Exploitation of patient/patient's resources: Denies Values / Beliefs Cultural Requests During Hospitalization: None Spiritual Requests During Hospitalization: None   Advance Directives (For Healthcare) Does patient have an advance directive?:  No Would patient like information on creating  an advanced directive?: No - patient declined information    Additional Information 1:1 In Past 12 Months?: No CIRT Risk: No Elopement Risk: No Does patient have medical clearance?: Yes     Disposition:  Gave clinical report to Nira ConnJason Berry, NP who states pt does not meet inpatient criteria. Pt can be discharged with follow up for outpatient resources once medically cleared.     Morrie Sheldonshley n Heavan Francom 11/24/2015 12:08 AM

## 2015-11-24 NOTE — Discharge Instructions (Signed)
Take your medications as prescribed. Followup with Dr. Evlyn [facility] to recheck your thyroid. Return to the ED if you develop thoughts of wanting to hurt yourself or anyone else.

## 2015-11-26 DIAGNOSIS — E876 Hypokalemia: Secondary | ICD-10-CM | POA: Diagnosis not present

## 2015-11-26 DIAGNOSIS — R634 Abnormal weight loss: Secondary | ICD-10-CM | POA: Diagnosis not present

## 2015-11-26 DIAGNOSIS — R5381 Other malaise: Secondary | ICD-10-CM | POA: Diagnosis not present

## 2015-11-26 DIAGNOSIS — E890 Postprocedural hypothyroidism: Secondary | ICD-10-CM | POA: Diagnosis not present

## 2015-12-01 ENCOUNTER — Encounter (HOSPITAL_COMMUNITY): Payer: Self-pay

## 2015-12-01 ENCOUNTER — Emergency Department (HOSPITAL_COMMUNITY)
Admission: EM | Admit: 2015-12-01 | Discharge: 2015-12-01 | Disposition: A | Discharge status: Home / Self Care | Payer: BLUE CROSS/BLUE SHIELD | Attending: Emergency Medicine | Admitting: Emergency Medicine

## 2015-12-01 DIAGNOSIS — E876 Hypokalemia: Secondary | ICD-10-CM | POA: Insufficient documentation

## 2015-12-01 DIAGNOSIS — E039 Hypothyroidism, unspecified: Secondary | ICD-10-CM | POA: Insufficient documentation

## 2015-12-01 DIAGNOSIS — R700 Elevated erythrocyte sedimentation rate: Secondary | ICD-10-CM | POA: Diagnosis not present

## 2015-12-01 DIAGNOSIS — Z79899 Other long term (current) drug therapy: Secondary | ICD-10-CM | POA: Insufficient documentation

## 2015-12-01 DIAGNOSIS — R531 Weakness: Secondary | ICD-10-CM | POA: Insufficient documentation

## 2015-12-01 DIAGNOSIS — E860 Dehydration: Secondary | ICD-10-CM | POA: Diagnosis not present

## 2015-12-01 DIAGNOSIS — R112 Nausea with vomiting, unspecified: Secondary | ICD-10-CM | POA: Diagnosis not present

## 2015-12-01 DIAGNOSIS — N186 End stage renal disease: Secondary | ICD-10-CM | POA: Diagnosis not present

## 2015-12-01 LAB — URINALYSIS, ROUTINE W REFLEX MICROSCOPIC
Bilirubin Urine: NEGATIVE
GLUCOSE, UA: NEGATIVE mg/dL
Hgb urine dipstick: NEGATIVE
KETONES UR: NEGATIVE mg/dL
NITRITE: NEGATIVE
PH: 6.5 (ref 5.0–8.0)
Protein, ur: NEGATIVE mg/dL
SPECIFIC GRAVITY, URINE: 1.005 (ref 1.005–1.030)

## 2015-12-01 LAB — COMPREHENSIVE METABOLIC PANEL
ALBUMIN: 4.4 g/dL (ref 3.5–5.0)
ALT: 27 U/L (ref 14–54)
ANION GAP: 10 (ref 5–15)
AST: 35 U/L (ref 15–41)
Alkaline Phosphatase: 68 U/L (ref 38–126)
CHLORIDE: 96 mmol/L — AB (ref 101–111)
CO2: 27 mmol/L (ref 22–32)
Calcium: 9.2 mg/dL (ref 8.9–10.3)
Creatinine, Ser: 0.7 mg/dL (ref 0.44–1.00)
GFR calc Af Amer: >60 mL/min (ref >60)
GFR calc non Af Amer: >60 mL/min (ref >60)
GLUCOSE: 121 mg/dL — AB (ref 65–99)
POTASSIUM: 2.6 mmol/L — AB (ref 3.5–5.1)
Sodium: 133 mmol/L — ABNORMAL LOW (ref 135–145)
Total Bilirubin: 0.7 mg/dL (ref 0.3–1.2)
Total Protein: 8.2 g/dL — ABNORMAL HIGH (ref 6.5–8.1)

## 2015-12-01 LAB — SEDIMENTATION RATE: SED RATE: 60 mm/h — AB (ref 0–22)

## 2015-12-01 LAB — HCG, QUANTITATIVE, PREGNANCY: hCG, Beta Chain, Quant, S: 8 m[IU]/mL — ABNORMAL HIGH (ref <5)

## 2015-12-01 LAB — CBC
HEMATOCRIT: 32.8 % — AB (ref 36.0–46.0)
HEMOGLOBIN: 11 g/dL — AB (ref 12.0–15.0)
MCH: 25.3 pg — ABNORMAL LOW (ref 26.0–34.0)
MCHC: 33.5 g/dL (ref 30.0–36.0)
MCV: 75.4 fL — AB (ref 78.0–100.0)
Platelets: 310 10*3/uL (ref 150–400)
RBC: 4.35 MIL/uL (ref 3.87–5.11)
RDW: 14.3 % (ref 11.5–15.5)
WBC: 5.3 10*3/uL (ref 4.0–10.5)

## 2015-12-01 LAB — TSH: TSH: 0.835 u[IU]/mL (ref 0.350–4.500)

## 2015-12-01 LAB — URINE MICROSCOPIC-ADD ON
BACTERIA UA: NONE SEEN
RBC / HPF: NONE SEEN RBC/hpf (ref 0–5)
SQUAMOUS EPITHELIAL / LPF: NONE SEEN

## 2015-12-01 LAB — LIPASE, BLOOD: LIPASE: 26 U/L (ref 11–51)

## 2015-12-01 MED ORDER — ONDANSETRON 4 MG PO TBDP
4.0000 mg | ORAL_TABLET | Freq: Once | ORAL | Status: AC
  Administered 2015-12-01: 4 mg via ORAL
  Filled 2015-12-01: qty 1

## 2015-12-01 MED ORDER — ONDANSETRON 4 MG PO TBDP
4.0000 mg | ORAL_TABLET | Freq: Once | ORAL | Status: AC | PRN
  Administered 2015-12-01: 4 mg via ORAL
  Filled 2015-12-01: qty 1

## 2015-12-01 MED ORDER — PREDNISONE 5 MG PO TABS: 5.0000 mg | ORAL_TABLET | Freq: Every day | ORAL | 0 refills | Status: DC

## 2015-12-01 MED ORDER — PREDNISONE 5 MG PO TABS
5.0000 mg | ORAL_TABLET | Freq: Once | ORAL | Status: AC
  Administered 2015-12-01: 5 mg via ORAL
  Filled 2015-12-01 (×2): qty 1

## 2015-12-01 MED ORDER — SODIUM CHLORIDE 0.9 % IV BOLUS (SEPSIS)
1000.0000 mL | Freq: Once | INTRAVENOUS | Status: AC
  Administered 2015-12-01: 1000 mL via INTRAVENOUS

## 2015-12-01 MED ORDER — METHYLPREDNISOLONE SODIUM SUCC 125 MG IJ SOLR: 125.0000 mg | Freq: Once | INTRAMUSCULAR | Status: DC

## 2015-12-01 MED ORDER — POTASSIUM CHLORIDE 20 MEQ/15ML (10%) PO SOLN
40.0000 meq | Freq: Once | ORAL | Status: AC
  Administered 2015-12-01: 40 meq via ORAL
  Filled 2015-12-01: qty 30

## 2015-12-01 MED ORDER — POTASSIUM CHLORIDE CRYS ER 20 MEQ PO TBCR: 40.0000 meq | EXTENDED_RELEASE_TABLET | Freq: Once | ORAL | Status: DC

## 2015-12-01 MED ORDER — ONDANSETRON HCL 4 MG PO TABS: 4.0000 mg | ORAL_TABLET | Freq: Four times a day (QID) | ORAL | 0 refills | Status: DC

## 2015-12-01 MED ORDER — FAMOTIDINE IN NACL 20-0.9 MG/50ML-% IV SOLN
20.0000 mg | Freq: Once | INTRAVENOUS | Status: AC
  Administered 2015-12-01: 20 mg via INTRAVENOUS
  Filled 2015-12-01: qty 50

## 2015-12-01 NOTE — ED Triage Notes (Signed)
PT C/O CONSTANT NAUSEA SINCE LAST NIGHT. PT STS SHE DIDN'T EAT ANYTHING NEW, AND NO ONE ELSE HAS THESE SYMPTOMS. PT DENIES FEVER, DIARRHEA, OR VOMITING. PT STS SHE HAS NOT BEEN ABLE TO EAT NORMALLY FOR THE LAST 3 MONTHS. SHE HAS SEEN HER PCP MULTIPLE TIMES, BUT SHE STS "I THINK THEY ARE TIRED OF ME."

## 2015-12-01 NOTE — ED Provider Notes (Signed)
[facility] DEPT Provider Note   CSN: [id number] Arrival date & time: 12/01/15  1216   History   Chief Complaint Chief Complaint  Patient presents with  . Emesis  . Weakness    HPI Lauren Bolton is a 55 y.o. female with complicated medical history who presents with nausea and vomiting. PMH significant for recurrent nausea, failure to thrive, iron deficiency, hypokalemia, malnutrition, anxiety/depression. Patient and husband at bedside help provide history. She has had generalized weakness, fatigue, and unintentional weight loss since this past July. She has been evaluated by multiple providers without a good reason for her symptoms. She was last admitted from 11/5-11/9 for severe hypothyroidism (TSH was noted to be 26 during that admission). She had an EGD which was normal. CT A&P on 11/6 was negative. She has since had TSH rechecked one week ago which was normal. She is only to tolerate a small amount of food for many months now. She normally eats chicken broth or rice water. She reports associated blurry vision, dry eyes, dry mouth and throat, skin tightness, nausea, and mild abdominal pain. She had multiple episodes of emesis and dry heaves starting yesterday. She has been taking all of her medicines as prescribed without any relief of her symptoms. Denies fever, chills, chest pain, SOB, diarrhea/constipation, urinary or vaginal symptoms.  HPI  Past Medical History:  Diagnosis Date  . Iron deficiency anemia   . Thyroid disease     Patient Active Problem List   Diagnosis Date Noted  . Hypokalemia 11/11/2015  . Iron deficiency anemia 11/11/2015  . Malaise and fatigue 11/11/2015  . Failure to thrive in adult 11/11/2015  . Protein-calorie malnutrition, severe 11/11/2015  . Nausea with vomiting 11/11/2015  . Anxiety state   . Decreased oral intake   . Hypothyroidism 11/10/2015    Past Surgical History:  Procedure Laterality Date  . ESOPHAGOGASTRODUODENOSCOPY N/A 11/13/2015   Procedure: ESOPHAGOGASTRODUODENOSCOPY (EGD);  Surgeon: Teena Irani, MD;  Location: [facility];  Service: Endoscopy;  Laterality: N/A;  . thyroid goiter      OB History    No data available       Home Medications    Prior to Admission medications   Medication Sig Start Date End Date Taking? Authorizing Provider  ALPRAZolam (XANAX) 0.25 MG tablet Take 0.25 mg by mouth 3 (three) times daily. 10/23/15   Historical Provider, MD  docusate sodium (COLACE) 100 MG capsule Take 100 mg by mouth daily as needed for mild constipation.    Historical Provider, MD  levothyroxine (SYNTHROID) 100 MCG tablet Take 1 tablet (100 mcg total) by mouth daily before breakfast. 11/14/15   Albertine Patricia, MD  omeprazole (PRILOSEC) 40 MG capsule Take 40 mg by mouth daily. 10/29/15   Historical Provider, MD  POLY-IRON 150 150 MG capsule Take 150 mg by mouth 2 (two) times daily.  09/22/15   Historical Provider, MD  simethicone (GAS-X) 80 MG chewable tablet Chew 80 mg by mouth every 6 (six) hours as needed for flatulence.    Historical Provider, MD    Family History History reviewed. No pertinent family history.  Social History Social History  Substance Use Topics  . Smoking status: Never Smoker  . Smokeless tobacco: Never Used  . Alcohol use No     Allergies   Benadryl [diphenhydramine hcl]   Review of Systems Review of Systems  Constitutional: Positive for appetite change, fatigue and unexpected weight change. Negative for chills and fever.  HENT:  Dry mouth  Eyes: Positive for visual disturbance (Dry eyes).  Respiratory: Negative for shortness of breath.   Cardiovascular: Negative for chest pain.  Gastrointestinal: Positive for abdominal pain, nausea and vomiting. Negative for blood in stool, constipation and diarrhea.  Genitourinary: Negative for dysuria, flank pain, vaginal bleeding and vaginal discharge.  Neurological: Positive for weakness.     Physical Exam Updated Vital  Signs BP 151/98 (BP Location: Right Arm)   Pulse 101   Temp 98.1 F (36.7 C) (Oral)   Resp 16   Ht 5' (1.524 m)   Wt 39.9 kg   SpO2 100%   BMI 17.19 kg/m   Physical Exam  Constitutional: She is oriented to person, place, and time. She appears well-developed and well-nourished. No distress.  Appears fatigued  HENT:  Head: Normocephalic and atraumatic.  Dry mucous membranes  Eyes: Conjunctivae are normal. Pupils are equal, round, and reactive to light. Right eye exhibits no discharge. Left eye exhibits no discharge. No scleral icterus.  Neck: Normal range of motion.  Cardiovascular: Normal rate and regular rhythm.  Exam reveals no gallop and no friction rub.   No murmur heard. Pulmonary/Chest: Effort normal and breath sounds normal. No respiratory distress. She has no wheezes. She has no rales. She exhibits no tenderness.  Abdominal: Soft. Bowel sounds are normal. She exhibits no distension and no mass. There is no tenderness. There is no rebound and no guarding. No hernia.  Neurological: She is alert and oriented to person, place, and time.  Skin: Skin is warm and dry.  Hyperpigmented, macular rash on bilateral cheeks  Psychiatric: She is withdrawn. She exhibits a depressed mood.  Nursing note and vitals reviewed.    ED Treatments / Results  Labs (all labs ordered are listed, but only abnormal results are displayed) Labs Reviewed  COMPREHENSIVE METABOLIC PANEL - Abnormal; Notable for the following:       Result Value   Sodium 133 (*)    Potassium 2.6 (*)    Chloride 96 (*)    Glucose, Bld 121 (*)    BUN <5 (*)    Total Protein 8.2 (*)    All other components within normal limits  CBC - Abnormal; Notable for the following:    Hemoglobin 11.0 (*)    HCT 32.8 (*)    MCV 75.4 (*)    MCH 25.3 (*)    All other components within normal limits  URINALYSIS, ROUTINE W REFLEX MICROSCOPIC (NOT AT [facility]) - Abnormal; Notable for the following:    Leukocytes, UA SMALL (*)    All  other components within normal limits  HCG, QUANTITATIVE, PREGNANCY - Abnormal; Notable for the following:    hCG, Beta Chain, Quant, S 8 (*)    All other components within normal limits  LIPASE, BLOOD  URINE MICROSCOPIC-ADD ON  TSH    EKG  EKG Interpretation None       Radiology No results found.  Procedures Procedures (including critical care time)  Medications Ordered in ED Medications  ondansetron (ZOFRAN-ODT) disintegrating tablet 4 mg (4 mg Oral Given 12/01/15 1240)  sodium chloride 0.9 % bolus 1,000 mL (0 mLs Intravenous Stopped 12/01/15 1720)  famotidine (PEPCID) IVPB 20 mg premix (0 mg Intravenous Stopped 12/01/15 1720)  potassium chloride 20 MEQ/15ML (10%) solution 40 mEq (40 mEq Oral Given 12/01/15 1608)  predniSONE (DELTASONE) tablet 5 mg (5 mg Oral Given 12/01/15 1718)  ondansetron (ZOFRAN-ODT) disintegrating tablet 4 mg (4 mg Oral Given 12/01/15 1835)  Initial Impression / Assessment and Plan / ED Course  I have reviewed the triage vital signs and the nursing notes.  Pertinent labs & imaging results that were available during my care of the patient were reviewed by me and considered in my medical decision making (see chart for details).  Clinical Course    55 year old female presents with mostly chronic issues. Ongoing nausea now with vomiting. Abdominal exam is benign. Patient is afebrile, not tachycardic or tachypneic, normotensive, and not hypoxic. CBC remarkable for mild anemia improved from baseline. CMP remarkable for hyponatremia (133), hypokalemia (2.6), hypochloremia (96), glucose 121. UA clean. TSH rechecked and normal. Her symptoms are somewhat suspicious for Lupus. She has a rash on her face and complains of skin tightness, dry eyes, dry mouth. ESR is elevated at 60. ANA and CRP are pending.  IVF, pepcid, potassium, and prednisone given. Zofran given for nausea. She is able to tolerate PO. Has appt with her PCP in early Dec. Opportunity for  questions provided and all questions answered. Return precautions given.  Final Clinical Impressions(s) / ED Diagnoses   Final diagnoses:  Dehydration  Weakness  ESR raised  Hypokalemia    New Prescriptions New Prescriptions   No medications on file     Recardo Evangelist, PA-C 12/01/15 2002    Charlesetta Shanks, MD 12/02/15 0020

## 2015-12-02 LAB — C-REACTIVE PROTEIN

## 2015-12-03 DIAGNOSIS — E876 Hypokalemia: Secondary | ICD-10-CM | POA: Diagnosis not present

## 2015-12-03 DIAGNOSIS — F419 Anxiety disorder, unspecified: Secondary | ICD-10-CM | POA: Diagnosis not present

## 2015-12-03 LAB — ANTINUCLEAR ANTIBODIES, IFA: ANTINUCLEAR ANTIBODIES, IFA: NEGATIVE

## 2015-12-13 ENCOUNTER — Encounter (HOSPITAL_COMMUNITY): Payer: Self-pay | Admitting: Emergency Medicine

## 2015-12-13 DIAGNOSIS — Z79899 Other long term (current) drug therapy: Secondary | ICD-10-CM | POA: Diagnosis not present

## 2015-12-13 DIAGNOSIS — R112 Nausea with vomiting, unspecified: Secondary | ICD-10-CM | POA: Diagnosis not present

## 2015-12-13 DIAGNOSIS — E039 Hypothyroidism, unspecified: Secondary | ICD-10-CM | POA: Diagnosis not present

## 2015-12-13 DIAGNOSIS — R1110 Vomiting, unspecified: Secondary | ICD-10-CM | POA: Diagnosis not present

## 2015-12-13 DIAGNOSIS — R5383 Other fatigue: Secondary | ICD-10-CM | POA: Insufficient documentation

## 2015-12-13 DIAGNOSIS — R5381 Other malaise: Secondary | ICD-10-CM | POA: Diagnosis not present

## 2015-12-13 LAB — CBC WITH DIFFERENTIAL/PLATELET
BASOS ABS: 0 10*3/uL (ref 0.0–0.1)
BASOS PCT: 0 %
Eosinophils Absolute: 0.1 10*3/uL (ref 0.0–0.7)
Eosinophils Relative: 1 %
HEMATOCRIT: 31.9 % — AB (ref 36.0–46.0)
HEMOGLOBIN: 10.6 g/dL — AB (ref 12.0–15.0)
LYMPHS PCT: 30 %
Lymphs Abs: 1.6 10*3/uL (ref 0.7–4.0)
MCH: 24.9 pg — ABNORMAL LOW (ref 26.0–34.0)
MCHC: 33.2 g/dL (ref 30.0–36.0)
MCV: 75.1 fL — AB (ref 78.0–100.0)
MONO ABS: 0.5 10*3/uL (ref 0.1–1.0)
Monocytes Relative: 10 %
NEUTROS ABS: 3.3 10*3/uL (ref 1.7–7.7)
NEUTROS PCT: 59 %
Platelets: 208 10*3/uL (ref 150–400)
RBC: 4.25 MIL/uL (ref 3.87–5.11)
RDW: 14.2 % (ref 11.5–15.5)
WBC: 5.5 10*3/uL (ref 4.0–10.5)

## 2015-12-13 LAB — COMPREHENSIVE METABOLIC PANEL
ALBUMIN: 4.2 g/dL (ref 3.5–5.0)
ALK PHOS: 67 U/L (ref 38–126)
ALT: 22 U/L (ref 14–54)
AST: 27 U/L (ref 15–41)
Anion gap: 9 (ref 5–15)
CALCIUM: 9.4 mg/dL (ref 8.9–10.3)
CO2: 26 mmol/L (ref 22–32)
CREATININE: 0.75 mg/dL (ref 0.44–1.00)
Chloride: 101 mmol/L (ref 101–111)
GFR calc Af Amer: >60 mL/min (ref >60)
GLUCOSE: 93 mg/dL (ref 65–99)
POTASSIUM: 3.1 mmol/L — AB (ref 3.5–5.1)
Sodium: 136 mmol/L (ref 135–145)
TOTAL PROTEIN: 7.5 g/dL (ref 6.5–8.1)
Total Bilirubin: 0.6 mg/dL (ref 0.3–1.2)

## 2015-12-13 LAB — RAPID URINE DRUG SCREEN, HOSP PERFORMED
AMPHETAMINES: NOT DETECTED
Barbiturates: NOT DETECTED
Benzodiazepines: NOT DETECTED
Cocaine: NOT DETECTED
Opiates: NOT DETECTED
Tetrahydrocannabinol: NOT DETECTED

## 2015-12-13 LAB — URINALYSIS, ROUTINE W REFLEX MICROSCOPIC
BILIRUBIN URINE: NEGATIVE
GLUCOSE, UA: NEGATIVE mg/dL
HGB URINE DIPSTICK: NEGATIVE
Ketones, ur: NEGATIVE mg/dL
Leukocytes, UA: NEGATIVE
Nitrite: NEGATIVE
PROTEIN: NEGATIVE mg/dL
Specific Gravity, Urine: 1 — ABNORMAL LOW (ref 1.005–1.030)
pH: 8 (ref 5.0–8.0)

## 2015-12-13 LAB — ETHANOL

## 2015-12-13 NOTE — ED Triage Notes (Signed)
Patient reports chronic / persistent emesis after eating , poor appetite for several months , weight loss , fatigue  and feeling depressed .

## 2015-12-14 ENCOUNTER — Emergency Department (HOSPITAL_COMMUNITY)
Admission: EM | Admit: 2015-12-14 | Discharge: 2015-12-14 | Disposition: A | Discharge status: Home / Self Care | Payer: BLUE CROSS/BLUE SHIELD | Attending: Emergency Medicine | Admitting: Emergency Medicine

## 2015-12-14 DIAGNOSIS — R110 Nausea: Secondary | ICD-10-CM

## 2015-12-14 DIAGNOSIS — R5383 Other fatigue: Secondary | ICD-10-CM

## 2015-12-14 DIAGNOSIS — R5381 Other malaise: Secondary | ICD-10-CM

## 2015-12-14 MED ORDER — ONDANSETRON 4 MG PO TBDP
8.0000 mg | ORAL_TABLET | Freq: Once | ORAL | Status: AC
  Administered 2015-12-14: 8 mg via ORAL
  Filled 2015-12-14: qty 2

## 2015-12-14 MED ORDER — ONDANSETRON 8 MG PO TBDP: 8.0000 mg | ORAL_TABLET | Freq: Three times a day (TID) | ORAL | 0 refills | Status: DC | PRN

## 2015-12-14 NOTE — ED Provider Notes (Signed)
[facility]-EMERGENCY DEPT Provider Note   CSN: [id number] Arrival date & time: 12/13/15  2106  By signing my name below, I, Lauren Bolton, attest that this documentation has been prepared under the direction and in the presence of Lauren Rhineonald Allis Quirarte, MD. Electronically Signed: [name], ED Scribe. 12/14/15. 1:06 AM.   History   Chief Complaint Chief Complaint  Patient presents with  . Poor Appetite    Weight Loss  . Emesis  . Fatigue   After eating, pt feels poking sensation in her abdomen and is unable to keep her food down. Pt has occasional mild headache, constipation, dizziness, no appetite, fatigue and weight loss. Pt occasionally takes a stool softener. Denies fever, chest pain, diarrhea, syncope, visual disturbance. Pt has been to the ED 6x before with the same symptoms and has had no change. Husband states pt's vomiting became worse after endoscopy. Pt went to [place] in 4/17. Pt currently taking Xanax for anxiety. Pt next appointment with PCP on 12/11.   The history is provided by the patient and the spouse. No language interpreter was used.  Emesis   This is a chronic problem. The current episode started more than 1 week ago (In August 2017). Episode frequency: after eating. The problem has not changed since onset.Associated symptoms include headaches. Pertinent negatives include no diarrhea and no fever.   Past Medical History:  Diagnosis Date  . Iron deficiency anemia   . Thyroid disease     Patient Active Problem List   Diagnosis Date Noted  . Hypokalemia 11/11/2015  . Iron deficiency anemia 11/11/2015  . Malaise and fatigue 11/11/2015  . Failure to thrive in adult 11/11/2015  . Protein-calorie malnutrition, severe 11/11/2015  . Nausea with vomiting 11/11/2015  . Anxiety state   . Decreased oral intake   . Hypothyroidism 11/10/2015    Past Surgical History:  Procedure Laterality Date  . ESOPHAGOGASTRODUODENOSCOPY N/A 11/13/2015   Procedure:  ESOPHAGOGASTRODUODENOSCOPY (EGD);  Surgeon: Dorena CookeyJohn Hayes, MD;  [facility];  Service: Endoscopy;  Laterality: N/A;  . thyroid goiter      OB History    No data available     Home Medications    Prior to Admission medications   Medication Sig Start Date End Date Taking? Authorizing Provider  ALPRAZolam (XANAX) 0.25 MG tablet Take 0.25 mg by mouth 3 (three) times daily as needed for anxiety.     Historical Provider, MD  calcium carbonate (TUMS - DOSED IN MG ELEMENTAL CALCIUM) 500 MG chewable tablet Chew 1 tablet by mouth as needed for indigestion or heartburn.    Historical Provider, MD  iron polysaccharides (NIFEREX) 150 MG capsule Take 150 mg by mouth 2 (two) times daily.    Historical Provider, MD  levothyroxine (SYNTHROID) 100 MCG tablet Take 1 tablet (100 mcg total) by mouth daily before breakfast. 11/14/15   Lauren Armsawood S Elgergawy, MD  omeprazole (PRILOSEC) 40 MG capsule Take 40 mg by mouth daily.    Historical Provider, MD  ondansetron (ZOFRAN) 4 MG tablet Take 1 tablet (4 mg total) by mouth every 6 (six) hours. 12/01/15   Lauren BornKelly Marie Gekas, PA-C  predniSONE (DELTASONE) 5 MG tablet Take 1 tablet (5 mg total) by mouth daily. 12/01/15   Lauren BornKelly Marie Gekas, PA-C   Family History No family history on file.  Social History Social History  Substance Use Topics  . Smoking status: Never Smoker  . Smokeless tobacco: Never Used  . Alcohol use No   Allergies   Benadryl [diphenhydramine hcl]  Review of Systems Review of Systems  Constitutional: Positive for appetite change, fatigue and unexpected weight change (5-6 lbs in 4 months, but cannot cross 100 lbs). Negative for fever.  Eyes: Negative for visual disturbance.  Cardiovascular: Negative for chest pain.  Gastrointestinal: Positive for constipation and vomiting. Negative for diarrhea.  Neurological: Positive for dizziness and headaches. Negative for syncope.  All other systems reviewed and are negative.  Physical  Exam Updated Vital Signs BP 119/77 (BP Location: Left Arm)   Pulse 77   Temp 98.2 F (36.8 C) (Oral)   Resp 18   Ht 5\' 2"  (1.575 m)   Wt 91 lb 3 oz (41.4 kg)   SpO2 100%   BMI 16.68 kg/m   Physical Exam CONSTITUTIONAL: Frail and cachetic  HEAD: Normocephalic/atraumatic EYES: EOMI/PERRL ENMT: Mucous membranes dry NECK: supple no meningeal signs SPINE/BACK:entire spine nontender CV: S1/S2 noted, no murmurs/rubs/gallops noted LUNGS: Lungs are clear to auscultation bilaterally, no apparent distress ABDOMEN: soft, nontender, no rebound or guarding, bowel sounds noted throughout abdomen GU:no cva tenderness NEURO: Pt is awake/alert/appropriate, moves all extremitiesx4.  No facial droop.   EXTREMITIES: pulses normal/equal, full ROM SKIN: warm, color normal PSYCH: no abnormalities of mood noted, alert and oriented to situation  ED Treatments / Results  DIAGNOSTIC STUDIES: Oxygen Saturation is 100% on RA, normal by my interpretation.    COORDINATION OF CARE: 1:01 AM Discussed treatment plan with pt at bedside which includes Zofran and fluids and pt agreed to plan.  Labs (all labs ordered are listed, but only abnormal results are displayed) Labs Reviewed  CBC WITH DIFFERENTIAL/PLATELET - Abnormal; Notable for the following:       Result Value   Hemoglobin 10.6 (*)    HCT 31.9 (*)    MCV 75.1 (*)    MCH 24.9 (*)    All other components within normal limits  COMPREHENSIVE METABOLIC PANEL - Abnormal; Notable for the following:    Potassium 3.1 (*)    BUN <5 (*)    All other components within normal limits  URINALYSIS, ROUTINE W REFLEX MICROSCOPIC - Abnormal; Notable for the following:    Color, Urine COLORLESS (*)    Specific Gravity, Urine 1.000 (*)    All other components within normal limits  RAPID URINE DRUG SCREEN, HOSP PERFORMED  ETHANOL    EKG  EKG Interpretation  Date/Time:  Saturday December 14 2015 01:20:00 EST Ventricular Rate:  68 PR Interval:    QRS  Duration: 82 QT Interval:  404 QTC Calculation: 430 R Axis:   82 Text Interpretation:  Sinus rhythm T wave inversion No significant change since last tracing Confirmed by Lauren ShaggyWICKLINE  MD, Faraaz Wolin ([id number]) on 12/14/2015 1:30:08 AM      Radiology No results found.  Procedures Procedures (including critical care time)  Medications Ordered in ED Medications  ondansetron (ZOFRAN-ODT) disintegrating tablet 8 mg (8 mg Oral Given 12/14/15 0127)    Initial Impression / Assessment and Plan / ED Course  I have reviewed the triage vital signs and the nursing notes.  Pertinent labs  results that were available during my care of the patient were reviewed by me and considered in my medical decision making (see chart for details).  Clinical Course    I personally performed the services described in this documentation, which was scribed in my presence. The recorded information has been reviewed and is accurate.      Pt in the ED with recurrent nausea/anorexia She has had multiple ED visits for  this She has had GI evaluation including EGD and also CT scan She has had psych consults This is an ongoing chronic issue.  When asked, they are not really sure why they came back tonight as there is nothing that is new tonight  She drank water in the ED without issue, but reports it is "stuck" in her esophagus, but no vomiting/drooling/choking/coughing.  I assured her that the water has passed She has PCP f/u next week She is appropriate for discharge home She requests another zofran Rx at discharge  Final Clinical Impressions(s) / ED Diagnoses   Final diagnoses:  Malaise and fatigue  Nausea    New Prescriptions Discharge Medication List as of 12/14/2015  3:02 AM       Lauren Rhine, MD 12/14/15 [phone]

## 2015-12-14 NOTE — Discharge Instructions (Signed)

## 2015-12-14 NOTE — ED Notes (Signed)
Pt just called me into the room to let me know that the sip of water she took "isnt going down" Pt denies feeling like she needs to vomit and is speaking clearly. She states "it feels like I need to burp but I cannot"

## 2015-12-14 NOTE — ED Notes (Signed)
Patient is alert and orientedx4.  Patient was explained discharge instructions and they understood them with no questions.   

## 2015-12-16 DIAGNOSIS — R634 Abnormal weight loss: Secondary | ICD-10-CM | POA: Diagnosis not present

## 2015-12-16 DIAGNOSIS — E890 Postprocedural hypothyroidism: Secondary | ICD-10-CM | POA: Diagnosis not present

## 2015-12-16 DIAGNOSIS — F329 Major depressive disorder, single episode, unspecified: Secondary | ICD-10-CM | POA: Diagnosis not present

## 2015-12-16 DIAGNOSIS — D6489 Other specified anemias: Secondary | ICD-10-CM | POA: Diagnosis not present

## 2015-12-16 DIAGNOSIS — R110 Nausea: Secondary | ICD-10-CM | POA: Diagnosis not present

## 2015-12-16 DIAGNOSIS — F418 Other specified anxiety disorders: Secondary | ICD-10-CM | POA: Diagnosis not present

## 2015-12-20 ENCOUNTER — Encounter: Payer: Self-pay | Admitting: Gastroenterology

## 2015-12-23 ENCOUNTER — Emergency Department (HOSPITAL_COMMUNITY): Payer: BLUE CROSS/BLUE SHIELD

## 2015-12-23 ENCOUNTER — Inpatient Hospital Stay (HOSPITAL_COMMUNITY)
Admission: EM | Admit: 2015-12-23 | Discharge: 2015-12-28 | DRG: 391 | Disposition: A | Discharge status: Home / Self Care | Payer: BLUE CROSS/BLUE SHIELD | Attending: Internal Medicine | Admitting: Internal Medicine

## 2015-12-23 ENCOUNTER — Other Ambulatory Visit (HOSPITAL_COMMUNITY): Payer: Self-pay | Admitting: Endocrinology

## 2015-12-23 ENCOUNTER — Observation Stay (HOSPITAL_COMMUNITY): Payer: BLUE CROSS/BLUE SHIELD

## 2015-12-23 ENCOUNTER — Encounter (HOSPITAL_COMMUNITY): Payer: Self-pay | Admitting: *Deleted

## 2015-12-23 DIAGNOSIS — Z681 Body mass index (BMI) 19 or less, adult: Secondary | ICD-10-CM | POA: Diagnosis not present

## 2015-12-23 DIAGNOSIS — F411 Generalized anxiety disorder: Secondary | ICD-10-CM | POA: Diagnosis not present

## 2015-12-23 DIAGNOSIS — D509 Iron deficiency anemia, unspecified: Secondary | ICD-10-CM | POA: Diagnosis not present

## 2015-12-23 DIAGNOSIS — X58XXXA Exposure to other specified factors, initial encounter: Secondary | ICD-10-CM

## 2015-12-23 DIAGNOSIS — F4323 Adjustment disorder with mixed anxiety and depressed mood: Secondary | ICD-10-CM

## 2015-12-23 DIAGNOSIS — E43 Unspecified severe protein-calorie malnutrition: Secondary | ICD-10-CM | POA: Diagnosis not present

## 2015-12-23 DIAGNOSIS — E46 Unspecified protein-calorie malnutrition: Secondary | ICD-10-CM

## 2015-12-23 DIAGNOSIS — Z9889 Other specified postprocedural states: Secondary | ICD-10-CM | POA: Diagnosis not present

## 2015-12-23 DIAGNOSIS — Z888 Allergy status to other drugs, medicaments and biological substances status: Secondary | ICD-10-CM | POA: Diagnosis not present

## 2015-12-23 DIAGNOSIS — K2950 Unspecified chronic gastritis without bleeding: Secondary | ICD-10-CM | POA: Diagnosis present

## 2015-12-23 DIAGNOSIS — R1110 Vomiting, unspecified: Secondary | ICD-10-CM | POA: Diagnosis not present

## 2015-12-23 DIAGNOSIS — F418 Other specified anxiety disorders: Secondary | ICD-10-CM | POA: Diagnosis not present

## 2015-12-23 DIAGNOSIS — R634 Abnormal weight loss: Secondary | ICD-10-CM | POA: Diagnosis not present

## 2015-12-23 DIAGNOSIS — Z9882 Breast implant status: Secondary | ICD-10-CM | POA: Diagnosis not present

## 2015-12-23 DIAGNOSIS — G4700 Insomnia, unspecified: Secondary | ICD-10-CM | POA: Diagnosis not present

## 2015-12-23 DIAGNOSIS — R110 Nausea: Secondary | ICD-10-CM

## 2015-12-23 DIAGNOSIS — Z3202 Encounter for pregnancy test, result negative: Secondary | ICD-10-CM | POA: Diagnosis present

## 2015-12-23 DIAGNOSIS — K2970 Gastritis, unspecified, without bleeding: Secondary | ICD-10-CM | POA: Diagnosis not present

## 2015-12-23 DIAGNOSIS — E876 Hypokalemia: Secondary | ICD-10-CM

## 2015-12-23 DIAGNOSIS — R112 Nausea with vomiting, unspecified: Secondary | ICD-10-CM | POA: Diagnosis not present

## 2015-12-23 DIAGNOSIS — E039 Hypothyroidism, unspecified: Secondary | ICD-10-CM | POA: Diagnosis not present

## 2015-12-23 DIAGNOSIS — R1013 Epigastric pain: Secondary | ICD-10-CM | POA: Diagnosis not present

## 2015-12-23 DIAGNOSIS — D649 Anemia, unspecified: Secondary | ICD-10-CM | POA: Diagnosis not present

## 2015-12-23 DIAGNOSIS — R633 Feeding difficulties: Secondary | ICD-10-CM | POA: Diagnosis not present

## 2015-12-23 LAB — COMPREHENSIVE METABOLIC PANEL
ALBUMIN: 4.2 g/dL (ref 3.5–5.0)
ALK PHOS: 58 U/L (ref 38–126)
ALT: 35 U/L (ref 14–54)
ANION GAP: 10 (ref 5–15)
AST: 36 U/L (ref 15–41)
BUN: <5 mg/dL — ABNORMAL LOW (ref 6–20)
CALCIUM: 9.3 mg/dL (ref 8.9–10.3)
CO2: 28 mmol/L (ref 22–32)
Chloride: 101 mmol/L (ref 101–111)
Creatinine, Ser: 0.82 mg/dL (ref 0.44–1.00)
GFR calc Af Amer: >60 mL/min (ref >60)
GFR calc non Af Amer: >60 mL/min (ref >60)
GLUCOSE: 90 mg/dL (ref 65–99)
Potassium: 2.8 mmol/L — ABNORMAL LOW (ref 3.5–5.1)
SODIUM: 139 mmol/L (ref 135–145)
Total Bilirubin: 0.9 mg/dL (ref 0.3–1.2)
Total Protein: 7.7 g/dL (ref 6.5–8.1)

## 2015-12-23 LAB — CBC WITH DIFFERENTIAL/PLATELET
BASOS PCT: 0 %
Basophils Absolute: 0 10*3/uL (ref 0.0–0.1)
EOS ABS: 0 10*3/uL (ref 0.0–0.7)
EOS PCT: 1 %
HEMATOCRIT: 32.7 % — AB (ref 36.0–46.0)
HEMOGLOBIN: 11.2 g/dL — AB (ref 12.0–15.0)
LYMPHS PCT: 34 %
Lymphs Abs: 1.3 10*3/uL (ref 0.7–4.0)
MCH: 25.4 pg — AB (ref 26.0–34.0)
MCHC: 34.3 g/dL (ref 30.0–36.0)
MCV: 74.1 fL — AB (ref 78.0–100.0)
MONOS PCT: 8 %
Monocytes Absolute: 0.3 10*3/uL (ref 0.1–1.0)
NEUTROS ABS: 2.3 10*3/uL (ref 1.7–7.7)
Neutrophils Relative %: 57 %
Platelets: 239 10*3/uL (ref 150–400)
RBC: 4.41 MIL/uL (ref 3.87–5.11)
RDW: 13.6 % (ref 11.5–15.5)
WBC: 3.9 10*3/uL — ABNORMAL LOW (ref 4.0–10.5)

## 2015-12-23 LAB — URINALYSIS, ROUTINE W REFLEX MICROSCOPIC
Bilirubin Urine: NEGATIVE
Glucose, UA: NEGATIVE mg/dL
Hgb urine dipstick: NEGATIVE
KETONES UR: NEGATIVE mg/dL
Nitrite: NEGATIVE
PROTEIN: NEGATIVE mg/dL
SQUAMOUS EPITHELIAL / LPF: NONE SEEN
Specific Gravity, Urine: 1.002 — ABNORMAL LOW (ref 1.005–1.030)
pH: 8 (ref 5.0–8.0)

## 2015-12-23 LAB — PREGNANCY, URINE: PREG TEST UR: NEGATIVE

## 2015-12-23 LAB — RAPID URINE DRUG SCREEN, HOSP PERFORMED
Amphetamines: NOT DETECTED
BARBITURATES: NOT DETECTED
Benzodiazepines: POSITIVE — AB
COCAINE: NOT DETECTED
Opiates: NOT DETECTED
Tetrahydrocannabinol: NOT DETECTED

## 2015-12-23 LAB — CORTISOL: CORTISOL PLASMA: 11.2 ug/dL

## 2015-12-23 LAB — LIPASE, BLOOD: Lipase: 21 U/L (ref 11–51)

## 2015-12-23 LAB — MAGNESIUM: Magnesium: 1.9 mg/dL (ref 1.7–2.4)

## 2015-12-23 LAB — POC URINE PREG, ED: Preg Test, Ur: NEGATIVE

## 2015-12-23 LAB — TSH: TSH: 0.366 u[IU]/mL (ref 0.350–4.500)

## 2015-12-23 MED ORDER — COSYNTROPIN 0.25 MG IJ SOLR
0.2500 mg | Freq: Once | INTRAMUSCULAR | Status: AC
  Administered 2015-12-24: 0.25 mg via INTRAVENOUS
  Filled 2015-12-23 (×2): qty 0.25

## 2015-12-23 MED ORDER — DEXTROSE-NACL 5-0.9 % IV SOLN
Freq: Once | INTRAVENOUS | Status: AC
  Administered 2015-12-23: 250 mL via INTRAVENOUS

## 2015-12-23 MED ORDER — PANTOPRAZOLE SODIUM 40 MG IV SOLR
40.0000 mg | Freq: Once | INTRAVENOUS | Status: AC
  Administered 2015-12-23: 40 mg via INTRAVENOUS
  Filled 2015-12-23: qty 40

## 2015-12-23 MED ORDER — IOPAMIDOL (ISOVUE-370) INJECTION 76%
INTRAVENOUS | Status: AC
  Administered 2015-12-23: 80 mL
  Filled 2015-12-23: qty 100

## 2015-12-23 MED ORDER — PANTOPRAZOLE SODIUM 40 MG PO TBEC
80.0000 mg | DELAYED_RELEASE_TABLET | Freq: Every day | ORAL | Status: DC
Start: 2015-12-24 — End: 2015-12-28
  Administered 2015-12-24 – 2015-12-28 (×5): 80 mg via ORAL
  Filled 2015-12-23 (×5): qty 2

## 2015-12-23 MED ORDER — ONDANSETRON HCL 4 MG PO TABS: 4.0000 mg | ORAL_TABLET | Freq: Four times a day (QID) | ORAL | Status: DC | PRN

## 2015-12-23 MED ORDER — ALPRAZOLAM 0.5 MG PO TABS
0.2500 mg | ORAL_TABLET | Freq: Once | ORAL | Status: AC
  Administered 2015-12-23: 0.25 mg via ORAL
  Filled 2015-12-23: qty 1

## 2015-12-23 MED ORDER — ALPRAZOLAM 0.25 MG PO TABS
0.2500 mg | ORAL_TABLET | Freq: Three times a day (TID) | ORAL | Status: DC | PRN
  Administered 2015-12-23 – 2015-12-26 (×7): 0.25 mg via ORAL
  Filled 2015-12-23 (×8): qty 1

## 2015-12-23 MED ORDER — ENOXAPARIN SODIUM 40 MG/0.4ML ~~LOC~~ SOLN: 40.0000 mg | SUBCUTANEOUS | Status: DC

## 2015-12-23 MED ORDER — SODIUM CHLORIDE 0.9 % IV SOLN
30.0000 meq | Freq: Once | INTRAVENOUS | Status: AC
  Administered 2015-12-23: 30 meq via INTRAVENOUS
  Filled 2015-12-23: qty 15

## 2015-12-23 MED ORDER — METOCLOPRAMIDE HCL 5 MG/ML IJ SOLN
10.0000 mg | Freq: Once | INTRAMUSCULAR | Status: AC
  Administered 2015-12-23: 10 mg via INTRAVENOUS
  Filled 2015-12-23: qty 2

## 2015-12-23 MED ORDER — ONDANSETRON HCL 4 MG/2ML IJ SOLN
4.0000 mg | Freq: Four times a day (QID) | INTRAMUSCULAR | Status: DC | PRN
  Filled 2015-12-23: qty 2

## 2015-12-23 MED ORDER — SIMETHICONE 80 MG PO CHEW
160.0000 mg | CHEWABLE_TABLET | Freq: Four times a day (QID) | ORAL | Status: DC | PRN
  Administered 2015-12-23 – 2015-12-28 (×8): 160 mg via ORAL
  Filled 2015-12-23 (×9): qty 2

## 2015-12-23 MED ORDER — ONDANSETRON HCL 4 MG/2ML IJ SOLN: 4.0000 mg | Freq: Three times a day (TID) | INTRAMUSCULAR | Status: DC | PRN

## 2015-12-23 MED ORDER — KCL IN DEXTROSE-NACL 40-5-0.9 MEQ/L-%-% IV SOLN
INTRAVENOUS | Status: DC
  Administered 2015-12-23 – 2015-12-26 (×6): via INTRAVENOUS
  Filled 2015-12-23 (×10): qty 1000

## 2015-12-23 MED ORDER — ENOXAPARIN SODIUM 30 MG/0.3ML ~~LOC~~ SOLN
30.0000 mg | SUBCUTANEOUS | Status: DC
  Administered 2015-12-23 – 2015-12-27 (×5): 30 mg via SUBCUTANEOUS
  Filled 2015-12-23 (×5): qty 0.3

## 2015-12-23 MED ORDER — THIAMINE HCL 100 MG/ML IJ SOLN
100.0000 mg | Freq: Once | INTRAMUSCULAR | Status: AC
  Administered 2015-12-23: 100 mg via INTRAVENOUS
  Filled 2015-12-23: qty 2

## 2015-12-23 MED ORDER — LEVOTHYROXINE SODIUM 100 MCG PO TABS
100.0000 ug | ORAL_TABLET | Freq: Every day | ORAL | Status: DC
  Administered 2015-12-24 – 2015-12-28 (×5): 100 ug via ORAL
  Filled 2015-12-23 (×3): qty 1
  Filled 2015-12-23: qty 2
  Filled 2015-12-23: qty 1

## 2015-12-23 NOTE — ED Triage Notes (Signed)
Pt is here due to nausea and vomiting after eating x greater than one month.  Pt has been evaluated by [facility] and her PCP for this without getting any results.  Pt called EMS as symptoms are getting more severe and she has lost 20lbs in the last month, Pt reports that she used to weigh 100lbs and now weighs 81-82lbs.  Pt tearful.

## 2015-12-23 NOTE — H&P (Signed)
History and Physical    Lauren Bolton ZOX:[id number] DOB: 12-Aug-1960 DOA: 12/23/2015  Referring MD/NP/PA:ER PCP: Julian HySOUTH,STEPHEN ALAN, MD Outpatient Specialists:  Patient coming from: home  Chief Complaint: N/V/weakness  HPI: Lauren Bolton is a 55 y.o. female with medical history significant of hypothyroid, Fe def anemia.  She was in normal health until June 2017 when she went to the ER with weakness--  Appeared anxious and depressed per ER doc.  CT scan of brain was done then that was normal.  She was sent to her PCP for follow up.  She per husband, was able to keep working until August.  At that point she was only able to consume rice water and was very weak.  Husband reports 2 more ER visits in October for nausea and vomiting.  The end of October to November she was hospitalized for N/V and inability to eat.  She had EGD done then with normal esophagus/stomach.    In the ER today, she was found to have a low K and complaints of continued nausea and vomiting.  She reports being very anxious but no abdominal pain.  No pain or difficulty swallowing.  Just anytime patient eats/drinks anything, she vomits.  Patient has been in [place] since 1984.  Previously lived in [place] and [place].  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   Review of Systems: all systems reviewed, negative unless stated above in HPI   Past Medical History:  Diagnosis Date  . Iron deficiency anemia   . Thyroid disease     Past Surgical History:  Procedure Laterality Date  . ESOPHAGOGASTRODUODENOSCOPY N/A 11/13/2015   Procedure: ESOPHAGOGASTRODUODENOSCOPY (EGD);  Surgeon: Dorena CookeyJohn Hayes, MD;  [facility];  Service: Endoscopy;  Laterality: N/A;  . PLACEMENT OF BREAST IMPLANTS     20 years ago  . thyroid goiter       reports that she has never smoked. She has never used smokeless tobacco. She reports that she does not drink alcohol or use drugs.  Allergies  Allergen Reactions  . Benadryl [Diphenhydramine Hcl]  Diarrhea and Other (See Comments)    Reaction:  Makes her excessively sleepy     No GI issues in immediate family  Prior to Admission medications   Medication Sig Start Date End Date Taking? Authorizing Provider  ALPRAZolam (XANAX) 0.25 MG tablet Take 0.25 mg by mouth 3 (three) times daily as needed for anxiety.    Yes Historical Provider, MD  calcium carbonate (TUMS - DOSED IN MG ELEMENTAL CALCIUM) 500 MG chewable tablet Chew 1 tablet by mouth as needed for indigestion or heartburn.   Yes Historical Provider, MD  iron polysaccharides (NIFEREX) 150 MG capsule Take 150 mg by mouth 2 (two) times daily.   Yes Historical Provider, MD  levothyroxine (SYNTHROID) 100 MCG tablet Take 1 tablet (100 mcg total) by mouth daily before breakfast. 11/14/15  Yes Starleen Armsawood S Elgergawy, MD  omeprazole (PRILOSEC) 40 MG capsule Take 40 mg by mouth daily.   Yes Historical Provider, MD  ondansetron (ZOFRAN ODT) 8 MG disintegrating tablet Take 1 tablet (8 mg total) by mouth every 8 (eight) hours as needed. 12/14/15  Yes Zadie Rhineonald Wickline, MD  predniSONE (DELTASONE) 5 MG tablet Take 1 tablet (5 mg total) by mouth daily. Patient not taking: Reported on 12/23/2015 12/01/15   [name], PA-C    Physical Exam: Vitals:   12/23/15 1524 12/23/15 1530 12/23/15 1600 12/23/15 1630  BP: 130/76 131/76 126/73 118/75  Pulse: 66 76 68 67  Resp: 17  Temp:      TempSrc:      SpO2: 100% 100% 100% 100%  Weight:          Constitutional: tearful, moving in bed--poor eye contact Vitals:   12/23/15 1524 12/23/15 1530 12/23/15 1600 12/23/15 1630  BP: 130/76 131/76 126/73 118/75  Pulse: 66 76 68 67  Resp: 17     Temp:      TempSrc:      SpO2: 100% 100% 100% 100%  Weight:       Eyes: PERRL, lids and conjunctivae normal ENMT: Mucous membranes are dry. Posterior pharynx clear of any exudate or lesions.Normal dentition.  Neck: normal, supple, no masses, no thyromegaly Respiratory: clear to auscultation bilaterally, no  wheezing, no crackles. Normal respiratory effort. No accessory muscle use.  Cardiovascular: Regular rate and rhythm, no murmurs / rubs / gallops. No extremity edema. 2+ pedal pulses. No carotid bruits.  Abdomen: tenderness with deep palpations, no masses palpated. No hepatosplenomegaly. Bowel sounds positive.  Musculoskeletal: no clubbing / cyanosis. No joint deformity upper and lower extremities. Good ROM, no contractures. Normal muscle tone.  Skin: no rashes, lesions, ulcers. No induration Neurologic: CN 2-12 grossly intact. Sensation intact, DTR normal. Strength 5/5 in all 4.  Psychiatric: poor eye contact    Labs on Admission: I have personally reviewed following labs and imaging studies  CBC:  Recent Labs Lab 12/23/15 1255  WBC 3.9*  NEUTROABS 2.3  HGB 11.2*  HCT 32.7*  MCV 74.1*  PLT 239   Basic Metabolic Panel:  Recent Labs Lab 12/23/15 1255  NA 139  K 2.8*  CL 101  CO2 28  GLUCOSE 90  BUN <5*  CREATININE 0.82  CALCIUM 9.3   GFR: Estimated Creatinine Clearance: 44.9 mL/min (by C-G formula based on SCr of 0.82 mg/dL). Liver Function Tests:  Recent Labs Lab 12/23/15 1255  AST 36  ALT 35  ALKPHOS 58  BILITOT 0.9  PROT 7.7  ALBUMIN 4.2    Recent Labs Lab 12/23/15 1255  LIPASE 21   No results for input(s): AMMONIA in the last 168 hours. Coagulation Profile: No results for input(s): INR, PROTIME in the last 168 hours. Cardiac Enzymes: No results for input(s): CKTOTAL, CKMB, CKMBINDEX, TROPONINI in the last 168 hours. BNP (last 3 results) No results for input(s): PROBNP in the last 8760 hours. HbA1C: No results for input(s): HGBA1C in the last 72 hours. CBG: No results for input(s): GLUCAP in the last 168 hours. Lipid Profile: No results for input(s): CHOL, HDL, LDLCALC, TRIG, CHOLHDL, LDLDIRECT in the last 72 hours. Thyroid Function Tests: No results for input(s): TSH, T4TOTAL, FREET4, T3FREE, THYROIDAB in the last 72 hours. Anemia Panel: No  results for input(s): VITAMINB12, FOLATE, FERRITIN, TIBC, IRON, RETICCTPCT in the last 72 hours. Urine analysis:    Component Value Date/Time   COLORURINE COLORLESS (A) 12/23/2015 1249   APPEARANCEUR CLEAR 12/23/2015 1249   LABSPEC 1.002 (L) 12/23/2015 1249   PHURINE 8.0 12/23/2015 1249   GLUCOSEU NEGATIVE 12/23/2015 1249   HGBUR NEGATIVE 12/23/2015 1249   BILIRUBINUR NEGATIVE 12/23/2015 1249   KETONESUR NEGATIVE 12/23/2015 1249   PROTEINUR NEGATIVE 12/23/2015 1249   UROBILINOGEN 0.2 03/17/2010 1421   NITRITE NEGATIVE 12/23/2015 1249   LEUKOCYTESUR TRACE (A) 12/23/2015 1249   Sepsis Labs: Invalid input(s): PROCALCITONIN, LACTICIDVEN No results found for this or any previous visit (from the past 240 hour(s)).   Radiological Exams on Admission: Dg Abdomen Acute W/chest  Result Date: 12/23/2015 CLINICAL DATA:  Nausea vomiting.  EXAM: DG ABDOMEN ACUTE W/ 1V CHEST COMPARISON:  06/20/2015. FINDINGS: Surgical clips in the neck. Mediastinum and hilar structures normal. Lungs are clear of acute infiltrates. Knee bilateral nodular opacities noted over both lung bases consistent prominent nipple shadows. Soft tissue structures of the abdomen unremarkable. No bowel distention. No free air. Tiny sclerotic focus in the right iliac wing is unchanged most consistent 30 small bone island. No acute bony abnormality . IMPRESSION: Negative abdominal radiographs.  No acute cardiopulmonary disease. Electronically Signed   By: Maisie Fus  Register   On: 12/23/2015 13:28   Ct Angio Abd/pel W And/or Wo Contrast  Result Date: 12/23/2015 CLINICAL DATA:  Epigastric pain and diffuse abdominal tenderness with nausea and vomiting for the last 2 months. Pain has become severe today. EXAM: CTA ABDOMEN AND PELVIS WITH CONTRAST TECHNIQUE: Multidetector CT imaging of the abdomen and pelvis was performed using the standard protocol during bolus administration of intravenous contrast. Multiplanar reconstructed images and MIPs  were obtained and reviewed to evaluate the vascular anatomy. CONTRAST:  80 mL Isovue 370 IV COMPARISON:  11/11/2015 FINDINGS: VASCULAR Aorta: Minimal atheromatous plaque. No aneurysm, dissection, or stenosis. Celiac: Patent SMA: Patent Renals: Both renal arteries are patent without evidence of aneurysm, dissection, vasculitis, fibromuscular dysplasia or significant stenosis. IMA: Patent without evidence of aneurysm, dissection, vasculitis or significant stenosis. Inflow: Patent without evidence of aneurysm, dissection, vasculitis or significant stenosis. Proximal Outflow: Bilateral common femoral and visualized portions of the superficial and profunda femoral arteries are patent without evidence of aneurysm, dissection, vasculitis or significant stenosis. Veins: Early reflux down a dilated left gonadal vein. Review of the MIP images confirms the above findings. NON-VASCULAR Lower chest: Lung bases clear. No pleural or pericardial effusion. Bilateral breast implants partially visualized. Hepatobiliary: No focal liver abnormality is seen. No gallstones, gallbladder wall thickening, or biliary dilatation. Pancreas: Unremarkable. No pancreatic ductal dilatation or surrounding inflammatory changes. Spleen: Normal in size without focal abnormality. Adrenals/Urinary Tract: Adrenal glands are unremarkable. Kidneys are normal, without renal calculi, focal lesion, or hydronephrosis. Bladder is physiologically distended. Stomach/Bowel: Stomach is within normal limits. Appendix appears normal. No evidence of bowel wall thickening, distention, or inflammatory changes. Lymphatic: No adenopathy localized. Reproductive: Dilated refluxing left gonadal vein. Uterus and bilateral adnexal regions unremarkable. Other: No ascites.  No free air. Musculoskeletal: No acute or significant osseous findings. IMPRESSION: VASCULAR 1. No acute vascular findings. 2.  Aortic Atherosclerosis (ICD10-170.0) 3. Refluxing dilated left gonadal vein.  Correlate with any clinical evidence of pelvic congestion syndrome. NON-VASCULAR 1. No acute findings Electronically Signed   By: Corlis Leak M.D.   On: 12/23/2015 15:03      Assessment/Plan Active Problems:   Intractable vomiting  weight loss anxiety Hypokalemia  Hypokalemia -replace IV as patient can not tolerate PO -check Mg  N/V intractable -has had EGD -multiple ER visits and hospitalization since June of this year when symptoms started -zofran -GI consult Gastric emptying ordered for am stim test to r/o adrenal insuff-- was on steroids at home once, has skin pigment changes on face -doubt AIP -oddly albumin is normal despite claims of not eating/drinking -CTA negative -HIV -RUQ and then HIDA scan with EF -random porphyins  Anxiety -? Psych issues causing N/V -continue xanax  Weight loss     DVT prophylaxis: lovenox Code Status: full Family Communication: patient Disposition Plan:  Consults called: GI Admission status: obs   Celest Reitz U Fatih Stalvey DO [facility] Pager [phone]  If 7PM-7AM, please contact night-coverage [email or web address] Password [email or web address]  12/23/2015, 5:15  PM

## 2015-12-23 NOTE — ED Notes (Signed)
Attempted report x1. 

## 2015-12-23 NOTE — ED Notes (Signed)
GI at bedside

## 2015-12-23 NOTE — ED Notes (Signed)
Pt ambulated to RR1. 

## 2015-12-23 NOTE — Consult Note (Signed)
Referring Provider:   Marlin CanaryJessica Vann, DO Primary Care Physician:  Julian HySOUTH,STEPHEN ALAN, MD Primary Gastroenterologist:  Dr. Doy MinceVince Schooler   Reason for Consultation:  Intractable nausea and vomiting, weight loss  HPI: Lauren Bolton is a 55 y.o. female been admitted through the emergency room today because of hypokalemia and food intolerance.  It sounds as though the patient's symptoms have been problematic for approximately the past 6 months, in a progressive fashion, initially with just decreased food intake, then more selective food intake, and most recently, unable to consume any solid food, basically just rice and water.   Although the patient is from [place], her English is sufficient to describe her symptoms. She indicates that she is not hungry, and that when she does try to eat, she immediately has to vomit the food back up. During this period of time, she has lost about 20 pounds, from a baseline of around 95 or 98 pounds to a current weight of 79 pounds.  Even while not eating, the patient is symptomatic with nausea. However, she does not report any abdominal pain.  The patient was evaluated by our service back in early November, approximately 5 weeks ago, at which time an abdominal pelvic CT scan was unrevealing, and upper endoscopy was normal, including a negative CLOtest for Helicobacter pylori infection. On this admission, the patient was hypokalemic but a repeat CT scan (this time, CT angiography) again showed no significant pathology, just a dilated left gonadal vein, and specifically, no evidence of vascular obstruction.  In the remote past, the patient had endoscopy and colonoscopy by Dr. Wandra MannanPete Santogade in December 2000 for evaluation of epigastric pain and rectal bleeding, at which time she was CLOtest positive (was CLO negative when checked last month) but the colonoscopy was entirely normal.   Past Medical History:  Diagnosis Date  . Iron deficiency anemia   . Thyroid disease      Past Surgical History:  Procedure Laterality Date  . ESOPHAGOGASTRODUODENOSCOPY N/A 11/13/2015   Procedure: ESOPHAGOGASTRODUODENOSCOPY (EGD);  Surgeon: Dorena CookeyJohn Hayes, MD;  [facility];  Service: Endoscopy;  Laterality: N/A;  . PLACEMENT OF BREAST IMPLANTS     20 years ago  . thyroid goiter      Prior to Admission medications   Medication Sig Start Date End Date Taking? Authorizing Provider  ALPRAZolam (XANAX) 0.25 MG tablet Take 0.25 mg by mouth 3 (three) times daily as needed for anxiety.    Yes Historical Provider, MD  calcium carbonate (TUMS - DOSED IN MG ELEMENTAL CALCIUM) 500 MG chewable tablet Chew 1 tablet by mouth as needed for indigestion or heartburn.   Yes Historical Provider, MD  iron polysaccharides (NIFEREX) 150 MG capsule Take 150 mg by mouth 2 (two) times daily.   Yes Historical Provider, MD  levothyroxine (SYNTHROID) 100 MCG tablet Take 1 tablet (100 mcg total) by mouth daily before breakfast. 11/14/15  Yes Starleen Armsawood S Elgergawy, MD  omeprazole (PRILOSEC) 40 MG capsule Take 40 mg by mouth daily.   Yes Historical Provider, MD  ondansetron (ZOFRAN ODT) 8 MG disintegrating tablet Take 1 tablet (8 mg total) by mouth every 8 (eight) hours as needed. 12/14/15  Yes Zadie Rhineonald Wickline, MD  predniSONE (DELTASONE) 5 MG tablet Take 1 tablet (5 mg total) by mouth daily. Patient not taking: Reported on 12/23/2015 12/01/15   [name], PA-C    Current Facility-Administered Medications  Medication Dose Route Frequency Provider Last Rate Last Dose  . [START ON 12/24/2015] cosyntropin (CORTROSYN) injection 0.25 mg  0.25 mg Intravenous Once Jessica U Vann, DO      . dextrose 5 % and 0.9 % NaCl with KCl 40 mEq/L infusion   Intravenous Continuous Trixie Dredge, PA-C 125 mL/hr at 12/23/15 1547     Current Outpatient Prescriptions  Medication Sig Dispense Refill  . ALPRAZolam (XANAX) 0.25 MG tablet Take 0.25 mg by mouth 3 (three) times daily as needed for anxiety.   0  . calcium  carbonate (TUMS - DOSED IN MG ELEMENTAL CALCIUM) 500 MG chewable tablet Chew 1 tablet by mouth as needed for indigestion or heartburn.    . iron polysaccharides (NIFEREX) 150 MG capsule Take 150 mg by mouth 2 (two) times daily.    Marland Kitchen levothyroxine (SYNTHROID) 100 MCG tablet Take 1 tablet (100 mcg total) by mouth daily before breakfast.    . omeprazole (PRILOSEC) 40 MG capsule Take 40 mg by mouth daily.  0  . ondansetron (ZOFRAN ODT) 8 MG disintegrating tablet Take 1 tablet (8 mg total) by mouth every 8 (eight) hours as needed. 10 tablet 0  . predniSONE (DELTASONE) 5 MG tablet Take 1 tablet (5 mg total) by mouth daily. (Patient not taking: Reported on 12/23/2015) 5 tablet 0    Allergies as of 12/23/2015 - Review Complete 12/23/2015  Allergen Reaction Noted  . Benadryl [diphenhydramine hcl] Diarrhea and Other (See Comments) 11/13/2015    No family history on file.  Social History   Social History  . Marital status: Married    Spouse name: N/A  . Number of children: N/A  . Years of education: N/A   Occupational History  . Not on file.   Social History Main Topics  . Smoking status: Never Smoker  . Smokeless tobacco: Never Used  . Alcohol use No  . Drug use: No  . Sexual activity: Not on file   Other Topics Concern  . Not on file   Social History Narrative  . No narrative on file    Review of Systems: Stools are small and scybbalous in character. No chest pain, no dyspnea, no cough, no skin problems, no joint effusions. She is ambulatory.  Physical Exam: Vital signs in last 24 hours: Temp:  [98.3 F (36.8 C)] 98.3 F (36.8 C) (12/18 1149) Pulse Rate:  [66-76] 72 (12/18 1817) Resp:  [16-18] 18 (12/18 1817) BP: (109-154)/(56-84) 109/72 (12/18 1817) SpO2:  [99 %-100 %] 100 % (12/18 1817) Weight:  [36.7 kg (81 lb)] 36.7 kg (81 lb) (12/18 1152)   General:   The patient is lying in bed in no distress. She does appear somewhat anxious and withdrawn Head:  Slightly cushingoid  and atraumatic. Eyes:  Sclera clear, no icterus.   Conjunctiva pink. Mouth:   No ulcerations or lesions.  Oropharynx pink & moist. Neck:   No masses, questionable mild thyromegaly. Lungs:  Clear throughout to auscultation.   No wheezes, crackles, or rhonchi. No evident respiratory distress. Heart:   Regular rate and rhythm; no murmurs, clicks, rubs,  or gallops. Abdomen:  Soft, nontender, nontympanitic, and nondistended. No masses, hepatosplenomegaly or ventral hernias noted, no guarding or rebound.  No succussion splash. Msk:   Symmetrical without gross deformities. Pulses:  Normal radial pulse is noted. Extremities:   Without clubbing, cyanosis, or edema. Neurologic:  Alert and coherent;  grossly normal neurologically. Skin:   questionable facial hyperpigmentation raising question of Cushing's disease Cervical Nodes:  No significant cervical adenopathy. Psych:   Alert  but rather quiet and withdrawn  Intake/Output from previous day:  No intake/output data recorded. Intake/Output this shift: Total I/O In: 1000 [I.V.:1000] Out: -   Lab Results:  Recent Labs  12/23/15 1255  WBC 3.9*  HGB 11.2*  HCT 32.7*  PLT 239   BMET  Recent Labs  12/23/15 1255  NA 139  K 2.8*  CL 101  CO2 28  GLUCOSE 90  BUN <5*  CREATININE 0.82  CALCIUM 9.3   LFT  Recent Labs  12/23/15 1255  PROT 7.7  ALBUMIN 4.2  AST 36  ALT 35  ALKPHOS 58  BILITOT 0.9   PT/INR No results for input(s): LABPROT, INR in the last 72 hours.  Studies/Results: Dg Abdomen Acute W/chest  Result Date: 12/23/2015 CLINICAL DATA:  Nausea vomiting. EXAM: DG ABDOMEN ACUTE W/ 1V CHEST COMPARISON:  06/20/2015. FINDINGS: Surgical clips in the neck. Mediastinum and hilar structures normal. Lungs are clear of acute infiltrates. Knee bilateral nodular opacities noted over both lung bases consistent prominent nipple shadows. Soft tissue structures of the abdomen unremarkable. No bowel distention. No free air. Tiny  sclerotic focus in the right iliac wing is unchanged most consistent 30 small bone island. No acute bony abnormality . IMPRESSION: Negative abdominal radiographs.  No acute cardiopulmonary disease. Electronically Signed   By: Maisie Fushomas  Register   On: 12/23/2015 13:28   Ct Angio Abd/pel W And/or Wo Contrast  Result Date: 12/23/2015 CLINICAL DATA:  Epigastric pain and diffuse abdominal tenderness with nausea and vomiting for the last 2 months. Pain has become severe today. EXAM: CTA ABDOMEN AND PELVIS WITH CONTRAST TECHNIQUE: Multidetector CT imaging of the abdomen and pelvis was performed using the standard protocol during bolus administration of intravenous contrast. Multiplanar reconstructed images and MIPs were obtained and reviewed to evaluate the vascular anatomy. CONTRAST:  80 mL Isovue 370 IV COMPARISON:  11/11/2015 FINDINGS: VASCULAR Aorta: Minimal atheromatous plaque. No aneurysm, dissection, or stenosis. Celiac: Patent SMA: Patent Renals: Both renal arteries are patent without evidence of aneurysm, dissection, vasculitis, fibromuscular dysplasia or significant stenosis. IMA: Patent without evidence of aneurysm, dissection, vasculitis or significant stenosis. Inflow: Patent without evidence of aneurysm, dissection, vasculitis or significant stenosis. Proximal Outflow: Bilateral common femoral and visualized portions of the superficial and profunda femoral arteries are patent without evidence of aneurysm, dissection, vasculitis or significant stenosis. Veins: Early reflux down a dilated left gonadal vein. Review of the MIP images confirms the above findings. NON-VASCULAR Lower chest: Lung bases clear. No pleural or pericardial effusion. Bilateral breast implants partially visualized. Hepatobiliary: No focal liver abnormality is seen. No gallstones, gallbladder wall thickening, or biliary dilatation. Pancreas: Unremarkable. No pancreatic ductal dilatation or surrounding inflammatory changes. Spleen: Normal  in size without focal abnormality. Adrenals/Urinary Tract: Adrenal glands are unremarkable. Kidneys are normal, without renal calculi, focal lesion, or hydronephrosis. Bladder is physiologically distended. Stomach/Bowel: Stomach is within normal limits. Appendix appears normal. No evidence of bowel wall thickening, distention, or inflammatory changes. Lymphatic: No adenopathy localized. Reproductive: Dilated refluxing left gonadal vein. Uterus and bilateral adnexal regions unremarkable. Other: No ascites.  No free air. Musculoskeletal: No acute or significant osseous findings. IMPRESSION: VASCULAR 1. No acute vascular findings. 2.  Aortic Atherosclerosis (ICD10-170.0) 3. Refluxing dilated left gonadal vein. Correlate with any clinical evidence of pelvic congestion syndrome. NON-VASCULAR 1. No acute findings Electronically Signed   By: Corlis Leak  Hassell M.D.   On: 12/23/2015 15:03    Impression: 1. Basaldua-standing, relentless, progressive symptoms of nausea, vomiting/regurgitation with dry heaves, and weight loss  Discussion: I am very unclear as to the source  of this patient's symptoms. Possibilities which come to mind include depression or somatization or other psychophysiologic conditions including possible factitious disorder; esophagogastric dysmotility; some sort of small bowel infiltrative disease (for example, amyloidosis); or atypical biliary tract disease.  Plan: 1. Check a.m. cortisol level--agree with plan for cosyntropin stimulation test to check for Addison's 2. Await results of ultrasound 3. Gastric emptying scan 4. Upper GI series, with particular reference to esophageal and gastric motility 5. Consider repeat endoscopy with random mucosal biopsies of the small intestine 6. Stool for occult blood 7. Consider colonoscopy since it has been 17 years since her last exam, and she is over age 13. However, it is unclear whether she would be able to tolerate the prep. Perhaps at least a limited study  could be performed after administering enemas.   LOS: 0 days   Makinzee Durley V  12/23/2015, 6:36 PM   Pager [phone] If no answer or after 5 PM call [phone]

## 2015-12-23 NOTE — Progress Notes (Signed)
(  PRELIMINARY NOTE)  We have been asked to see this patient for intractable n/v.; we will plan to see her tomorrow morning.  In the meantime, if it is possible to get her on tomorrow for a gastric emptying scan (as her PCP had apparently already scheduled for 01/02/16), that would be ideal.  At the moment, I am not inclined to repeat her egd, but we may need to consider that given the intractable character of her sx.  Florencia Reasonsobert V. Maitlyn Penza, M.D. Pager [phone] If no answer or after 5 PM call [phone]

## 2015-12-23 NOTE — ED Provider Notes (Signed)
[facility]-EMERGENCY DEPT Provider Note   CSN: [id number] Arrival date & time: 12/23/15  1146     History   Chief Complaint Chief Complaint  Patient presents with  . Nausea    HPI Lauren Bolton is a 55 y.o. female.  HPI   Patient with hx hypothyroidism on synthroid, iron deficiency anemia with 4 months of epigastric burning with N/V, pain begins with eating or drinking, occasionally radiates into her chest.  Has been seen in ED multiple times and by GI, PCP.  Has had negative EGD (11/13/15), CT abd/pelvis (11/11/15).  She has become weaker and weaker and has lost more and more weight, totally 15-20 pounds.  She currently weighs 79 lbs.  Has tried to take gas-x, omeprazole, stool softener but these mostly come back up.  Last BM was yesterday and was dark.  Denies urinary or vaginal complaints.  Does have dry cough at night.    Has gastric emptying study 01/02/16  Past Medical History:  Diagnosis Date  . Iron deficiency anemia   . Thyroid disease     Patient Active Problem List   Diagnosis Date Noted  . Intractable vomiting 12/23/2015  . Hypokalemia 11/11/2015  . Iron deficiency anemia 11/11/2015  . Malaise and fatigue 11/11/2015  . Failure to thrive in adult 11/11/2015  . Protein-calorie malnutrition, severe 11/11/2015  . Nausea with vomiting 11/11/2015  . Anxiety state   . Decreased oral intake   . Hypothyroidism 11/10/2015    Past Surgical History:  Procedure Laterality Date  . ESOPHAGOGASTRODUODENOSCOPY N/A 11/13/2015   Procedure: ESOPHAGOGASTRODUODENOSCOPY (EGD);  Surgeon: Dorena Cookey, MD;  Location: [facility];  Service: Endoscopy;  Laterality: N/A;  . PLACEMENT OF BREAST IMPLANTS     20 years ago  . thyroid goiter      OB History    No data available       Home Medications    Prior to Admission medications   Medication Sig Start Date End Date Taking? Authorizing Provider  ALPRAZolam (XANAX) 0.25 MG tablet Take 0.25 mg by mouth 3 (three) times daily as  needed for anxiety.    Yes Historical Provider, MD  calcium carbonate (TUMS - DOSED IN MG ELEMENTAL CALCIUM) 500 MG chewable tablet Chew 1 tablet by mouth as needed for indigestion or heartburn.   Yes Historical Provider, MD  iron polysaccharides (NIFEREX) 150 MG capsule Take 150 mg by mouth 2 (two) times daily.   Yes Historical Provider, MD  levothyroxine (SYNTHROID) 100 MCG tablet Take 1 tablet (100 mcg total) by mouth daily before breakfast. 11/14/15  Yes Starleen Arms, MD  omeprazole (PRILOSEC) 40 MG capsule Take 40 mg by mouth daily.   Yes Historical Provider, MD  ondansetron (ZOFRAN ODT) 8 MG disintegrating tablet Take 1 tablet (8 mg total) by mouth every 8 (eight) hours as needed. 12/14/15  Yes Zadie Rhine, MD  predniSONE (DELTASONE) 5 MG tablet Take 1 tablet (5 mg total) by mouth daily. Patient not taking: Reported on 12/23/2015 12/01/15   Bethel Born, PA-C    Family History No family history on file.  Social History Social History  Substance Use Topics  . Smoking status: Never Smoker  . Smokeless tobacco: Never Used  . Alcohol use No     Allergies   Benadryl [diphenhydramine hcl]   Review of Systems Review of Systems  All other systems reviewed and are negative.    Physical Exam Updated Vital Signs BP 118/75   Pulse 67   Temp 98.3 F (  36.8 C) (Oral)   Resp 17   Wt 36.7 kg   SpO2 100%   BMI 14.82 kg/m   Physical Exam  Constitutional: No distress.  Thin, frail.  Tearful.   HENT:  Head: Normocephalic and atraumatic.  Neck: Neck supple.  Cardiovascular: Normal rate and regular rhythm.   Pulmonary/Chest: Effort normal and breath sounds normal. No respiratory distress. She has no wheezes. She has no rales.  Abdominal: Soft. She exhibits no distension. There is tenderness in the right lower quadrant, suprapubic area and left lower quadrant. There is no rebound and no guarding.  Neurological: She is alert.  Skin: She is not diaphoretic.  Nursing  note and vitals reviewed.    ED Treatments / Results  Labs (all labs ordered are listed, but only abnormal results are displayed) Labs Reviewed  COMPREHENSIVE METABOLIC PANEL - Abnormal; Notable for the following:       Result Value   Potassium 2.8 (*)    BUN <5 (*)    All other components within normal limits  CBC WITH DIFFERENTIAL/PLATELET - Abnormal; Notable for the following:    WBC 3.9 (*)    Hemoglobin 11.2 (*)    HCT 32.7 (*)    MCV 74.1 (*)    MCH 25.4 (*)    All other components within normal limits  URINALYSIS, ROUTINE W REFLEX MICROSCOPIC - Abnormal; Notable for the following:    Color, Urine COLORLESS (*)    Specific Gravity, Urine 1.002 (*)    Leukocytes, UA TRACE (*)    Bacteria, UA MANY (*)    All other components within normal limits  LIPASE, BLOOD  PREGNANCY, URINE  TSH  MAGNESIUM  CORTISOL  ACTH STIMULATION, 3 TIME POINTS  HIV ANTIBODY (ROUTINE TESTING)  POC URINE PREG, ED  POC OCCULT BLOOD, ED    EKG  EKG Interpretation None       Radiology Dg Abdomen Acute W/chest  Result Date: 12/23/2015 CLINICAL DATA:  Nausea vomiting. EXAM: DG ABDOMEN ACUTE W/ 1V CHEST COMPARISON:  06/20/2015. FINDINGS: Surgical clips in the neck. Mediastinum and hilar structures normal. Lungs are clear of acute infiltrates. Knee bilateral nodular opacities noted over both lung bases consistent prominent nipple shadows. Soft tissue structures of the abdomen unremarkable. No bowel distention. No free air. Tiny sclerotic focus in the right iliac wing is unchanged most consistent 30 small bone island. No acute bony abnormality . IMPRESSION: Negative abdominal radiographs.  No acute cardiopulmonary disease. Electronically Signed   By: Maisie Fushomas  Register   On: 12/23/2015 13:28   Ct Angio Abd/pel W And/or Wo Contrast  Result Date: 12/23/2015 CLINICAL DATA:  Epigastric pain and diffuse abdominal tenderness with nausea and vomiting for the last 2 months. Pain has become severe today.  EXAM: CTA ABDOMEN AND PELVIS WITH CONTRAST TECHNIQUE: Multidetector CT imaging of the abdomen and pelvis was performed using the standard protocol during bolus administration of intravenous contrast. Multiplanar reconstructed images and MIPs were obtained and reviewed to evaluate the vascular anatomy. CONTRAST:  80 mL Isovue 370 IV COMPARISON:  11/11/2015 FINDINGS: VASCULAR Aorta: Minimal atheromatous plaque. No aneurysm, dissection, or stenosis. Celiac: Patent SMA: Patent Renals: Both renal arteries are patent without evidence of aneurysm, dissection, vasculitis, fibromuscular dysplasia or significant stenosis. IMA: Patent without evidence of aneurysm, dissection, vasculitis or significant stenosis. Inflow: Patent without evidence of aneurysm, dissection, vasculitis or significant stenosis. Proximal Outflow: Bilateral common femoral and visualized portions of the superficial and profunda femoral arteries are patent without evidence of aneurysm, dissection,  vasculitis or significant stenosis. Veins: Early reflux down a dilated left gonadal vein. Review of the MIP images confirms the above findings. NON-VASCULAR Lower chest: Lung bases clear. No pleural or pericardial effusion. Bilateral breast implants partially visualized. Hepatobiliary: No focal liver abnormality is seen. No gallstones, gallbladder wall thickening, or biliary dilatation. Pancreas: Unremarkable. No pancreatic ductal dilatation or surrounding inflammatory changes. Spleen: Normal in size without focal abnormality. Adrenals/Urinary Tract: Adrenal glands are unremarkable. Kidneys are normal, without renal calculi, focal lesion, or hydronephrosis. Bladder is physiologically distended. Stomach/Bowel: Stomach is within normal limits. Appendix appears normal. No evidence of bowel wall thickening, distention, or inflammatory changes. Lymphatic: No adenopathy localized. Reproductive: Dilated refluxing left gonadal vein. Uterus and bilateral adnexal regions  unremarkable. Other: No ascites.  No free air. Musculoskeletal: No acute or significant osseous findings. IMPRESSION: VASCULAR 1. No acute vascular findings. 2.  Aortic Atherosclerosis (ICD10-170.0) 3. Refluxing dilated left gonadal vein. Correlate with any clinical evidence of pelvic congestion syndrome. NON-VASCULAR 1. No acute findings Electronically Signed   By: Corlis Leak  Hassell M.D.   On: 12/23/2015 15:03    Procedures Procedures (including critical care time)  Medications Ordered in ED Medications  dextrose 5 % and 0.9 % NaCl with KCl 40 mEq/L infusion ( Intravenous New Bag/Given 12/23/15 1547)  cosyntropin (CORTROSYN) injection 0.25 mg (not administered)  thiamine (B-1) injection 100 mg (100 mg Intravenous Given 12/23/15 1300)  dextrose 5 %-0.9 % sodium chloride infusion ( Intravenous Stopped 12/23/15 1413)  pantoprazole (PROTONIX) injection 40 mg (40 mg Intravenous Given 12/23/15 1413)  metoCLOPramide (REGLAN) injection 10 mg (10 mg Intravenous Given 12/23/15 1412)  iopamidol (ISOVUE-370) 76 % injection (80 mLs  Contrast Given 12/23/15 1436)  ALPRAZolam (XANAX) tablet 0.25 mg (0.25 mg Oral Given 12/23/15 1556)     Initial Impression / Assessment and Plan / ED Course  I have reviewed the triage vital signs and the nursing notes.  Pertinent labs & imaging results that were available during my care of the patient were reviewed by me and considered in my medical decision making (see chart for details).  Clinical Course as of Dec 22 1725  Mon Dec 23, 2015  1702 I spoke with [facility]ccini[facility] who states he will put a note in patient's chart and does recommend a gastric emptying study.    [EW]    Clinical Course User Index [EW] Trixie DredgeEmily Shekita Boyden, PA-C    Pt with several months of progressive symptoms of epigastric pain, N/V, not tolerating PO, weight loss.  Pt now 79 pounds.  Found to be hypokalemic.  I suspect she is malnourished.  Pt only able to tolerate water and very little  food.  Admitted to [facility].  I consulted [facility], Dr Matthias HughsBuccini, for recommendations (see above).   Final Clinical Impressions(s) / ED Diagnoses   Final diagnoses:  Unknown cause of injury  Intractable vomiting with nausea, unspecified vomiting type  Hypokalemia  Malnutrition, unspecified type [facility](HCC)    New Prescriptions New Prescriptions   No medications on file     Trixie Dredgemily Monee Dembeck, PA-C 12/23/15 1727    Shaune Pollackameron Isaacs, MD 12/25/15 2056

## 2015-12-24 ENCOUNTER — Observation Stay (HOSPITAL_COMMUNITY): Payer: BLUE CROSS/BLUE SHIELD

## 2015-12-24 DIAGNOSIS — E46 Unspecified protein-calorie malnutrition: Secondary | ICD-10-CM

## 2015-12-24 DIAGNOSIS — E876 Hypokalemia: Secondary | ICD-10-CM | POA: Diagnosis not present

## 2015-12-24 DIAGNOSIS — R112 Nausea with vomiting, unspecified: Secondary | ICD-10-CM | POA: Diagnosis not present

## 2015-12-24 DIAGNOSIS — R1110 Vomiting, unspecified: Secondary | ICD-10-CM | POA: Diagnosis not present

## 2015-12-24 DIAGNOSIS — R633 Feeding difficulties: Secondary | ICD-10-CM | POA: Diagnosis not present

## 2015-12-24 DIAGNOSIS — R634 Abnormal weight loss: Secondary | ICD-10-CM | POA: Diagnosis not present

## 2015-12-24 LAB — CORTISOL-AM, BLOOD: Cortisol - AM: 27.3 ug/dL — ABNORMAL HIGH (ref 6.7–22.6)

## 2015-12-24 LAB — CBC
HCT: 28.6 % — ABNORMAL LOW (ref 36.0–46.0)
HEMOGLOBIN: 9.4 g/dL — AB (ref 12.0–15.0)
MCH: 24.5 pg — AB (ref 26.0–34.0)
MCHC: 32.9 g/dL (ref 30.0–36.0)
MCV: 74.7 fL — AB (ref 78.0–100.0)
Platelets: 198 10*3/uL (ref 150–400)
RBC: 3.83 MIL/uL — AB (ref 3.87–5.11)
RDW: 13.9 % (ref 11.5–15.5)
WBC: 3.5 10*3/uL — ABNORMAL LOW (ref 4.0–10.5)

## 2015-12-24 LAB — ACTH STIMULATION, 3 TIME POINTS
Cortisol, 30 Min: 22.1 ug/dL
Cortisol, 60 Min: 27.3 ug/dL
Cortisol, Base: 14.2 ug/dL

## 2015-12-24 LAB — BASIC METABOLIC PANEL
ANION GAP: 5 (ref 5–15)
BUN: <5 mg/dL — ABNORMAL LOW (ref 6–20)
CHLORIDE: 111 mmol/L (ref 101–111)
CO2: 23 mmol/L (ref 22–32)
Calcium: 8.8 mg/dL — ABNORMAL LOW (ref 8.9–10.3)
Creatinine, Ser: 0.72 mg/dL (ref 0.44–1.00)
GFR calc non Af Amer: >60 mL/min (ref >60)
Glucose, Bld: 102 mg/dL — ABNORMAL HIGH (ref 65–99)
Potassium: 4.4 mmol/L (ref 3.5–5.1)
Sodium: 139 mmol/L (ref 135–145)

## 2015-12-24 LAB — PREALBUMIN: Prealbumin: 16 mg/dL — ABNORMAL LOW (ref 18–38)

## 2015-12-24 LAB — HIV ANTIBODY (ROUTINE TESTING W REFLEX): HIV Screen 4th Generation wRfx: NONREACTIVE

## 2015-12-24 MED ORDER — TECHNETIUM TC 99M SULFUR COLLOID
2.0000 | Freq: Once | INTRAVENOUS | Status: AC | PRN
  Administered 2015-12-24: 2 via ORAL

## 2015-12-24 MED ORDER — PRO-STAT SUGAR FREE PO LIQD
30.0000 mL | Freq: Three times a day (TID) | ORAL | Status: DC
  Administered 2015-12-24 – 2015-12-28 (×10): 30 mL via ORAL
  Filled 2015-12-24 (×11): qty 30

## 2015-12-24 NOTE — Progress Notes (Signed)
Initial Nutrition Assessment  DOCUMENTATION CODES:   Severe malnutrition in context of acute illness/injury, Underweight  INTERVENTION:   -30 ml Prostat TID, each supplement provides 100 kcals and 15 grams protein -MVI daily  NUTRITION DIAGNOSIS:   Malnutrition related to acute illness as evidenced by percent weight loss, energy intake < or equal to 75% for > or equal to 1 month, moderate depletions of muscle mass.  GOAL:   Patient will meet greater than or equal to 90% of their needs  MONITOR:   PO intake, Supplement acceptance, Labs, Weight trends, Skin, I & O's  REASON FOR ASSESSMENT:   Malnutrition Screening Tool    ASSESSMENT:   Lauren Bolton is a 55 y.o. female with medical history significant of hypothyroid, Fe def anemia.  She was in normal health until June 2017 when she went to the ER with weakness--  Appeared anxious and depressed per ER doc.  CT scan of brain was done then that was normal.  She was sent to her PCP for follow up.  She per husband, was able to keep working until August.  At that point she was only able to consume rice water and was very weak.  Husband reports 2 more ER visits in October for nausea and vomiting.  The end of October to November she was hospitalized for N/V and inability to eat.  She had EGD done then with normal esophagus/stomach.    Pt admitted with intractable nausea and vomiting.   Pt in with MD at time of visit; unable to obtain further hx or complete nutrition-focused physical exam.   [facility]; pt with hx of severe malnutrition in the context of acute illness, which is ongoing. Exam performed on 11/11/15 revealed moderate muscle depletion in clavicle, temporal, and dorsal hand regions. Pt with hx of progressively poor oral intake- starting with decreased food intake to becoming more selective with foods- pt now consumes mainly rice and water.   Per GI notes, UBW is around 95#. Pt has experienced a 17.5%  wt loss over the past month, which is significant for time frame. While some wt loss may be related to dehydration, large amount of weight loss and continued poor oral intake continues to be concerning.   Per previous RD notes, pt does not tolerate sweet supplements well. Pt was just advanced to a regular diet concluding gastric emptying study earlier today.   Labs reviewed.   Diet Order:  Diet regular Room service appropriate? Yes; Fluid consistency: Thin Diet NPO time specified  Skin:  Reviewed, no issues  Last BM:  12/23/15  Height:   Ht Readings from Last 1 Encounters:  12/23/15 5' (1.524 m)    Weight:   Wt Readings from Last 1 Encounters:  12/23/15 80 lb (36.3 kg)    Ideal Body Weight:  45.5 kg  BMI:  Body mass index is 15.62 kg/m.  Estimated Nutritional Needs:   Kcal:  1100-1300  Protein:  55-70 grams  Fluid:  >1.1 L  EDUCATION NEEDS:   No education needs identified at this time  Alioune Hodgkin A. Mayford KnifeWilliams, RD, LDN, CDE Pager: [phone] After hours Pager: [phone]

## 2015-12-24 NOTE — Progress Notes (Signed)
GES nl.  Prealbumin level slightly low.  Pt lying in bed, very quiet, avoiding eye contact, stating "I'm so tired."  IMPR:  Possible somatization with associated diminished food intake/tolerance.  ???variant of adult rumination syndrome???????????????.  PLAN:  Continue w/u for organic causes--UGI tomorrow, ??egd w/ bx's Thurs??

## 2015-12-24 NOTE — Progress Notes (Signed)
PROGRESS NOTE  Lauren Bolton WUJ:[id number] DOB: [birth year] DOA: 12/23/2015 PCP: Julian Hy, MD  Brief History:  55 year old female with a history of hypothyroidism status post goiter removal, iron deficiency anemia, anxiety presented with intractable nausea and vomiting. Her symptoms began approximately 6 months ago initially with nausea and decreased food intake and has gradually progressed to the point where she is unable to consume any solid food without emesis except for rice and water.  She was admitted from 11/10/2015 through 11/14/2015 with similar symptoms. CT of the abdomen and pelvis on 11/11/2015 was negative for any acute findings. She underwent EGD performed by Dr. Madilyn Fireman on 11/13/15 which was normal. She indicates that she is not hungry, and that when she does try to eat, she immediately has to vomit the food back up. During this period of time, she has lost about 20 pounds.  Upon admission, the patient was noted to be hypokalemic with potassium 2.8. This was repleted, and [facility] was consulted to assist with management.  Assessment/Plan: Intractable nausea and vomiting -Appreciate Dr. Matthias Hughs consult-->gastric emptying study -There is concern for psychophysiologic conditions although small bowel infiltrative disease, esophageal dysmotility, or atypical biliary tract disease cannot be ruled out -RUQ ultrasound neg -12/23/2015 CT angiogram abdomen and pelvis--no bowel wall thickening, no acute vascular findings -Continue IV fluids -Cortrosyn stimulation test-->appropriate response -HIV negative -Urine drug screen positive for benzodiazepines -LFTs and lipase essentially unremarkable -Urine pregnancy test negative -TSH 0.366 -12/19--Gastric emptying study--Normal -UGI/esophagram in am 12/20  Hypokalemia -replete -Mag-1.9 -BMP in am  Anxiety -continue alprazolam -pt already sees a psychologist in outpt setting   Disposition Plan:   Home in 2-3 days    Family Communication:   Spouse update at bedside 12/19--Total time spent 35 minutes.  Greater than 50% spent face to face counseling and coordinating care.   Consultants:  [facility]  Code Status:  FULL  DVT Prophylaxis: Maud Lovenox   Procedures: As Listed in Progress Note Above  Antibiotics: None    Subjective:   Objective: Vitals:   12/23/15 1817 12/23/15 1955 12/23/15 2135 12/24/15 0558  BP: 109/72 122/65  114/66  Pulse: 72 67 71 61  Resp: 18 (!) 178 18 16  Temp:  98.3 F (36.8 C) 97.5 F (36.4 C) 97.8 F (36.6 C)  TempSrc:  Oral Oral Oral  SpO2: 100% 100% 100% 100%  Weight:  36.3 kg (80 lb)    Height:  5' (1.524 m)      Intake/Output Summary (Last 24 hours) at 12/24/15 9562 Last data filed at 12/24/15 0900  Gross per 24 hour  Intake          2804.16 ml  Output              625 ml  Net          2179.16 ml   Weight change:  Exam:   General:  Pt is alert, follows commands appropriately, not in acute distress  HEENT: No icterus, No thrush, No neck mass, [place]/AT  Cardiovascular: RRR, S1/S2, no rubs, no gallops  Respiratory: CTA bilaterally, no wheezing, no crackles, no rhonchi  Abdomen: Soft/+BS, non tender, non distended, no guarding  Extremities: No edema, No lymphangitis, No petechiae, No rashes, no synovitis   Data Reviewed: I have personally reviewed following labs and imaging studies Basic Metabolic Panel:  Recent Labs Lab 12/23/15 1255 12/23/15 1940 12/24/15 0727  NA 139  --  139  K  2.8*  --  4.4  CL 101  --  111  CO2 28  --  23  GLUCOSE 90  --  102*  BUN <5*  --  <5*  CREATININE 0.82  --  0.72  CALCIUM 9.3  --  8.8*  MG  --  1.9  --    Liver Function Tests:  Recent Labs Lab 12/23/15 1255  AST 36  ALT 35  ALKPHOS 58  BILITOT 0.9  PROT 7.7  ALBUMIN 4.2    Recent Labs Lab 12/23/15 1255  LIPASE 21   No results for input(s): AMMONIA in the last 168 hours. Coagulation Profile: No results for input(s): INR, PROTIME in  the last 168 hours. CBC:  Recent Labs Lab 12/23/15 1255 12/24/15 0727  WBC 3.9* 3.5*  NEUTROABS 2.3  --   HGB 11.2* 9.4*  HCT 32.7* 28.6*  MCV 74.1* 74.7*  PLT 239 198   Cardiac Enzymes: No results for input(s): CKTOTAL, CKMB, CKMBINDEX, TROPONINI in the last 168 hours. BNP: Invalid input(s): POCBNP CBG: No results for input(s): GLUCAP in the last 168 hours. HbA1C: No results for input(s): HGBA1C in the last 72 hours. Urine analysis:    Component Value Date/Time   COLORURINE COLORLESS (A) 12/23/2015 1249   APPEARANCEUR CLEAR 12/23/2015 1249   LABSPEC 1.002 (L) 12/23/2015 1249   PHURINE 8.0 12/23/2015 1249   GLUCOSEU NEGATIVE 12/23/2015 1249   HGBUR NEGATIVE 12/23/2015 1249   BILIRUBINUR NEGATIVE 12/23/2015 1249   KETONESUR NEGATIVE 12/23/2015 1249   PROTEINUR NEGATIVE 12/23/2015 1249   UROBILINOGEN 0.2 03/17/2010 1421   NITRITE NEGATIVE 12/23/2015 1249   LEUKOCYTESUR TRACE (A) 12/23/2015 1249   Sepsis Labs: @LABRCNTIP (procalcitonin:4,lacticidven:4) )No results found for this or any previous visit (from the past 240 hour(s)).   Scheduled Meds: . enoxaparin (LOVENOX) injection  30 mg Subcutaneous Q24H  . levothyroxine  100 mcg Oral QAC breakfast  . pantoprazole  80 mg Oral Daily   Continuous Infusions: . dextrose 5 % and 0.9 % NaCl with KCl 40 mEq/L 125 mL/hr at 12/23/15 1547    Procedures/Studies: Dg Abdomen Acute W/chest  Result Date: 12/23/2015 CLINICAL DATA:  Nausea vomiting. EXAM: DG ABDOMEN ACUTE W/ 1V CHEST COMPARISON:  06/20/2015. FINDINGS: Surgical clips in the neck. Mediastinum and hilar structures normal. Lungs are clear of acute infiltrates. Knee bilateral nodular opacities noted over both lung bases consistent prominent nipple shadows. Soft tissue structures of the abdomen unremarkable. No bowel distention. No free air. Tiny sclerotic focus in the right iliac wing is unchanged most consistent 30 small bone island. No acute bony abnormality .  IMPRESSION: Negative abdominal radiographs.  No acute cardiopulmonary disease. Electronically Signed   By: Maisie Fushomas  Register   On: 12/23/2015 13:28   Ct Angio Abd/pel W And/or Wo Contrast  Result Date: 12/23/2015 CLINICAL DATA:  Epigastric pain and diffuse abdominal tenderness with nausea and vomiting for the last 2 months. Pain has become severe today. EXAM: CTA ABDOMEN AND PELVIS WITH CONTRAST TECHNIQUE: Multidetector CT imaging of the abdomen and pelvis was performed using the standard protocol during bolus administration of intravenous contrast. Multiplanar reconstructed images and MIPs were obtained and reviewed to evaluate the vascular anatomy. CONTRAST:  80 mL Isovue 370 IV COMPARISON:  11/11/2015 FINDINGS: VASCULAR Aorta: Minimal atheromatous plaque. No aneurysm, dissection, or stenosis. Celiac: Patent SMA: Patent Renals: Both renal arteries are patent without evidence of aneurysm, dissection, vasculitis, fibromuscular dysplasia or significant stenosis. IMA: Patent without evidence of aneurysm, dissection, vasculitis or significant stenosis. Inflow: Patent  without evidence of aneurysm, dissection, vasculitis or significant stenosis. Proximal Outflow: Bilateral common femoral and visualized portions of the superficial and profunda femoral arteries are patent without evidence of aneurysm, dissection, vasculitis or significant stenosis. Veins: Early reflux down a dilated left gonadal vein. Review of the MIP images confirms the above findings. NON-VASCULAR Lower chest: Lung bases clear. No pleural or pericardial effusion. Bilateral breast implants partially visualized. Hepatobiliary: No focal liver abnormality is seen. No gallstones, gallbladder wall thickening, or biliary dilatation. Pancreas: Unremarkable. No pancreatic ductal dilatation or surrounding inflammatory changes. Spleen: Normal in size without focal abnormality. Adrenals/Urinary Tract: Adrenal glands are unremarkable. Kidneys are normal, without  renal calculi, focal lesion, or hydronephrosis. Bladder is physiologically distended. Stomach/Bowel: Stomach is within normal limits. Appendix appears normal. No evidence of bowel wall thickening, distention, or inflammatory changes. Lymphatic: No adenopathy localized. Reproductive: Dilated refluxing left gonadal vein. Uterus and bilateral adnexal regions unremarkable. Other: No ascites.  No free air. Musculoskeletal: No acute or significant osseous findings. IMPRESSION: VASCULAR 1. No acute vascular findings. 2.  Aortic Atherosclerosis (ICD10-170.0) 3. Refluxing dilated left gonadal vein. Correlate with any clinical evidence of pelvic congestion syndrome. NON-VASCULAR 1. No acute findings Electronically Signed   By: Corlis Leak  Hassell M.D.   On: 12/23/2015 15:03   [place] Abdomen Limited Ruq  Result Date: 12/23/2015 CLINICAL DATA:  Nausea and vomiting x2 months EXAM: US ABDOMEN LIMITED - RIGHT UPPER QUADRANT COMPARISON:  CT from earlier the same day FINDINGS: Gallbladder: No gallstones or wall thickening visualized. No sonographic Murphy sign noted by sonographer. Common bile duct: Diameter: 1.8 mm, unremarkable Liver: No focal lesion identified. Within normal limits in parenchymal echogenicity. IMPRESSION: 1. Negative Electronically Signed   By: Corlis Leak  Hassell M.D.   On: 12/23/2015 19:19    Naomy Esham, DO  [facility] Pager [phone]  If 7PM-7AM, please contact night-coverage [email or web address] Password [email or web address] 12/24/2015, 9:26 AM   LOS: 0 days

## 2015-12-25 ENCOUNTER — Observation Stay (HOSPITAL_COMMUNITY): Payer: BLUE CROSS/BLUE SHIELD

## 2015-12-25 DIAGNOSIS — R633 Feeding difficulties: Secondary | ICD-10-CM | POA: Diagnosis not present

## 2015-12-25 DIAGNOSIS — D509 Iron deficiency anemia, unspecified: Secondary | ICD-10-CM | POA: Diagnosis not present

## 2015-12-25 DIAGNOSIS — E876 Hypokalemia: Secondary | ICD-10-CM | POA: Diagnosis not present

## 2015-12-25 DIAGNOSIS — Z3202 Encounter for pregnancy test, result negative: Secondary | ICD-10-CM | POA: Diagnosis present

## 2015-12-25 DIAGNOSIS — G4700 Insomnia, unspecified: Secondary | ICD-10-CM | POA: Diagnosis present

## 2015-12-25 DIAGNOSIS — E039 Hypothyroidism, unspecified: Secondary | ICD-10-CM | POA: Diagnosis not present

## 2015-12-25 DIAGNOSIS — D649 Anemia, unspecified: Secondary | ICD-10-CM | POA: Diagnosis not present

## 2015-12-25 DIAGNOSIS — F418 Other specified anxiety disorders: Secondary | ICD-10-CM | POA: Diagnosis not present

## 2015-12-25 DIAGNOSIS — Z681 Body mass index (BMI) 19 or less, adult: Secondary | ICD-10-CM | POA: Diagnosis not present

## 2015-12-25 DIAGNOSIS — R112 Nausea with vomiting, unspecified: Secondary | ICD-10-CM | POA: Diagnosis not present

## 2015-12-25 DIAGNOSIS — Z888 Allergy status to other drugs, medicaments and biological substances status: Secondary | ICD-10-CM | POA: Diagnosis not present

## 2015-12-25 DIAGNOSIS — R1110 Vomiting, unspecified: Secondary | ICD-10-CM | POA: Diagnosis not present

## 2015-12-25 DIAGNOSIS — K2950 Unspecified chronic gastritis without bleeding: Secondary | ICD-10-CM | POA: Diagnosis not present

## 2015-12-25 DIAGNOSIS — F411 Generalized anxiety disorder: Secondary | ICD-10-CM | POA: Diagnosis not present

## 2015-12-25 DIAGNOSIS — Z9882 Breast implant status: Secondary | ICD-10-CM | POA: Diagnosis not present

## 2015-12-25 DIAGNOSIS — E43 Unspecified severe protein-calorie malnutrition: Secondary | ICD-10-CM | POA: Diagnosis present

## 2015-12-25 DIAGNOSIS — R634 Abnormal weight loss: Secondary | ICD-10-CM | POA: Diagnosis not present

## 2015-12-25 DIAGNOSIS — E46 Unspecified protein-calorie malnutrition: Secondary | ICD-10-CM | POA: Diagnosis not present

## 2015-12-25 DIAGNOSIS — Z9889 Other specified postprocedural states: Secondary | ICD-10-CM | POA: Diagnosis not present

## 2015-12-25 DIAGNOSIS — F4323 Adjustment disorder with mixed anxiety and depressed mood: Secondary | ICD-10-CM | POA: Diagnosis not present

## 2015-12-25 LAB — COMPREHENSIVE METABOLIC PANEL
ALK PHOS: 43 U/L (ref 38–126)
ALT: 22 U/L (ref 14–54)
ANION GAP: 3 — AB (ref 5–15)
AST: 23 U/L (ref 15–41)
Albumin: 2.9 g/dL — ABNORMAL LOW (ref 3.5–5.0)
BUN: <5 mg/dL — ABNORMAL LOW (ref 6–20)
CALCIUM: 9 mg/dL (ref 8.9–10.3)
CHLORIDE: 111 mmol/L (ref 101–111)
CO2: 25 mmol/L (ref 22–32)
Creatinine, Ser: 0.71 mg/dL (ref 0.44–1.00)
GFR calc non Af Amer: >60 mL/min (ref >60)
Glucose, Bld: 150 mg/dL — ABNORMAL HIGH (ref 65–99)
POTASSIUM: 4.4 mmol/L (ref 3.5–5.1)
SODIUM: 139 mmol/L (ref 135–145)
Total Bilirubin: 0.4 mg/dL (ref 0.3–1.2)
Total Protein: 5.6 g/dL — ABNORMAL LOW (ref 6.5–8.1)

## 2015-12-25 NOTE — Progress Notes (Signed)
Patient's upper GI series today was normal, with prompt passage of the barium tablet into the stomach, no evidence of overt esophageal dysmotility or gastric anatomic abnormalities. Barium passed  into the duodenum. the patient, by her own description, was very nervous and shaking at the time of the procedure, which somewhat interfere with doing an optimal study.  Plan:  1. Upper endoscopy tomorrow, for the specific purpose of obtaining random mucosal biopsies of the small bowel, which were not obtained at the time of her endoscopy 6 weeks ago. I expect that these will be negative, but I think that this is needed to help complete her evaluation for possible "organic" causes of her symptoms. I have discussed the purpose and risks (anesthesia risk, aspiration, perforation, ?bleeding) of upper endoscopy with the patient and her husband at the bedside, and they are agreeable to proceed.  2. I have started discussed with the patient and her husband that she is clearly a very anxious person, and that it is quite possible that her "nerves" are affecting her GI tract, causing the symptoms she is experiencing.  Florencia Reasonsobert V. Francess Mullen, M.D. Pager [phone] If no answer or after 5 PM call [phone]

## 2015-12-25 NOTE — Progress Notes (Signed)
PROGRESS NOTE    Lauren Bolton  RUE:[id number]:[id number] DOB: 1960/04/16 DOA: 12/23/2015 PCP: Lauren Bolton   Chief Complaint  Patient presents with  . Nausea     Brief Narrative:  55 year old female with a history of hypothyroidism status post goiter removal, iron deficiency anemia, anxiety presented with intractable nausea and vomiting. Her symptoms began approximately 6 months ago initially with nausea and decreased food intake and has gradually progressed to the point where she is unable to consume any solid food without emesis except for rice and water.  She was admitted from 11/10/2015 through 11/14/2015 with similar symptoms. CT of the abdomen and pelvis on 11/11/2015 was negative for any acute findings. She underwent EGD performed by Dr. Madilyn Bolton on 11/13/15 which was normal. She indicates that she is not hungry, and that when she does try to eat, she immediately has to vomit thefood back up. During this period of time, she has lost about 20 pounds.  Upon admission, the patient was noted to be hypokalemic with potassium 2.8. This was repleted, and [facility] was consulted to assist with management.  Assessment & Plan   Intractable nausea and vomiting -Gastroenterology consulted and appreciated, recommended gastric emptying study which was unremarkable.  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 Patient may need EGD with biopsy.  -There is concern for psychophysiologic conditions although small bowel infiltrative disease, esophageal dysmotility, or atypical biliary tract disease cannot be ruled out -RUQ ultrasound neg -12/23/2015 CT angiogram abdomen and pelvis--no bowel wall thickening, no acute vascular findings -Continue IV fluids -Cortrosyn stimulation test-->appropriate response -HIV negative -Urine drug screen positive for benzodiazepines -LFTs and lipase essentially unremarkable -Urine pregnancy test negative -TSH 0.366 -UGI/esophagram planned for today,  12/20  Hypokalemia -Resolved, likely secondary to GI losses/poor intake -Mag-1.9 -Continue to monitor BMP  Anxiety -continue alprazolam -pt already sees a psychologist in outpt setting- may also need psychiatry referral  DVT Prophylaxis  Lovenox  Code Status: Full  Family Communication: Husband at bedside  Disposition Plan: Currently in observation. Home when stable  Consultants [facility]  Procedures  Gastric emptying study  Antibiotics   Anti-infectives    None      Subjective:   Lauren Bolton seen and examined today. Continues to complain of nausea, vomiting, poor intake.  Denies chest pain, shortness of breath, dizziness, headache. Husband has many questions about what the cause of these symptoms, feels she need someone to see her for her "head".    Objective:   Vitals:   12/24/15 0558 12/24/15 1506 12/24/15 2126 12/25/15 0515  BP: 114/66 (!) 141/73 (!) 146/85 103/66  Pulse: 61 79 75 73  Resp: 16 18 18 16   Temp: 97.8 F (36.6 C) 98.2 F (36.8 C) 98.1 F (36.7 C) 97.9 F (36.6 C)  TempSrc: Oral Oral Oral Oral  SpO2: 100% 100% 100% 99%  Weight:      Height:        Intake/Output Summary (Last 24 hours) at 12/25/15 1311 Last data filed at 12/25/15 0900  Gross per 24 hour  Intake          3206.66 ml  Output                0 ml  Net          3206.66 ml   Filed Weights   12/23/15 1152 12/23/15 1955  Weight: 36.7 kg (81 lb) 36.3 kg (80 lb)    Exam  General: Well developed, well nourished, NAD, appears stated age  HEENT: NCAT,  mucous membranes moist.   Neck: Supple, no JVD, no masses  Cardiovascular: S1 S2 auscultated, no rubs, murmurs or gallops. Regular rate and rhythm.  Respiratory: Clear to auscultation bilaterally with equal chest rise  Abdomen: Soft, nontender, nondistended, + bowel sounds  Extremities: warm dry without cyanosis clubbing or edema  Neuro: AAOx3, nonfocal  Skin: Without rashes exudates or nodules  Psych: Normal  affect and demeanor   Data Reviewed: I have personally reviewed following labs and imaging studies  CBC:  Recent Labs Lab 12/23/15 1255 12/24/15 0727  WBC 3.9* 3.5*  NEUTROABS 2.3  --   HGB 11.2* 9.4*  HCT 32.7* 28.6*  MCV 74.1* 74.7*  PLT 239 198   Basic Metabolic Panel:  Recent Labs Lab 12/23/15 1255 12/23/15 1940 12/24/15 0727 12/25/15 0350  NA 139  --  139 139  K 2.8*  --  4.4 4.4  CL 101  --  111 111  CO2 28  --  23 25  GLUCOSE 90  --  102* 150*  BUN <5*  --  <5* <5*  CREATININE 0.82  --  0.72 0.71  CALCIUM 9.3  --  8.8* 9.0  MG  --  1.9  --   --    GFR: Estimated Creatinine Clearance: 45.5 mL/min (by C-G formula based on SCr of 0.71 mg/dL). Liver Function Tests:  Recent Labs Lab 12/23/15 1255 12/25/15 0350  AST 36 23  ALT 35 22  ALKPHOS 58 43  BILITOT 0.9 0.4  PROT 7.7 5.6*  ALBUMIN 4.2 2.9*    Recent Labs Lab 12/23/15 1255  LIPASE 21   No results for input(s): AMMONIA in the last 168 hours. Coagulation Profile: No results for input(s): INR, PROTIME in the last 168 hours. Cardiac Enzymes: No results for input(s): CKTOTAL, CKMB, CKMBINDEX, TROPONINI in the last 168 hours. BNP (last 3 results) No results for input(s): PROBNP in the last 8760 hours. HbA1C: No results for input(s): HGBA1C in the last 72 hours. CBG: No results for input(s): GLUCAP in the last 168 hours. Lipid Profile: No results for input(s): CHOL, HDL, LDLCALC, TRIG, CHOLHDL, LDLDIRECT in the last 72 hours. Thyroid Function Tests:  Recent Labs  12/23/15 1940  TSH 0.366   Anemia Panel: No results for input(s): VITAMINB12, FOLATE, FERRITIN, TIBC, IRON, RETICCTPCT in the last 72 hours. Urine analysis:    Component Value Date/Time   COLORURINE COLORLESS (A) 12/23/2015 1249   APPEARANCEUR CLEAR 12/23/2015 1249   LABSPEC 1.002 (L) 12/23/2015 1249   PHURINE 8.0 12/23/2015 1249   GLUCOSEU NEGATIVE 12/23/2015 1249   HGBUR NEGATIVE 12/23/2015 1249   BILIRUBINUR NEGATIVE  12/23/2015 1249   KETONESUR NEGATIVE 12/23/2015 1249   PROTEINUR NEGATIVE 12/23/2015 1249   UROBILINOGEN 0.2 03/17/2010 1421   NITRITE NEGATIVE 12/23/2015 1249   LEUKOCYTESUR TRACE (A) 12/23/2015 1249   Sepsis Labs: @LABRCNTIP (procalcitonin:4,lacticidven:4)  )No results found for this or any previous visit (from the past 240 hour(s)).    Radiology Studies: Nm Gastric Emptying  Result Date: 12/24/2015 CLINICAL DATA:  Nausea and vomiting for 4 months. EXAM: NUCLEAR MEDICINE GASTRIC EMPTYING SCAN TECHNIQUE: After oral ingestion of radiolabeled meal, sequential abdominal images were obtained for 120 minutes. Residual percentage of activity remaining within the stomach was calculated at 60 and 120 minutes. RADIOPHARMACEUTICALS:  2.0 mCi Tc-24m sulfur colloid in standardized meal COMPARISON:  None. FINDINGS: Expected location of the stomach in the left upper quadrant. Ingested meal empties the stomach gradually over the course of the study with  22% retention at 60 min and 6% retention at 120 min (normal retention less than 30% at a 120 min). IMPRESSION: Normal gastric emptying study. Electronically Signed   By: Lupita Raider, M.D.   On: 12/24/2015 11:53   Dg Abdomen Acute W/chest  Result Date: 12/23/2015 CLINICAL DATA:  Nausea vomiting. EXAM: DG ABDOMEN ACUTE W/ 1V CHEST COMPARISON:  06/20/2015. FINDINGS: Surgical clips in the neck. Mediastinum and hilar structures normal. Lungs are clear of acute infiltrates. Knee bilateral nodular opacities noted over both lung bases consistent prominent nipple shadows. Soft tissue structures of the abdomen unremarkable. No bowel distention. No free air. Tiny sclerotic focus in the right iliac wing is unchanged most consistent 30 small bone island. No acute bony abnormality . IMPRESSION: Negative abdominal radiographs.  No acute cardiopulmonary disease. Electronically Signed   By: Maisie Fus  Register   On: 12/23/2015 13:28   Dg Kayleen Memos  W/kub  Result Date:  12/25/2015 CLINICAL DATA:  Nausea and vomiting over the past few months. Weight loss. EXAM: UPPER GI SERIES WITH KUB TECHNIQUE: After obtaining a scout radiograph a routine upper GI series was performed using thin barium. FLUOROSCOPY TIME:  Fluoroscopy Time:  1 minutes, 46 seconds Radiation Exposure Index (if provided by the fluoroscopic device): Number of Acquired Spot Images: 2 COMPARISON:  Multiple exams, including 12/23/2015. FINDINGS: Initial KUB appears unremarkable. The patient was tremulous and nauseated and dizzy, and accordingly I was not able to stand her up for the exam or perform the full double contrast assessment. A single contrast assessment was performed. The pharyngeal phase of swallowing was not assessed. Clips in the neck from prior thyroidectomy. Primary peristaltic waves in the esophagus were normal on 4 out of 4 swallows. No esophageal ulceration or stricture. A 13 mm barium tablet passed without difficulty into the stomach. No distal esophageal mucosal fold thickening or significant hiatal hernia was observed. Normal duodenal configuration. Mild accentuation of rugal folds along the greater curvature ascribed to nondistention. No filling defect in the stomach at balloon compression. Duodenal bulb, stomach antrum, and proximal duodenum unremarkable. IMPRESSION: 1. No specific abnormality to account for the patient's chronic nausea. Electronically Signed   By: Gaylyn Rong M.D.   On: 12/25/2015 09:26   Ct Angio Abd/pel W And/or Wo Contrast  Result Date: 12/23/2015 CLINICAL DATA:  Epigastric pain and diffuse abdominal tenderness with nausea and vomiting for the last 2 months. Pain has become severe today. EXAM: CTA ABDOMEN AND PELVIS WITH CONTRAST TECHNIQUE: Multidetector CT imaging of the abdomen and pelvis was performed using the standard protocol during bolus administration of intravenous contrast. Multiplanar reconstructed images and MIPs were obtained and reviewed to evaluate  the vascular anatomy. CONTRAST:  80 mL Isovue 370 IV COMPARISON:  11/11/2015 FINDINGS: VASCULAR Aorta: Minimal atheromatous plaque. No aneurysm, dissection, or stenosis. Celiac: Patent SMA: Patent Renals: Both renal arteries are patent without evidence of aneurysm, dissection, vasculitis, fibromuscular dysplasia or significant stenosis. IMA: Patent without evidence of aneurysm, dissection, vasculitis or significant stenosis. Inflow: Patent without evidence of aneurysm, dissection, vasculitis or significant stenosis. Proximal Outflow: Bilateral common femoral and visualized portions of the superficial and profunda femoral arteries are patent without evidence of aneurysm, dissection, vasculitis or significant stenosis. Veins: Early reflux down a dilated left gonadal vein. Review of the MIP images confirms the above findings. NON-VASCULAR Lower chest: Lung bases clear. No pleural or pericardial effusion. Bilateral breast implants partially visualized. Hepatobiliary: No focal liver abnormality is seen. No gallstones, gallbladder wall thickening, or biliary dilatation.  Pancreas: Unremarkable. No pancreatic ductal dilatation or surrounding inflammatory changes. Spleen: Normal in size without focal abnormality. Adrenals/Urinary Tract: Adrenal glands are unremarkable. Kidneys are normal, without renal calculi, focal lesion, or hydronephrosis. Bladder is physiologically distended. Stomach/Bowel: Stomach is within normal limits. Appendix appears normal. No evidence of bowel wall thickening, distention, or inflammatory changes. Lymphatic: No adenopathy localized. Reproductive: Dilated refluxing left gonadal vein. Uterus and bilateral adnexal regions unremarkable. Other: No ascites.  No free air. Musculoskeletal: No acute or significant osseous findings. IMPRESSION: VASCULAR 1. No acute vascular findings. 2.  Aortic Atherosclerosis (ICD10-170.0) 3. Refluxing dilated left gonadal vein. Correlate with any clinical evidence of  pelvic congestion syndrome. NON-VASCULAR 1. No acute findings Electronically Signed   By: Corlis Leak  Hassell M.D.   On: 12/23/2015 15:03   [place] Abdomen Limited Ruq  Result Date: 12/23/2015 CLINICAL DATA:  Nausea and vomiting x2 months EXAM: US ABDOMEN LIMITED - RIGHT UPPER QUADRANT COMPARISON:  CT from earlier the same day FINDINGS: Gallbladder: No gallstones or wall thickening visualized. No sonographic Murphy sign noted by sonographer. Common bile duct: Diameter: 1.8 mm, unremarkable Liver: No focal lesion identified. Within normal limits in parenchymal echogenicity. IMPRESSION: 1. Negative Electronically Signed   By: Corlis Leak  Hassell M.D.   On: 12/23/2015 19:19     Scheduled Meds: . enoxaparin (LOVENOX) injection  30 mg Subcutaneous Q24H  . feeding supplement (PRO-STAT SUGAR FREE 64)  30 mL Oral TID BM  . levothyroxine  100 mcg Oral QAC breakfast  . pantoprazole  80 mg Oral Daily   Continuous Infusions: . dextrose 5 % and 0.9 % NaCl with KCl 40 mEq/L 125 mL/hr at 12/25/15 0638     LOS: 0 days   Time Spent in minutes   30 minutes  Jacorie Ernsberger D.O. on 12/25/2015 at 1:11 PM  Between 7am to 7pm - Pager - [phone]  After 7pm go to [email or web address] - password [email or web address]  And look for the night coverage person covering for me after hours  [facility] Office  [phone]

## 2015-12-26 ENCOUNTER — Encounter (HOSPITAL_COMMUNITY): Payer: Self-pay | Admitting: *Deleted

## 2015-12-26 ENCOUNTER — Inpatient Hospital Stay (HOSPITAL_COMMUNITY): Payer: BLUE CROSS/BLUE SHIELD | Admitting: Certified Registered Nurse Anesthetist

## 2015-12-26 ENCOUNTER — Encounter (HOSPITAL_COMMUNITY)
Admission: EM | Disposition: A | Discharge status: Home / Self Care | Payer: Self-pay | Source: Home / Self Care | Attending: Internal Medicine

## 2015-12-26 DIAGNOSIS — Z888 Allergy status to other drugs, medicaments and biological substances status: Secondary | ICD-10-CM

## 2015-12-26 DIAGNOSIS — F4323 Adjustment disorder with mixed anxiety and depressed mood: Secondary | ICD-10-CM

## 2015-12-26 DIAGNOSIS — Z79899 Other long term (current) drug therapy: Secondary | ICD-10-CM

## 2015-12-26 DIAGNOSIS — R45851 Suicidal ideations: Secondary | ICD-10-CM

## 2015-12-26 DIAGNOSIS — K2950 Unspecified chronic gastritis without bleeding: Secondary | ICD-10-CM | POA: Diagnosis not present

## 2015-12-26 DIAGNOSIS — F411 Generalized anxiety disorder: Secondary | ICD-10-CM

## 2015-12-26 DIAGNOSIS — D509 Iron deficiency anemia, unspecified: Secondary | ICD-10-CM

## 2015-12-26 DIAGNOSIS — Z9889 Other specified postprocedural states: Secondary | ICD-10-CM

## 2015-12-26 HISTORY — PX: ESOPHAGOGASTRODUODENOSCOPY (EGD) WITH PROPOFOL: SHX5813

## 2015-12-26 LAB — BASIC METABOLIC PANEL
ANION GAP: 4 — AB (ref 5–15)
BUN: 7 mg/dL (ref 6–20)
CALCIUM: 8.6 mg/dL — AB (ref 8.9–10.3)
CO2: 23 mmol/L (ref 22–32)
Chloride: 113 mmol/L — ABNORMAL HIGH (ref 101–111)
Creatinine, Ser: 0.7 mg/dL (ref 0.44–1.00)
GFR calc Af Amer: >60 mL/min (ref >60)
GLUCOSE: 128 mg/dL — AB (ref 65–99)
Potassium: 3.9 mmol/L (ref 3.5–5.1)
Sodium: 140 mmol/L (ref 135–145)

## 2015-12-26 LAB — CBC
HCT: 25.6 % — ABNORMAL LOW (ref 36.0–46.0)
Hemoglobin: 8.3 g/dL — ABNORMAL LOW (ref 12.0–15.0)
MCH: 24.5 pg — ABNORMAL LOW (ref 26.0–34.0)
MCHC: 32.4 g/dL (ref 30.0–36.0)
MCV: 75.5 fL — AB (ref 78.0–100.0)
PLATELETS: 182 10*3/uL (ref 150–400)
RBC: 3.39 MIL/uL — ABNORMAL LOW (ref 3.87–5.11)
RDW: 14.1 % (ref 11.5–15.5)
WBC: 9.4 10*3/uL (ref 4.0–10.5)

## 2015-12-26 SURGERY — ESOPHAGOGASTRODUODENOSCOPY (EGD) WITH PROPOFOL
Anesthesia: Monitor Anesthesia Care

## 2015-12-26 MED ORDER — LORAZEPAM 0.5 MG PO TABS: 0.5000 mg | ORAL_TABLET | Freq: Two times a day (BID) | ORAL | Status: DC | PRN

## 2015-12-26 MED ORDER — SODIUM CHLORIDE 0.9 % IV SOLN: INTRAVENOUS | Status: DC

## 2015-12-26 MED ORDER — PROPOFOL 10 MG/ML IV BOLUS
INTRAVENOUS | Status: DC | PRN
  Administered 2015-12-26 (×3): 20 mg via INTRAVENOUS

## 2015-12-26 MED ORDER — LACTATED RINGERS IV SOLN
INTRAVENOUS | Status: DC
  Administered 2015-12-26: 1000 mL via INTRAVENOUS

## 2015-12-26 MED ORDER — MIRTAZAPINE 7.5 MG PO TABS
7.5000 mg | ORAL_TABLET | Freq: Every day | ORAL | Status: DC
  Administered 2015-12-27: 7.5 mg via ORAL
  Filled 2015-12-26 (×2): qty 1

## 2015-12-26 MED ORDER — LIDOCAINE 2% (20 MG/ML) 5 ML SYRINGE
INTRAMUSCULAR | Status: DC | PRN
  Administered 2015-12-26: 40 mg via INTRAVENOUS

## 2015-12-26 MED ORDER — PROPOFOL 500 MG/50ML IV EMUL
INTRAVENOUS | Status: DC | PRN
  Administered 2015-12-26: 50 ug/kg/min via INTRAVENOUS

## 2015-12-26 NOTE — Anesthesia Preprocedure Evaluation (Signed)
Anesthesia Evaluation  Patient identified by MRN, date of birth, ID band Patient awake    Reviewed: Allergy & Precautions, NPO status , Patient's Chart, lab work & pertinent test results  History of Anesthesia Complications Negative for: history of anesthetic complications  Airway Mallampati: II  TM Distance: >3 FB Neck ROM: Full    Dental  (+) Dental Advisory Given   Pulmonary neg pulmonary ROS,    breath sounds clear to auscultation       Cardiovascular negative cardio ROS   Rhythm:Regular Rate:Normal     Neuro/Psych Anxiety negative neurological ROS     GI/Hepatic Neg liver ROS, GERD  Medicated and Poorly Controlled,  Endo/Other  Hypothyroidism   Renal/GU negative Renal ROS     Musculoskeletal   Abdominal   Peds  Hematology  (+) Blood dyscrasia (Hb 9.2), anemia ,   Anesthesia Other Findings   Reproductive/Obstetrics                             Anesthesia Physical Anesthesia Plan  ASA: II  Anesthesia Plan: MAC   Post-op Pain Management:    Induction: Intravenous  Airway Management Planned: Natural Airway, Nasal Cannula and Simple Face Mask  Additional Equipment: None  Intra-op Plan:   Post-operative Plan:   Informed Consent: I have reviewed the patients History and Physical, chart, labs and discussed the procedure including the risks, benefits and alternatives for the proposed anesthesia with the patient or authorized representative who has indicated his/her understanding and acceptance.   Dental advisory given  Plan Discussed with: CRNA and Surgeon  Anesthesia Plan Comments:         Anesthesia Quick Evaluation

## 2015-12-26 NOTE — Op Note (Signed)
[facility] Patient Name: Lauren DerrySopheap Dygert Procedure Date : 12/26/2015 MRN: [id number] Attending MD: Bernette Redbirdobert Oswin Johal , MD Date of Birth: Sep 25, 1960 CSN: [id number] Age: [id number] Admit Type: Inpatient Procedure:                Upper GI endoscopy Indications:              Nausea with vomiting, Weight loss, Food intolerance. Providers:                Bernette Redbirdobert Keir Viernes, MD, Dwain SarnaPatricia Ford, RN, Rolm BookbinderJackie Aiken                            Tech, Technician, Arlice Coltarrie Maness, CRNA Referring MD:             Dr. Ardyth HarpsSteve South Medicines:                Monitored Anesthesia Care Complications:            No immediate complications. Estimated Blood Loss:     Estimated blood loss: 2 mL. Procedure:                Pre-Anesthesia Assessment:                           - Prior to the procedure, a History and Physical                            was performed, and patient medications and                            allergies were reviewed. The patient's tolerance of                            previous anesthesia was also reviewed. The risks                            and benefits of the procedure and the sedation                            options and risks were discussed with the patient.                            All questions were answered, and informed consent                            was obtained. Prior Anticoagulants: The patient has                            taken Lovenox (enoxaparin), last dose was 1 day                            prior to procedure. ASA Grade Assessment: II - A                            patient with mild systemic disease. After reviewing  the risks and benefits, the patient was deemed in                            satisfactory condition to undergo the procedure.                           After obtaining informed consent, the endoscope was                            passed under direct vision. Throughout the                            procedure, the patient's  blood pressure, pulse, and                            oxygen saturations were monitored continuously. The                            [id number]-[id number] ([id number]) scope was introduced through the                            mouth, and advanced to the second part of duodenum.                            The upper GI endoscopy was accomplished without                            difficulty. The patient tolerated the procedure                            well. Scope In: Scope Out: Findings:      The examined esophagus was normal.      There is no endoscopic evidence of Barrett's esophagus, esophagitis,       hiatal hernia or stricture in the entire esophagus.      The entire examined stomach was normal. There was no bile reflux, no       gastric residual at the start of the procedure. Motility was noted.       There appeared to be normal distensibility, without evidence of       submucosal infiltration. The mucosa had a completely normal appearance,       but biopsies were taken with a cold forceps for histology.      The cardia and gastric fundus were normal on retroflexion.      The examined duodenum was normal. Biopsies were taken with a cold       forceps for histology, looking for small bowel mucosal disease that       might account for the patient's weight loss and food intolerance.       Estimated blood loss: 2 mL. Impression:               - Normal esophagus.                           - Normal stomach. Biopsied.                           -  Normal examined duodenum. Biopsied.                           - No source of patient's symptoms endoscopically                            evident. Moderate Sedation:      This patient was sedated with monitored anesthesia care, not moderate       sedation. Recommendation:           - Await pathology results.                           - Continue present medications.                           - Resume previous diet. Procedure Code(s):        --- Professional  ---                           [phone], Esophagogastroduodenoscopy, flexible,                            transoral; with biopsy, single or multiple Diagnosis Code(s):        --- Professional ---                           R11.2, Nausea with vomiting, unspecified                           R63.4, Abnormal weight loss CPT copyright 2016 [employer]. All rights reserved. The codes documented in this report are preliminary and upon coder review may  be revised to meet current compliance requirements. Bernette Redbirdobert Bryne Lindon, MD 12/26/2015 9:36:51 AM This report has been signed electronically. Number of Addenda: 0

## 2015-12-26 NOTE — Consult Note (Signed)
[facility] Face-to-Face Psychiatry Consult   Reason for Consult:   Referring Physician:  Dr. Ree Kida Patient Identification: Lauren Bolton MRN:  [id number] Principal Diagnosis: Adjustment disorder with mixed anxiety and depressed mood Diagnosis:   Patient Active Problem List   Diagnosis Date Noted  . Adjustment disorder with mixed anxiety and depressed mood [F43.23] 12/26/2015  . Malnutrition ([place]) [E46]   . Intractable vomiting [R11.10] 12/23/2015  . Hypokalemia [E87.6] 11/11/2015  . Iron deficiency anemia [D50.9] 11/11/2015  . Malaise and fatigue [R53.81, R53.83] 11/11/2015  . Failure to thrive in adult [R62.7] 11/11/2015  . Protein-calorie malnutrition, severe [E43] 11/11/2015  . Nausea with vomiting [R11.2] 11/11/2015  . Anxiety state [F41.1]   . Decreased oral intake [R63.8]   . Hypothyroidism [E03.9] 11/10/2015    Total Time spent with patient: 45 minutes  Subjective/HPI:   Lauren Bolton is a 55 y.o. female with history of depression, anxiety, hypothyroidism status post goiter removal, iron deficiency anemia presented with intractable nausea and vomiting. Psychiatry is consulted for depression, anxiety and to find out any psychological components which can be contributed to her intractable nausea and vomiting.   - Per chart review, patient had a similar presentation, which required admission in 11/2015. Patient lost 20 ponds per report.  - Upper GI done 12/20; normal, pending biopsy (Patient underwent CT of the abdomen and pelvis on 11/11/2015 was negative for any acute findings. She underwent EGD performed on 11/8/17which was normal) - Patient received total of 1 mg xanax prn for anxiety over 24 hours  Patient reports that she feels sad that she has not been able to eat at all. She has no appetite for a couple of months. She denies any intention to limit her intake and wants to eat. She has some gag reflex when she tries to swallow something. She cannot think of any stress or reason  she experiences her symptoms. She feels frustrated that nobody has been able to find the cause. She is so overwhelmed that she occasionally has SI of overdosing medication. She "don't know" if she has any intention. She has good relationship with her husband, although she has no friends. She wants to be by herself, although she used to "go anywhere." At the same time, she feels "scared" with no reason and wants her husband to be with her all the time.   She reports insomnia. She endorses anhedonia. She feels anxious and find xanax to be helpful. She denies HI. She denies AH/VH. She is concerned about hygiene and washes her hands multiple of times per day. It started a couple of months ago. She denies any history of eating disorder or purging. She denies any distorted self images. She denies history of trauma. She denies alcohol use or drug use.   Patient husband is at the bedside.  He states that her symptoms started since August 2017. She used to be doing very well and cannot think of any reason to trigger her symptoms. She seems to be more anxious lately. She has no known history of mental health issues.   Past Psychiatric History:  Outpatient: denies (buut reports seeing some for talking in the past) Psychiatry admission: deneis Previous suicide attempt: denies Past trials of medication: sertraline (cause vomiting) History of violence: denies  Risk to Self: Is patient at risk for suicide?: No Risk to Others:   Prior Inpatient Therapy:   Prior Outpatient Therapy:    Past Medical History:  Past Medical History:  Diagnosis Date  . Iron deficiency anemia   .  Thyroid disease     Past Surgical History:  Procedure Laterality Date  . ESOPHAGOGASTRODUODENOSCOPY N/A 11/13/2015   Procedure: ESOPHAGOGASTRODUODENOSCOPY (EGD);  Surgeon: Teena Irani, MD;  Location: [facility];  Service: Endoscopy;  Laterality: N/A;  . PLACEMENT OF BREAST IMPLANTS     20 years ago  . thyroid goiter     Family  History: History reviewed. No pertinent family history. Family Psychiatric  History: denies Social History:  History  Alcohol Use No     History  Drug Use No    Social History   Social History  . Marital status: Married    Spouse name: N/A  . Number of children: N/A  . Years of education: N/A   Social History Main Topics  . Smoking status: Never Smoker  . Smokeless tobacco: Never Used  . Alcohol use No  . Drug use: No  . Sexual activity: Not Asked   Other Topics Concern  . None   Social History Narrative  . None   Additional Social History:  Lives with her husband, and one child. She has two children She used to work at [employer] before her GI symptoms started.  Moved from [place] in 1983 with her family.   Allergies:   Allergies  Allergen Reactions  . Benadryl [Diphenhydramine Hcl] Diarrhea and Other (See Comments)    Reaction:  Makes her excessively sleepy     Labs:  Results for orders placed or performed during the hospital encounter of 12/23/15 (from the past 48 hour(s))  Comprehensive metabolic panel     Status: Abnormal   Collection Time: 12/25/15  3:50 AM  Result Value Ref Range   Sodium 139 135 - 145 mmol/L   Potassium 4.4 3.5 - 5.1 mmol/L   Chloride 111 101 - 111 mmol/L   CO2 25 22 - 32 mmol/L   Glucose, Bld 150 (H) 65 - 99 mg/dL   BUN <5 (L) 6 - 20 mg/dL   Creatinine, Ser 0.71 0.44 - 1.00 mg/dL   Calcium 9.0 8.9 - 10.3 mg/dL   Total Protein 5.6 (L) 6.5 - 8.1 g/dL   Albumin 2.9 (L) 3.5 - 5.0 g/dL   AST 23 15 - 41 U/L   ALT 22 14 - 54 U/L   Alkaline Phosphatase 43 38 - 126 U/L   Total Bilirubin 0.4 0.3 - 1.2 mg/dL   GFR calc non Af Amer >60 >60 mL/min   GFR calc Af Amer >60 >60 mL/min    Comment: (NOTE) The eGFR has been calculated using the CKD EPI equation. This calculation has not been validated in all clinical situations. eGFR's persistently <60 mL/min signify possible Chronic Kidney Disease.    Anion gap 3 (L) 5 - 15  CBC      Status: Abnormal   Collection Time: 12/26/15  4:13 AM  Result Value Ref Range   WBC 9.4 4.0 - 10.5 K/uL   RBC 3.39 (L) 3.87 - 5.11 MIL/uL   Hemoglobin 8.3 (L) 12.0 - 15.0 g/dL   HCT 25.6 (L) 36.0 - 46.0 %   MCV 75.5 (L) 78.0 - 100.0 fL   MCH 24.5 (L) 26.0 - 34.0 pg   MCHC 32.4 30.0 - 36.0 g/dL   RDW 14.1 11.5 - 15.5 %   Platelets 182 150 - 400 K/uL  Basic metabolic panel     Status: Abnormal   Collection Time: 12/26/15  4:13 AM  Result Value Ref Range   Sodium 140 135 - 145 mmol/L  Potassium 3.9 3.5 - 5.1 mmol/L   Chloride 113 (H) 101 - 111 mmol/L   CO2 23 22 - 32 mmol/L   Glucose, Bld 128 (H) 65 - 99 mg/dL   BUN 7 6 - 20 mg/dL   Creatinine, Ser 0.70 0.44 - 1.00 mg/dL   Calcium 8.6 (L) 8.9 - 10.3 mg/dL   GFR calc non Af Amer >60 >60 mL/min   GFR calc Af Amer >60 >60 mL/min    Comment: (NOTE) The eGFR has been calculated using the CKD EPI equation. This calculation has not been validated in all clinical situations. eGFR's persistently <60 mL/min signify possible Chronic Kidney Disease.    Anion gap 4 (L) 5 - 15    Current Facility-Administered Medications  Medication Dose Route Frequency Provider Last Rate Last Dose  . ALPRAZolam Duanne Moron) tablet 0.25 mg  0.25 mg Oral TID PRN Geradine Girt, DO   0.25 mg at 12/26/15 1607  . dextrose 5 % and 0.9 % NaCl with KCl 40 mEq/L infusion   Intravenous Continuous Clayton Bibles, PA-C 125 mL/hr at 12/26/15 1019    . enoxaparin (LOVENOX) injection 30 mg  30 mg Subcutaneous Q24H Donalynn Furlong [place], RPH   30 mg at 12/25/15 2235  . feeding supplement (PRO-STAT SUGAR FREE 64) liquid 30 mL  30 mL Oral TID BM Orson Eva, MD   30 mL at 12/26/15 1421  . levothyroxine (SYNTHROID, LEVOTHROID) tablet 100 mcg  100 mcg Oral QAC breakfast Geradine Girt, DO   100 mcg at 12/26/15 1014  . ondansetron (ZOFRAN) tablet 4 mg  4 mg Oral Q6H PRN Geradine Girt, DO       Or  . ondansetron (ZOFRAN) injection 4 mg  4 mg Intravenous Q6H PRN Geradine Girt, DO      .  pantoprazole (PROTONIX) EC tablet 80 mg  80 mg Oral Daily Jessica U Vann, DO   80 mg at 12/26/15 1017  . simethicone (MYLICON) chewable tablet 160 mg  160 mg Oral QID PRN Jeryl Columbia, NP   160 mg at 12/26/15 1144    Musculoskeletal: Strength & Muscle Tone: within normal limits Gait & Station: normal Patient leans: N/A  Psychiatric Specialty Exam: Physical Exam  Review of Systems  Gastrointestinal: Positive for nausea and vomiting.  Psychiatric/Behavioral: Positive for depression and suicidal ideas. Negative for hallucinations and substance abuse. The patient is nervous/anxious and has insomnia.   All other systems reviewed and are negative.   Blood pressure (!) 149/73, pulse 71, temperature 97.8 F (36.6 C), temperature source Oral, resp. rate (!) 23, height 5' (1.524 m), weight 80 lb (36.3 kg), SpO2 100 %.Body mass index is 15.62 kg/m.  General Appearance: Fairly Groomed  Eye Contact:  Good  Speech:  Clear and Coherent  Volume:  Normal  Mood:  sad  Affect:  Restricted and Tearful  Thought Process:  Coherent and Goal Directed  Orientation:  Full (Time, Place, and Person)  Thought Content:  Obsessions and Rumination Perceptions: denies AH/VH  Suicidal Thoughts:  Yes.  with intent/plan  Homicidal Thoughts:  No  Memory:  Immediate;   Good Recent;   Good Remote;   Good  Judgement:  Impaired  Insight:  Present  Psychomotor Activity:  Decreased  Concentration:  Concentration: Good and Attention Span: Good  Recall:  Good  Fund of Knowledge:  Good  Language:  Fair  Akathisia:  No  Handed:  Right  AIMS (if indicated):     Assets:  Communication Skills Desire for Improvement  ADL's:  Intact  Cognition:  WNL  Sleep:   poor   Assessment Lauren Bolton is a 55 y.o. female with history of depression, anxiety, hypothyroidism status post goiter removal, iron deficiency anemia presented with intractable nausea and vomiting. Psychiatry is consulted for depression, anxiety and to  find out any psychological components which can be contributed to her intractable nausea and vomiting.   # Adjustment disorder with depressed mood and anxiety # r/o MDD # r/o OCD Exam is notable for her rumination on GI symptoms. Patient also endorses neurovegetative symptoms including SI in the setting of intractable nausea and vomiting. Patient and her family denies any history of eating disorder and cannot think of any triggers to cause her symptoms. Although it is difficult to discern whether her current GI symptoms are secondary to her neurovegetative symptoms, she will greatly benefit from starting psychotropics given its severity. Will start mirtazapine for her mood, insomnia and this medication has less side effect for nausea. May consider trying olanzapine in the future to target her mood and nausea if she has less response to mirtazapine. Would recommend lorazepam prn to avoid risk of dependence.   Noted that patient reports active SI on today's evaluation; will recommend having a sitter placed. Will assess tomorrow again and would consider referral to inpatient psychiatry as needed if she is medically stable.   Plan - Start mirtazapine 7.5 mg qhs - Discontinue Xanax - Start lorzepam 1 mg TID prn for anxiety - Place a sitter for safety precaution.   Treatment Plan Summary: Daily contact with patient to assess and evaluate symptoms and progress in treatment  Disposition: as above  Norman Clay, MD 12/26/2015 6:39 PM

## 2015-12-26 NOTE — Anesthesia Postprocedure Evaluation (Signed)
Anesthesia Post Note  Patient: Lauren Bolton  Procedure(s) Performed: Procedure(s) (LRB): ESOPHAGOGASTRODUODENOSCOPY (EGD) WITH PROPOFOL (N/A)  Patient location during evaluation: Endoscopy Anesthesia Type: MAC Level of consciousness: awake and alert Pain management: pain level controlled Vital Signs Assessment: post-procedure vital signs reviewed and stable Respiratory status: spontaneous breathing, nonlabored ventilation, respiratory function stable and patient connected to nasal cannula oxygen Cardiovascular status: stable and blood pressure returned to baseline Anesthetic complications: no       Last Vitals:  Vitals:   12/26/15 0955 12/26/15 1007  BP: 131/70 137/68  Pulse: 63 (!) 55  Resp: (!) 23   Temp:  36.4 C    Last Pain:  Vitals:   12/26/15 1007  TempSrc: Oral  PainSc:                  [name]

## 2015-12-26 NOTE — Progress Notes (Signed)
PROGRESS NOTE    Lauren Bolton  ZDG:[id number] DOB: Oct 15, 1960 DOA: 12/23/2015 PCP: Julian HySOUTH,STEPHEN ALAN, MD   Chief Complaint  Patient presents with  . Nausea     Brief Narrative:  55 year old female with a history of hypothyroidism status post goiter removal, iron deficiency anemia, anxiety presented with intractable nausea and vomiting. Her symptoms began approximately 6 months ago initially with nausea and decreased food intake and has gradually progressed to the point where she is unable to consume any solid food without emesis except for rice and water.  She was admitted from 11/10/2015 through 11/14/2015 with similar symptoms. CT of the abdomen and pelvis on 11/11/2015 was negative for any acute findings. She underwent EGD performed by Dr. Madilyn FiremanHayes on 11/13/15 which was normal. She indicates that she is not hungry, and that when she does try to eat, she immediately has to vomit thefood back up. During this period of time, she has lost about 20 pounds.  Upon admission, the patient was noted to be hypokalemic with potassium 2.8. This was repleted, and [facility] was consulted to assist with management.  Assessment & Plan   Intractable nausea and vomiting -Gastroenterology consulted and appreciated, recommended gastric emptying study which was unremarkable.  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 Patient may need EGD with biopsy.  -There is concern for psychophysiologic conditions although small bowel infiltrative disease, esophageal dysmotility, or atypical biliary tract disease cannot be ruled out -RUQ ultrasound neg -12/23/2015 CT angiogram abdomen and pelvis--no bowel wall thickening, no acute vascular findings -Continue IV fluids -Cortrosyn stimulation test-->appropriate response -HIV negative -Urine drug screen positive for benzodiazepines -LFTs and lipase essentially unremarkable -Urine pregnancy test negative -TSH 0.366 -UGI series on 12/20 was normal -S/p EGD with biopsy.  EGD showed normal esophagus, stomach, duodenum. -Psychiatry consulted and appreciated -Continue PPI  Hypokalemia -Resolved, likely secondary to GI losses/poor intake -Mag-1.9 -Continue to monitor BMP  Microcytic anemia -hemoglobin currently 8.3, drop possibly secondary to IVF  -Continue to monitor CBC  Anxiety -continue alprazolam -pt already sees a psychologist in outpt setting- may also need psychiatry referral  DVT Prophylaxis  Lovenox  Code Status: Full  Family Communication: Husband at bedside  Disposition Plan: Currently in observation. Home when stable  Consultants [facility]  Procedures  Gastric emptying study  Antibiotics   Anti-infectives    None      Subjective:   Lauren Bolton seen and examined today. Complains of heart burn and pain in the epigastric pain. She has some nausea and has been able to drink some liquids. Denies chest pain, shortness of breath, dizziness, headache. Husband is frustrated as no one can seem to figure out what the cause of her problems are.  Objective:   Vitals:   12/26/15 0933 12/26/15 0945 12/26/15 0955 12/26/15 1007  BP: 140/71 (!) 153/72 131/70 137/68  Pulse: 68 (!) 58 63 (!) 55  Resp: 20 (!) 25 (!) 23   Temp: 97.8 F (36.6 C)   97.6 F (36.4 C)  TempSrc: Oral   Oral  SpO2: 100% 100% 100% 100%  Weight:      Height:        Intake/Output Summary (Last 24 hours) at 12/26/15 1402 Last data filed at 12/26/15 0930  Gross per 24 hour  Intake          3435.83 ml  Output                0 ml  Net  3435.83 ml   Filed Weights   12/23/15 1152 12/23/15 1955  Weight: 36.7 kg (81 lb) 36.3 kg (80 lb)    Exam  General: Well developed, well nourished, NAD, appears stated age  HEENT: NCAT,  mucous membranes moist.   Cardiovascular: S1 S2 auscultated, RRR, no murmurs  Respiratory: Clear to auscultation bilaterally with equal chest rise  Abdomen: Soft, nontender, nondistended, + bowel sounds  Extremities: warm  dry without cyanosis clubbing or edema  Neuro: AAOx3, nonfocal  Psych: Normal affect and demeanor   Data Reviewed: I have personally reviewed following labs and imaging studies  CBC:  Recent Labs Lab 12/23/15 1255 12/24/15 0727 12/26/15 0413  WBC 3.9* 3.5* 9.4  NEUTROABS 2.3  --   --   HGB 11.2* 9.4* 8.3*  HCT 32.7* 28.6* 25.6*  MCV 74.1* 74.7* 75.5*  PLT 239 198 182   Basic Metabolic Panel:  Recent Labs Lab 12/23/15 1255 12/23/15 1940 12/24/15 0727 12/25/15 0350 12/26/15 0413  NA 139  --  139 139 140  K 2.8*  --  4.4 4.4 3.9  CL 101  --  111 111 113*  CO2 28  --  23 25 23   GLUCOSE 90  --  102* 150* 128*  BUN <5*  --  <5* <5* 7  CREATININE 0.82  --  0.72 0.71 0.70  CALCIUM 9.3  --  8.8* 9.0 8.6*  MG  --  1.9  --   --   --    GFR: Estimated Creatinine Clearance: 45.5 mL/min (by C-G formula based on SCr of 0.7 mg/dL). Liver Function Tests:  Recent Labs Lab 12/23/15 1255 12/25/15 0350  AST 36 23  ALT 35 22  ALKPHOS 58 43  BILITOT 0.9 0.4  PROT 7.7 5.6*  ALBUMIN 4.2 2.9*    Recent Labs Lab 12/23/15 1255  LIPASE 21   No results for input(s): AMMONIA in the last 168 hours. Coagulation Profile: No results for input(s): INR, PROTIME in the last 168 hours. Cardiac Enzymes: No results for input(s): CKTOTAL, CKMB, CKMBINDEX, TROPONINI in the last 168 hours. BNP (last 3 results) No results for input(s): PROBNP in the last 8760 hours. HbA1C: No results for input(s): HGBA1C in the last 72 hours. CBG: No results for input(s): GLUCAP in the last 168 hours. Lipid Profile: No results for input(s): CHOL, HDL, LDLCALC, TRIG, CHOLHDL, LDLDIRECT in the last 72 hours. Thyroid Function Tests:  Recent Labs  12/23/15 1940  TSH 0.366   Anemia Panel: No results for input(s): VITAMINB12, FOLATE, FERRITIN, TIBC, IRON, RETICCTPCT in the last 72 hours. Urine analysis:    Component Value Date/Time   COLORURINE COLORLESS (A) 12/23/2015 1249   APPEARANCEUR CLEAR  12/23/2015 1249   LABSPEC 1.002 (L) 12/23/2015 1249   PHURINE 8.0 12/23/2015 1249   GLUCOSEU NEGATIVE 12/23/2015 1249   HGBUR NEGATIVE 12/23/2015 1249   BILIRUBINUR NEGATIVE 12/23/2015 1249   KETONESUR NEGATIVE 12/23/2015 1249   PROTEINUR NEGATIVE 12/23/2015 1249   UROBILINOGEN 0.2 03/17/2010 1421   NITRITE NEGATIVE 12/23/2015 1249   LEUKOCYTESUR TRACE (A) 12/23/2015 1249   Sepsis Labs: @LABRCNTIP (procalcitonin:4,lacticidven:4)  )No results found for this or any previous visit (from the past 240 hour(s)).    Radiology Studies: Dg Ugi  W/kub  Result Date: 12/25/2015 CLINICAL DATA:  Nausea and vomiting over the past few months. Weight loss. EXAM: UPPER GI SERIES WITH KUB TECHNIQUE: After obtaining a scout radiograph a routine upper GI series was performed using thin barium. FLUOROSCOPY TIME:  Fluoroscopy Time:  1 minutes, 46 seconds Radiation Exposure Index (if provided by the fluoroscopic device): Number of Acquired Spot Images: 2 COMPARISON:  Multiple exams, including 12/23/2015. FINDINGS: Initial KUB appears unremarkable. The patient was tremulous and nauseated and dizzy, and accordingly I was not able to stand her up for the exam or perform the full double contrast assessment. A single contrast assessment was performed. The pharyngeal phase of swallowing was not assessed. Clips in the neck from prior thyroidectomy. Primary peristaltic waves in the esophagus were normal on 4 out of 4 swallows. No esophageal ulceration or stricture. A 13 mm barium tablet passed without difficulty into the stomach. No distal esophageal mucosal fold thickening or significant hiatal hernia was observed. Normal duodenal configuration. Mild accentuation of rugal folds along the greater curvature ascribed to nondistention. No filling defect in the stomach at balloon compression. Duodenal bulb, stomach antrum, and proximal duodenum unremarkable. IMPRESSION: 1. No specific abnormality to account for the patient's  chronic nausea. Electronically Signed   By: Gaylyn RongWalter  Liebkemann M.D.   On: 12/25/2015 09:26     Scheduled Meds: . enoxaparin (LOVENOX) injection  30 mg Subcutaneous Q24H  . feeding supplement (PRO-STAT SUGAR FREE 64)  30 mL Oral TID BM  . levothyroxine  100 mcg Oral QAC breakfast  . pantoprazole  80 mg Oral Daily   Continuous Infusions: . dextrose 5 % and 0.9 % NaCl with KCl 40 mEq/L 125 mL/hr at 12/26/15 1019     LOS: 1 day   Time Spent in minutes   30 minutes  Roye Gustafson D.O. on 12/26/2015 at 2:02 PM  Between 7am to 7pm - Pager - [phone]  After 7pm go to [email or web address] - password [email or web address]  And look for the night coverage person covering for me after hours  [facility] Office  [phone]

## 2015-12-26 NOTE — Progress Notes (Signed)
0800 Pt  Was NPO post midnight for EGD today. To Endo via wheelchair.

## 2015-12-26 NOTE — Progress Notes (Signed)
EGD unrevealing; bx's will hopefully be ready tomorrow.  Would recomm Psych eval for possible contribution of psych problems to pt's GI sx, for which (so far, despite extensive w/u) we can find no organic pathology to explain them.  Further in favor of a possible psych etiol is (1) the fact that the pt has a known dx of anxiety; and (2) her GI sx do not quite fit into any obvious or known GI disease process, and objective testing (eg, gastric emptying, esoph motility on BaS) does not demonstrate an abnormality to correlate with them.  Lauren Bolton, M.D. Pager [phone] If no answer or after 5 PM call [phone]

## 2015-12-26 NOTE — Progress Notes (Signed)
Nutrition Follow-up  DOCUMENTATION CODES:   Severe malnutrition in context of acute illness/injury, Underweight  INTERVENTION:   -Continue 30 ml Prostat TID, each supplement provides 100 kcals and 15 grams protein -Continue MVI daily   NUTRITION DIAGNOSIS:   Malnutrition related to acute illness as evidenced by percent weight loss, energy intake < or equal to 75% for > or equal to 1 month, moderate depletions of muscle mass.  Ongoing  GOAL:   Patient will meet greater than or equal to 90% of their needs  Progressing  MONITOR:   PO intake, Supplement acceptance, Labs, Weight trends, Skin, I & O's  REASON FOR ASSESSMENT:   Malnutrition Screening Tool    ASSESSMENT:   Lauren Bolton is a 55 y.o. female with medical history significant of hypothyroid, Fe def anemia.  She was in normal health until June 2017 when she went to the ER with weakness--  Appeared anxious and depressed per ER doc.  CT scan of brain was done then that was normal.  She was sent to her PCP for follow up.  She per husband, was able to keep working until August.  At that point she was only able to consume rice water and was very weak.  Husband reports 2 more ER visits in October for nausea and vomiting.  The end of October to November she was hospitalized for N/V and inability to eat.  She had EGD done then with normal esophagus/stomach.    Pt just returned from endoscopy suite at visit. Currently NPO.   Per MAR, pt is accepting Prostat supplements. RD will continue upon resumption of diet.   Pt underwent upper endoscopy. Per report, normal impression, however, stomach and duodenum were biopsied. Per GI notes, hopeful for biopsies to read tomorrow. GI continues to investigate organic causes for symptoms, however, suspicion that anxiety may be playing a role and is recommending psych evaluation.   Labs reviewed.   Diet Order:  Diet bariatric full liquid Room service appropriate? Yes; Fluid consistency:  Thin  Skin:  Reviewed, no issues  Last BM:  12/25/15  Height:   Ht Readings from Last 1 Encounters:  12/23/15 5' (1.524 m)    Weight:   Wt Readings from Last 1 Encounters:  12/23/15 80 lb (36.3 kg)    Ideal Body Weight:  45.5 kg  BMI:  Body mass index is 15.62 kg/m.  Estimated Nutritional Needs:   Kcal:  1100-1300  Protein:  55-70 grams  Fluid:  >1.1 L  EDUCATION NEEDS:   No education needs identified at this time  Lauren Bolton A. Mayford KnifeWilliams, RD, LDN, CDE Pager: [phone] After hours Pager: [phone]

## 2015-12-26 NOTE — Transfer of Care (Signed)
Immediate Anesthesia Transfer of Care Note  Patient: Lauren Bolton  Procedure(s) Performed: Procedure(s): ESOPHAGOGASTRODUODENOSCOPY (EGD) WITH PROPOFOL (N/A)  Patient Location: Endoscopy Unit  Anesthesia Type:MAC  Level of Consciousness: responds to stimulation  Airway & Oxygen Therapy: Patient Spontanous Breathing  Post-op Assessment: Report given to RN and Post -op Vital signs reviewed and stable  Post vital signs: Reviewed and stable  Last Vitals:  Vitals:   12/26/15 0600 12/26/15 0824  BP: 116/74 (!) 150/78  Pulse: 75 71  Resp:  (!) 23  Temp: 36.6 C 37.1 C    Last Pain:  Vitals:   12/26/15 0824  TempSrc: Oral  PainSc:          Complications: No apparent anesthesia complications

## 2015-12-27 ENCOUNTER — Encounter (HOSPITAL_COMMUNITY): Payer: Self-pay | Admitting: Gastroenterology

## 2015-12-27 DIAGNOSIS — F4323 Adjustment disorder with mixed anxiety and depressed mood: Secondary | ICD-10-CM

## 2015-12-27 DIAGNOSIS — F418 Other specified anxiety disorders: Secondary | ICD-10-CM

## 2015-12-27 DIAGNOSIS — D649 Anemia, unspecified: Secondary | ICD-10-CM

## 2015-12-27 LAB — BASIC METABOLIC PANEL
Anion gap: 6 (ref 5–15)
BUN: 9 mg/dL (ref 6–20)
CHLORIDE: 108 mmol/L (ref 101–111)
CO2: 27 mmol/L (ref 22–32)
CREATININE: 0.73 mg/dL (ref 0.44–1.00)
Calcium: 8.9 mg/dL (ref 8.9–10.3)
GFR calc Af Amer: >60 mL/min (ref >60)
GFR calc non Af Amer: >60 mL/min (ref >60)
GLUCOSE: 85 mg/dL (ref 65–99)
POTASSIUM: 3.6 mmol/L (ref 3.5–5.1)
SODIUM: 141 mmol/L (ref 135–145)

## 2015-12-27 LAB — CBC
HCT: 25.7 % — ABNORMAL LOW (ref 36.0–46.0)
HEMOGLOBIN: 8.4 g/dL — AB (ref 12.0–15.0)
MCH: 24.5 pg — AB (ref 26.0–34.0)
MCHC: 32.7 g/dL (ref 30.0–36.0)
MCV: 74.9 fL — AB (ref 78.0–100.0)
PLATELETS: 173 10*3/uL (ref 150–400)
RBC: 3.43 MIL/uL — ABNORMAL LOW (ref 3.87–5.11)
RDW: 14.2 % (ref 11.5–15.5)
WBC: 7.4 10*3/uL (ref 4.0–10.5)

## 2015-12-27 LAB — MISC LABCORP TEST (SEND OUT): LABCORP TEST CODE: 120980

## 2015-12-27 MED ORDER — LORAZEPAM 1 MG PO TABS
1.0000 mg | ORAL_TABLET | Freq: Three times a day (TID) | ORAL | Status: DC | PRN
  Administered 2015-12-27: 1 mg via ORAL
  Filled 2015-12-27: qty 1

## 2015-12-27 MED ORDER — ALPRAZOLAM 0.25 MG PO TABS
0.2500 mg | ORAL_TABLET | Freq: Three times a day (TID) | ORAL | Status: DC
  Administered 2015-12-27 – 2015-12-28 (×3): 0.25 mg via ORAL
  Filled 2015-12-27 (×3): qty 1

## 2015-12-27 NOTE — Progress Notes (Signed)
PROGRESS NOTE    Lauren Bolton  ZOX:[id number] DOB: [birth year] DOA: 12/23/2015 PCP: Julian HySOUTH,STEPHEN ALAN, MD   Chief Complaint  Patient presents with  . Nausea     Brief Narrative:  55 year old female with a history of hypothyroidism status post goiter removal, iron deficiency anemia, anxiety presented with intractable nausea and vomiting. Her symptoms began approximately 6 months ago initially with nausea and decreased food intake and has gradually progressed to the point where she is unable to consume any solid food without emesis except for rice and water.  She was admitted from 11/10/2015 through 11/14/2015 with similar symptoms. CT of the abdomen and pelvis on 11/11/2015 was negative for any acute findings. She underwent EGD performed by Dr. Madilyn FiremanHayes on 11/13/15 which was normal. She indicates that she is not hungry, and that when she does try to eat, she immediately has to vomit thefood back up. During this period of time, she has lost about 20 pounds.  Upon admission, the patient was noted to be hypokalemic with potassium 2.8. This was repleted, and [facility] was consulted to assist with management.  Assessment & Plan   Intractable nausea and vomiting -Gastroenterology consulted and appreciated, recommended gastric emptying study which was unremarkable.  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 Patient may need EGD with biopsy.  -There is concern for psychophysiologic conditions although small bowel infiltrative disease, esophageal dysmotility, or atypical biliary tract disease cannot be ruled out -RUQ ultrasound neg -12/23/2015 CT angiogram abdomen and pelvis--no bowel wall thickening, no acute vascular findings -Cortrosyn stimulation test-->appropriate response -HIV negative -Urine drug screen positive for benzodiazepines -LFTs and lipase essentially unremarkable -Urine pregnancy test negative -TSH 0.366 -UGI series on 12/20 was normal -S/p EGD with biopsy. EGD showed normal  esophagus, stomach, duodenum. -Psychiatry consulted and appreciated -Continue PPI- per GI would taper off over the next 2 weeks, taking it every other day  Hypokalemia -Resolved, likely secondary to GI losses/poor intake -Mag-1.9 -Continue to monitor BMP  Microcytic anemia -hemoglobin currently 8.4, drop possibly secondary to IVF  -Continue to monitor CBC  Anxiety/depression -pt already sees a psychologist in outpt setting- may also need psychiatry referral -psychiatry consulted and appreciated, recommended Remeron 7.5mg  QHS, Ativan 1mg  TID PRN -patient very apprehensive about taking 1mg  dose, will order Ativan 0.25mg  TID PRN -Had lengthy conversation with patient and husband regarding trying these medications.  -I feel that patient is very depressed and has anxiety.  When we spoke about possible discharge today, she became tearful and fearful.   -Pending further recommendations from psychiatry.  There was mention of possible inpt admission. -At this time, patient is medically stable.   DVT Prophylaxis  Lovenox  Code Status: Full  Family Communication: Husband at bedside  Disposition Plan: Currently in observation. Home when stable  Consultants [facility]  Procedures  Gastric emptying study  Antibiotics   Anti-infectives    None      Subjective:   Lauren Bolton seen and examined today. Fearful of taking new medications.  Denies chest pain, shortness of breath, dizziness, headache.   Objective:   Vitals:   12/26/15 1007 12/26/15 1700 12/26/15 2127 12/27/15 0602  BP: 137/68 (!) 149/73 (!) 157/84 128/77  Pulse: (!) 55 71 68 (!) 58  Resp:    18  Temp: 97.6 F (36.4 C) 97.8 F (36.6 C) 98.4 F (36.9 C) 98.8 F (37.1 C)  TempSrc: Oral Oral Oral Oral  SpO2: 100% 100% 100% 99%  Weight:      Height:  Intake/Output Summary (Last 24 hours) at 12/27/15 1444 Last data filed at 12/27/15 0900  Gross per 24 hour  Intake              300 ml  Output                 0 ml  Net              300 ml   Filed Weights   12/23/15 1152 12/23/15 1955  Weight: 36.7 kg (81 lb) 36.3 kg (80 lb)    Exam  General: Well developed, well nourished, NAD, appears stated age  HEENT: NCAT,  mucous membranes moist.   Cardiovascular: S1 S2 auscultated, RRR, no murmurs  Respiratory: Clear to auscultation bilaterally with equal chest rise  Abdomen: Soft, nontender, nondistended, + bowel sounds  Extremities: warm dry without cyanosis clubbing or edema  Neuro: AAOx3, nonfocal  Psych: Anxious, tearful.   Data Reviewed: I have personally reviewed following labs and imaging studies  CBC:  Recent Labs Lab 12/23/15 1255 12/24/15 0727 12/26/15 0413 12/27/15 0411  WBC 3.9* 3.5* 9.4 7.4  NEUTROABS 2.3  --   --   --   HGB 11.2* 9.4* 8.3* 8.4*  HCT 32.7* 28.6* 25.6* 25.7*  MCV 74.1* 74.7* 75.5* 74.9*  PLT 239 198 182 173   Basic Metabolic Panel:  Recent Labs Lab 12/23/15 1255 12/23/15 1940 12/24/15 0727 12/25/15 0350 12/26/15 0413 12/27/15 0411  NA 139  --  139 139 140 141  K 2.8*  --  4.4 4.4 3.9 3.6  CL 101  --  111 111 113* 108  CO2 28  --  23 25 23 27   GLUCOSE 90  --  102* 150* 128* 85  BUN <5*  --  <5* <5* 7 9  CREATININE 0.82  --  0.72 0.71 0.70 0.73  CALCIUM 9.3  --  8.8* 9.0 8.6* 8.9  MG  --  1.9  --   --   --   --    GFR: Estimated Creatinine Clearance: 45.5 mL/min (by C-G formula based on SCr of 0.73 mg/dL). Liver Function Tests:  Recent Labs Lab 12/23/15 1255 12/25/15 0350  AST 36 23  ALT 35 22  ALKPHOS 58 43  BILITOT 0.9 0.4  PROT 7.7 5.6*  ALBUMIN 4.2 2.9*    Recent Labs Lab 12/23/15 1255  LIPASE 21   No results for input(s): AMMONIA in the last 168 hours. Coagulation Profile: No results for input(s): INR, PROTIME in the last 168 hours. Cardiac Enzymes: No results for input(s): CKTOTAL, CKMB, CKMBINDEX, TROPONINI in the last 168 hours. BNP (last 3 results) No results for input(s): PROBNP in the last 8760  hours. HbA1C: No results for input(s): HGBA1C in the last 72 hours. CBG: No results for input(s): GLUCAP in the last 168 hours. Lipid Profile: No results for input(s): CHOL, HDL, LDLCALC, TRIG, CHOLHDL, LDLDIRECT in the last 72 hours. Thyroid Function Tests: No results for input(s): TSH, T4TOTAL, FREET4, T3FREE, THYROIDAB in the last 72 hours. Anemia Panel: No results for input(s): VITAMINB12, FOLATE, FERRITIN, TIBC, IRON, RETICCTPCT in the last 72 hours. Urine analysis:    Component Value Date/Time   COLORURINE COLORLESS (A) 12/23/2015 1249   APPEARANCEUR CLEAR 12/23/2015 1249   LABSPEC 1.002 (L) 12/23/2015 1249   PHURINE 8.0 12/23/2015 1249   GLUCOSEU NEGATIVE 12/23/2015 1249   HGBUR NEGATIVE 12/23/2015 1249   BILIRUBINUR NEGATIVE 12/23/2015 1249   KETONESUR NEGATIVE 12/23/2015 1249   PROTEINUR NEGATIVE 12/23/2015  1249   UROBILINOGEN 0.2 03/17/2010 1421   NITRITE NEGATIVE 12/23/2015 1249   LEUKOCYTESUR TRACE (A) 12/23/2015 1249   Sepsis Labs: @LABRCNTIP (procalcitonin:4,lacticidven:4)  )No results found for this or any previous visit (from the past 240 hour(s)).    Radiology Studies: No results found.   Scheduled Meds: . ALPRAZolam  0.25 mg Oral TID  . enoxaparin (LOVENOX) injection  30 mg Subcutaneous Q24H  . feeding supplement (PRO-STAT SUGAR FREE 64)  30 mL Oral TID BM  . levothyroxine  100 mcg Oral QAC breakfast  . mirtazapine  7.5 mg Oral QHS  . pantoprazole  80 mg Oral Daily   Continuous Infusions:    LOS: 2 days   Time Spent in minutes   30 minutes  Reshawn Ostlund D.O. on 12/27/2015 at 2:44 PM  Between 7am to 7pm - Pager - [phone]  After 7pm go to [email or web address] - password [email or web address]  And look for the night coverage person covering for me after hours  [facility] Office  [phone]

## 2015-12-27 NOTE — Progress Notes (Signed)
At this time, the patient is doing better with respect to her vomiting and burping. Looking at her tray, it appears she is eating about 30-50% of her meals. Although it is difficult to get a clear history from this rather withdrawn and patient, I get the impression that her oral intake, and food tolerance, are better than they were at the time of admission.  Her endoscopic biopsies from yesterday are unrevealing for any source of her symptoms. The small bowel biopsies were completely normal, whereas the gastric biopsies did show some chronic gastritis with focal intestinal metaplasia, without evidence of Helicobacter pylori infection.   Given her origin from [place] where there is a high incidence of gastric cancer, given this finding of intestinal metaplasia, which might slightly increase her risk of getting gastric cancer, I will put her on a follow-up list for repeat endoscopy about 2 years from now, at which time I would anticipate obtaining more extensive gastric biopsies for the purpose of "gastric mapping".  Meanwhile, from the GI perspective, I don't think any further workup is needed. I would recommend the following:  1. I explained to the patient and her husband that, in my opinion, it is okay for the patient to try tapering off her PPI therapy (which, at home, is omeprazole). I specifically recommended that, following discharge, the patient take it every other day for 2 weeks,  before stopping it (so as to avoid potential "acid rebound"). I doubt that this medication is giving her any symptomatic relief, but I advised them to resume use of the medicine if she notices a change for the worse after stopping it.  2. I would consider calorie counts prior to discharge, to make sure that there is reasonable intake.  3. This patient will need careful monitoring and follow-up through her primary physician's office following discharge. I don't think that further GI evaluation or follow-up  will be needed, except upon request from the patient's attending physician.  4. I continue to feel that this patient is likely suffering from psychologic/psychiatric/emotional factors affecting her appetite and GI symptoms. Therefore, I would await psychiatric evaluation, which has already been requested, according to my understanding from discussion with the attending hospitalist physician yesterday. As far as I can tell, this patient does not have a demonstrable organic GI disease to account for her symptoms.  5. Possible repeat endoscopy in 2 years to monitor intestinal metaplasia, as described above.  6. We will sign off. Please call us if questions, or if we can be of further assistance.  Florencia Reasonsobert V. Eve Rey, M.D. Pager [phone] If no answer or after 5 PM call [phone]

## 2015-12-27 NOTE — Consult Note (Addendum)
[facility] Face-to-Face Psychiatry Consult   Reason for Consult:   Referring Physician:  Dr. Ree Kida Patient Identification: Lauren Bolton MRN:  [id number] Principal Diagnosis: Adjustment disorder with mixed anxiety and depressed mood Diagnosis:   Patient Active Problem List   Diagnosis Date Noted  . Adjustment disorder with mixed anxiety and depressed mood [F43.23] 12/26/2015  . Malnutrition ([place]) [E46]   . Intractable vomiting [R11.10] 12/23/2015  . Hypokalemia [E87.6] 11/11/2015  . Iron deficiency anemia [D50.9] 11/11/2015  . Malaise and fatigue [R53.81, R53.83] 11/11/2015  . Failure to thrive in adult [R62.7] 11/11/2015  . Protein-calorie malnutrition, severe [E43] 11/11/2015  . Nausea with vomiting [R11.2] 11/11/2015  . Anxiety state [F41.1]   . Decreased oral intake [R63.8]   . Hypothyroidism [E03.9] 11/10/2015    Total Time spent with patient: 45 minutes  Subjective/HPI:   Lauren Bolton is a 55 y.o. female with history of depression, anxiety, hypothyroidism status post goiter removal, iron deficiency anemia presented with intractable nausea and vomiting. Psychiatry is consulted for depression, anxiety and to find out any psychological components which can be contributed to her intractable nausea and vomiting.   Patient is offered to use telephone interpreter; she declines it and prefers to speak in [place].  Patient states that she is still unable to eat (more than half tray was empty this afternoon). She feels overwhelmed and has occasional "thoughts" of SI. She denies any plan or intention. She does not know what to do about her nausea/vomiting, although her husband and her child is very concerned about her. She feels "scared" to be in the hospital. On other times, she states that it is "too [place]" to wait for outpatient follow up appointment. She prefers to go home rather than coming to psychiatry hospital. She agrees to take mirtazapine.   Past Psychiatric History:  Outpatient:  denies (but reports seeing some for talking in the past) Psychiatry admission: denies Previous suicide attempt: denies Past trials of medication: sertraline (cause vomiting) History of violence: denies  Risk to Self: Is patient at risk for suicide?: No Risk to Others:   Prior Inpatient Therapy:   Prior Outpatient Therapy:    Past Medical History:  Past Medical History:  Diagnosis Date  . Iron deficiency anemia   . Thyroid disease     Past Surgical History:  Procedure Laterality Date  . ESOPHAGOGASTRODUODENOSCOPY N/A 11/13/2015   Procedure: ESOPHAGOGASTRODUODENOSCOPY (EGD);  Surgeon: Teena Irani, MD;  Location: [facility];  Service: Endoscopy;  Laterality: N/A;  . ESOPHAGOGASTRODUODENOSCOPY (EGD) WITH PROPOFOL N/A 12/26/2015   Procedure: ESOPHAGOGASTRODUODENOSCOPY (EGD) WITH PROPOFOL;  Surgeon: Ronald Lobo, MD;  Location: [facility] ENDOSCOPY;  Service: Endoscopy;  Laterality: N/A;  . PLACEMENT OF BREAST IMPLANTS     20 years ago  . thyroid goiter     Family History: History reviewed. No pertinent family history. Family Psychiatric  History: denies Social History:  History  Alcohol Use No     History  Drug Use No    Social History   Social History  . Marital status: Married    Spouse name: N/A  . Number of children: N/A  . Years of education: N/A   Social History Main Topics  . Smoking status: Never Smoker  . Smokeless tobacco: Never Used  . Alcohol use No  . Drug use: No  . Sexual activity: Not Asked   Other Topics Concern  . None   Social History Narrative  . None   Additional Social History:  Lives with her husband, and one  child. She has two children She used to work at [employer] before her GI symptoms started.  Moved from [place] in 1983 with her family.   Allergies:   Allergies  Allergen Reactions  . Benadryl [Diphenhydramine Hcl] Diarrhea and Other (See Comments)    Reaction:  Makes her excessively sleepy     Labs:  Results for orders placed  or performed during the hospital encounter of 12/23/15 (from the past 48 hour(s))  CBC     Status: Abnormal   Collection Time: 12/26/15  4:13 AM  Result Value Ref Range   WBC 9.4 4.0 - 10.5 K/uL   RBC 3.39 (L) 3.87 - 5.11 MIL/uL   Hemoglobin 8.3 (L) 12.0 - 15.0 g/dL   HCT 25.6 (L) 36.0 - 46.0 %   MCV 75.5 (L) 78.0 - 100.0 fL   MCH 24.5 (L) 26.0 - 34.0 pg   MCHC 32.4 30.0 - 36.0 g/dL   RDW 14.1 11.5 - 15.5 %   Platelets 182 150 - 400 K/uL  Basic metabolic panel     Status: Abnormal   Collection Time: 12/26/15  4:13 AM  Result Value Ref Range   Sodium 140 135 - 145 mmol/L   Potassium 3.9 3.5 - 5.1 mmol/L   Chloride 113 (H) 101 - 111 mmol/L   CO2 23 22 - 32 mmol/L   Glucose, Bld 128 (H) 65 - 99 mg/dL   BUN 7 6 - 20 mg/dL   Creatinine, Ser 0.70 0.44 - 1.00 mg/dL   Calcium 8.6 (L) 8.9 - 10.3 mg/dL   GFR calc non Af Amer >60 >60 mL/min   GFR calc Af Amer >60 >60 mL/min    Comment: (NOTE) The eGFR has been calculated using the CKD EPI equation. This calculation has not been validated in all clinical situations. eGFR's persistently <60 mL/min signify possible Chronic Kidney Disease.    Anion gap 4 (L) 5 - 15  CBC     Status: Abnormal   Collection Time: 12/27/15  4:11 AM  Result Value Ref Range   WBC 7.4 4.0 - 10.5 K/uL   RBC 3.43 (L) 3.87 - 5.11 MIL/uL   Hemoglobin 8.4 (L) 12.0 - 15.0 g/dL   HCT 25.7 (L) 36.0 - 46.0 %   MCV 74.9 (L) 78.0 - 100.0 fL   MCH 24.5 (L) 26.0 - 34.0 pg   MCHC 32.7 30.0 - 36.0 g/dL   RDW 14.2 11.5 - 15.5 %   Platelets 173 150 - 400 K/uL  Basic metabolic panel     Status: None   Collection Time: 12/27/15  4:11 AM  Result Value Ref Range   Sodium 141 135 - 145 mmol/L   Potassium 3.6 3.5 - 5.1 mmol/L   Chloride 108 101 - 111 mmol/L   CO2 27 22 - 32 mmol/L   Glucose, Bld 85 65 - 99 mg/dL   BUN 9 6 - 20 mg/dL   Creatinine, Ser 0.73 0.44 - 1.00 mg/dL   Calcium 8.9 8.9 - 10.3 mg/dL   GFR calc non Af Amer >60 >60 mL/min   GFR calc Af Amer >60 >60  mL/min    Comment: (NOTE) The eGFR has been calculated using the CKD EPI equation. This calculation has not been validated in all clinical situations. eGFR's persistently <60 mL/min signify possible Chronic Kidney Disease.    Anion gap 6 5 - 15    Current Facility-Administered Medications  Medication Dose Route Frequency Provider Last Rate Last Dose  . ALPRAZolam (  XANAX) tablet 0.25 mg  0.25 mg Oral TID Maryann Mikhail, DO   0.25 mg at 12/27/15 1435  . enoxaparin (LOVENOX) injection 30 mg  30 mg Subcutaneous Q24H Donalynn Furlong Marshall, RPH   30 mg at 12/26/15 2026  . feeding supplement (PRO-STAT SUGAR FREE 64) liquid 30 mL  30 mL Oral TID BM Orson Eva, MD   30 mL at 12/27/15 1428  . levothyroxine (SYNTHROID, LEVOTHROID) tablet 100 mcg  100 mcg Oral QAC breakfast Geradine Girt, DO   100 mcg at 12/27/15 0750  . mirtazapine (REMERON) tablet 7.5 mg  7.5 mg Oral QHS Maryann Mikhail, DO      . ondansetron (ZOFRAN) tablet 4 mg  4 mg Oral Q6H PRN Geradine Girt, DO       Or  . ondansetron (ZOFRAN) injection 4 mg  4 mg Intravenous Q6H PRN Geradine Girt, DO      . pantoprazole (PROTONIX) EC tablet 80 mg  80 mg Oral Daily Jessica U Vann, DO   80 mg at 12/27/15 0900  . simethicone (MYLICON) chewable tablet 160 mg  160 mg Oral QID PRN Jeryl Columbia, NP   160 mg at 12/27/15 1432    Musculoskeletal: Strength & Muscle Tone: within normal limits Gait & Station: normal Patient leans: N/A  Psychiatric Specialty Exam: Physical Exam  Review of Systems  Gastrointestinal: Positive for nausea and vomiting.  Psychiatric/Behavioral: Positive for depression. Negative for hallucinations, substance abuse and suicidal ideas. The patient is nervous/anxious and has insomnia.   All other systems reviewed and are negative.   Blood pressure 128/77, pulse (!) 58, temperature 98.8 F (37.1 C), temperature source Oral, resp. rate 18, height 5' (1.524 m), weight 80 lb (36.3 kg), SpO2 99 %.Body mass index is 15.62  kg/m.  General Appearance: Fairly Groomed  Eye Contact:  Good  Speech:  Clear and Coherent  Volume:  Normal  Mood:  "same"  Affect:  Restricted and Tearful  Thought Process:  Coherent and Goal Directed  Orientation:  Full (Time, Place, and Person)  Thought Content:  Obsessions and Rumination Perceptions: denies AH/VH  Suicidal Thoughts:  No  Homicidal Thoughts:  No  Memory:  Immediate;   Good Recent;   Good Remote;   Good  Judgement:  Impaired  Insight:  Present  Psychomotor Activity:  Decreased  Concentration:  Concentration: Good and Attention Span: Good  Recall:  Good  Fund of Knowledge:  Good  Language:  Fair  Akathisia:  No  Handed:  Right  AIMS (if indicated):     Assets:  Communication Skills Desire for Improvement  ADL's:  Intact  Cognition:  WNL  Sleep:   poor   Assessment Iva Blanchett is a 55 y.o. female with history of depression, anxiety, hypothyroidism status post goiter removal, iron deficiency anemia presented with intractable nausea and vomiting. Psychiatry is consulted for depression, anxiety and to find out any psychological components which can be contributed to her intractable nausea and vomiting.   # Adjustment disorder with depressed mood and anxiety # r/o MDD # r/o OCD She continues to ruminate on her GI symptoms and endorse neurovegetative symptoms. Although she will benefit from psychiatry admission and it is offered, she declines this option and prefers to go home. Based on the risk assessment as below,  she is considered safe to be discharged. Although it is difficult to discern whether her GI symptoms are secondary to depression, there might be a component of neurovegetative symptoms playing some  role in her appetite loss. She will greatly benefit from psychiatry care; she agreed for outpatient follow up and start mirtazapine (which she declined last night). May consider trying olanzapine in the future to target her mood and nausea if she has less  response to mirtazapine.   Plan - Continue mirtazapine 7.5 mg qhs - (if she needs ativan prn, may consider 0.5 mg TID prn for anxiety, dispense until she can see her psychiatrist.) - Recommend psychiatry outpatient follow up   The patient demonstrates the following risk factors for suicide: Chronic risk factors for suicide include: psychiatric disorder of depression and medical illness intractable nausea. Acute risk factors for suicide include: social withdrawal/isolation. Protective factors for this patient include: positive social support and hope for the future. Considering these factors, the overall suicide risk at this point appears to be low. Patient is appropriate for outpatient follow up. Emergency resources which includes 911, ED, suicide crisis line [phone]) are discussed.    Treatment Plan Summary: Daily contact with patient to assess and evaluate symptoms and progress in treatment and Plan as above  Disposition: Patient does not meet criteria for psychiatric inpatient admission. as above  Norman Clay, MD 12/27/2015 4:02 PM

## 2015-12-28 MED ORDER — OMEPRAZOLE 40 MG PO CPDR: 40.0000 mg | DELAYED_RELEASE_CAPSULE | ORAL | 0 refills | Status: DC

## 2015-12-28 MED ORDER — MIRTAZAPINE 7.5 MG PO TABS: 7.5000 mg | ORAL_TABLET | Freq: Every day | ORAL | 0 refills | Status: DC

## 2015-12-28 MED ORDER — PRO-STAT SUGAR FREE PO LIQD: 30.0000 mL | Freq: Three times a day (TID) | ORAL | 0 refills | Status: DC

## 2015-12-28 MED ORDER — SIMETHICONE 80 MG PO CHEW: 160.0000 mg | CHEWABLE_TABLET | Freq: Four times a day (QID) | ORAL | 0 refills | Status: DC | PRN

## 2015-12-28 NOTE — Discharge Summary (Signed)
Physician Discharge Summary  Lauren Bolton WUJ:[id number] DOB: 11/18/1960 DOA: 12/23/2015  PCP: Julian Hy, MD  Admit date: 12/23/2015 Discharge date: 12/28/2015  Time spent: 45 minutes  Recommendations for Outpatient Follow-up:  Patient will be discharged to home.  Patient will need to follow up with primary care provider within one week of discharge, repeat CBC and BMP.  Follow up with your psychologist and psychiatrist. Patient should continue medications as prescribed.  Patient should follow a regular diet.   Discharge Diagnoses:  Intractable nausea and vomiting  Hypokalemia Microcytic anemia Anxiety/depression  Discharge Condition: Stable  Diet recommendation: Regular  Filed Weights   12/23/15 1152 12/23/15 1955  Weight: 36.7 kg (81 lb) 36.3 kg (80 lb)    History of present illness:  On 12/23/2015 by Dr. Marlin Canary [name] is a 55 y.o. female with medical history significant of hypothyroid, Fe def anemia.  She was in normal health until June 2017 when she went to the ER with weakness--  Appeared anxious and depressed per ER doc.  CT scan of brain was done then that was normal.  She was sent to her PCP for follow up.  She per husband, was able to keep working until August.  At that point she was only able to consume rice water and was very weak.  Husband reports 2 more ER visits in October for nausea and vomiting.  The end of October to November she was hospitalized for N/V and inability to eat.  She had EGD done then with normal esophagus/stomach.    In the ER today, she was found to have a low K and complaints of continued nausea and vomiting.  She reports being very anxious but no abdominal pain.  No pain or difficulty swallowing.  Just anytime patient eats/drinks anything, she vomits.  Patient has been in [place] since 1984.  Previously lived in [place] and [place].  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  Hospital Course:  Intractable nausea and  vomiting -Gastroenterology consulted and appreciated, recommended gastric emptying study which was unremarkable.  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 Patient may need EGD with biopsy.  -There is concern for psychophysiologic conditions although small bowel infiltrative disease, esophageal dysmotility, or atypical biliary tract disease cannot be ruled out -RUQ ultrasound neg -12/23/2015 CT angiogram abdomen and pelvis--no bowel wall thickening, no acute vascular findings -Cortrosyn stimulation test-->appropriate response -HIV negative -Urine drug screen positive for benzodiazepines -LFTs and lipase essentially unremarkable -Urine pregnancy test negative -TSH 0.366 -UGI series on 12/20 was normal -S/p EGD with biopsy. EGD showed normal esophagus, stomach, duodenum. -Biopsy results:  1. Duodenum, Biopsy - BENIGN DUODENAL MUCOSA. - NO FEATURES OF SPRUE, ACTIVE INFLAMMATION, GRANULOMAS OR OTHER ABNORMALITIES. 2. Stomach, biopsy - MILD CHRONIC GASTRITIS WITH FOCAL INTESTINAL METAPLASIA. - WARTHIN-STARRY STAIN NEGATIVE FOR HELICOBACTER PYLORI. - NO INTESTINAL METAPLASIA, DYSPLASIA, OR EVIDENCE OF MALIGNANCY. -Psychiatry consulted and appreciated -Continue PPI- per GI would taper off over the next 2 weeks, taking it every other day  Hypokalemia -Resolved, likely secondary to GI losses/poor intake -Mag-1.9 -Continue to monitor BMP  Microcytic anemia -hemoglobin currently 8.4, drop possibly secondary to IVF  -Repeat CBC in one week  Anxiety/depression -pt already sees a psychologist in outpt setting- may also need psychiatry referral -psychiatry consulted and appreciated, recommended Remeron 7.5mg  QHS, Ativan 1mg  TID PRN -patient very apprehensive about taking 1mg  dose, will order Ativan 0.25mg  TID PRN -Had lengthy conversation with patient and husband regarding trying these medications.  -I feel that patient is very depressed  and has anxiety.  When we spoke about possible  discharge today, she became tearful and fearful.   -Pending further recommendations from psychiatry.  There was mention of possible inpt admission. -At this time, patient is medically stable.   Severe malnutrition -Nutrition consulted -Continue supplements  Consultants [facility]  Procedures  Gastric emptying study EGD with biopsy  Discharge Exam: Vitals:   12/27/15 2100 12/28/15 0555  BP: 140/81 114/73  Pulse: 65 69  Resp: 18 18  Temp: 98.2 F (36.8 C) 98.5 F (36.9 C)   Patient was able to sleep last night.  No complaints of chest pain, shortness of breath, abdominal pain. Still worries about taking medications.   Exam  General: Well developed, well nourished, NAD, appears stated age  HEENT: NCAT,  mucous membranes moist.   Cardiovascular: S1 S2 auscultated, RRR, no murmurs  Respiratory: Clear to auscultation bilaterally with equal chest rise  Abdomen: Soft, nontender, nondistended, + bowel sounds  Extremities: warm dry without cyanosis clubbing or edema  Neuro: AAOx3, nonfocal  Psych: Appropriate  Discharge Instructions Discharge Instructions    Discharge instructions    Complete by:  As directed    Patient will be discharged to home.  Patient will need to follow up with primary care provider within one week of discharge, repeat CBC and BMP.  Follow up with your psychologist and psychiatrist. Patient should continue medications as prescribed.  Patient should follow a regular diet.     Current Discharge Medication List    START taking these medications   Details  Amino Acids-Protein Hydrolys (FEEDING SUPPLEMENT, PRO-STAT SUGAR FREE 64,) LIQD Take 30 mLs by mouth 3 (three) times daily between meals. Qty: 900 mL, Refills: 0    mirtazapine (REMERON) 7.5 MG tablet Take 1 tablet (7.5 mg total) by mouth at bedtime. Qty: 30 tablet, Refills: 0    simethicone (MYLICON) 80 MG chewable tablet Chew 2 tablets (160 mg total) by mouth 4 (four) times daily  as needed for flatulence. Qty: 60 tablet, Refills: 0      CONTINUE these medications which have CHANGED   Details  omeprazole (PRILOSEC) 40 MG capsule Take 1 capsule (40 mg total) by mouth every other day. Take every other day over the next 2 weeks, then stop Refills: 0      CONTINUE these medications which have NOT CHANGED   Details  ALPRAZolam (XANAX) 0.25 MG tablet Take 0.25 mg by mouth 3 (three) times daily as needed for anxiety.  Refills: 0    calcium carbonate (TUMS - DOSED IN MG ELEMENTAL CALCIUM) 500 MG chewable tablet Chew 1 tablet by mouth as needed for indigestion or heartburn.    iron polysaccharides (NIFEREX) 150 MG capsule Take 150 mg by mouth 2 (two) times daily.    levothyroxine (SYNTHROID) 100 MCG tablet Take 1 tablet (100 mcg total) by mouth daily before breakfast.    ondansetron (ZOFRAN ODT) 8 MG disintegrating tablet Take 1 tablet (8 mg total) by mouth every 8 (eight) hours as needed. Qty: 10 tablet, Refills: 0      STOP taking these medications     predniSONE (DELTASONE) 5 MG tablet        Allergies  Allergen Reactions  . Benadryl [Diphenhydramine Hcl] Diarrhea and Other (See Comments)    Reaction:  Makes her excessively sleepy    Follow-up Information    Julian HySOUTH,STEPHEN ALAN, MD. Schedule an appointment as soon as possible for a visit in 1 week(s).   Specialty:  Endocrinology Why:  Hospital follow up. Referral to psychiatry.  Contact information: [address] [place] [id number] [phone]            The results of significant diagnostics from this hospitalization (including imaging, microbiology, ancillary and laboratory) are listed below for reference.    Significant Diagnostic Studies: Nm Gastric Emptying  Result Date: 12/24/2015 CLINICAL DATA:  Nausea and vomiting for 4 months. EXAM: NUCLEAR MEDICINE GASTRIC EMPTYING SCAN TECHNIQUE: After oral ingestion of radiolabeled meal, sequential abdominal images were obtained for 120  minutes. Residual percentage of activity remaining within the stomach was calculated at 60 and 120 minutes. RADIOPHARMACEUTICALS:  2.0 mCi Tc-456m sulfur colloid in standardized meal COMPARISON:  None. FINDINGS: Expected location of the stomach in the left upper quadrant. Ingested meal empties the stomach gradually over the course of the study with 22% retention at 60 min and 6% retention at 120 min (normal retention less than 30% at a 120 min). IMPRESSION: Normal gastric emptying study. Electronically Signed   By: Lupita RaiderJames  Green Jr, M.D.   On: 12/24/2015 11:53   Dg Abdomen Acute W/chest  Result Date: 12/23/2015 CLINICAL DATA:  Nausea vomiting. EXAM: DG ABDOMEN ACUTE W/ 1V CHEST COMPARISON:  06/20/2015. FINDINGS: Surgical clips in the neck. Mediastinum and hilar structures normal. Lungs are clear of acute infiltrates. Knee bilateral nodular opacities noted over both lung bases consistent prominent nipple shadows. Soft tissue structures of the abdomen unremarkable. No bowel distention. No free air. Tiny sclerotic focus in the right iliac wing is unchanged most consistent 30 small bone island. No acute bony abnormality . IMPRESSION: Negative abdominal radiographs.  No acute cardiopulmonary disease. Electronically Signed   By: Maisie Fushomas  Register   On: 12/23/2015 13:28   Dg Kayleen MemosUgi  W/kub  Result Date: 12/25/2015 CLINICAL DATA:  Nausea and vomiting over the past few months. Weight loss. EXAM: UPPER GI SERIES WITH KUB TECHNIQUE: After obtaining a scout radiograph a routine upper GI series was performed using thin barium. FLUOROSCOPY TIME:  Fluoroscopy Time:  1 minutes, 46 seconds Radiation Exposure Index (if provided by the fluoroscopic device): Number of Acquired Spot Images: 2 COMPARISON:  Multiple exams, including 12/23/2015. FINDINGS: Initial KUB appears unremarkable. The patient was tremulous and nauseated and dizzy, and accordingly I was not able to stand her up for the exam or perform the full double contrast  assessment. A single contrast assessment was performed. The pharyngeal phase of swallowing was not assessed. Clips in the neck from prior thyroidectomy. Primary peristaltic waves in the esophagus were normal on 4 out of 4 swallows. No esophageal ulceration or stricture. A 13 mm barium tablet passed without difficulty into the stomach. No distal esophageal mucosal fold thickening or significant hiatal hernia was observed. Normal duodenal configuration. Mild accentuation of rugal folds along the greater curvature ascribed to nondistention. No filling defect in the stomach at balloon compression. Duodenal bulb, stomach antrum, and proximal duodenum unremarkable. IMPRESSION: 1. No specific abnormality to account for the patient's chronic nausea. Electronically Signed   By: Gaylyn RongWalter  Liebkemann M.D.   On: 12/25/2015 09:26   Ct Angio Abd/pel W And/or Wo Contrast  Result Date: 12/23/2015 CLINICAL DATA:  Epigastric pain and diffuse abdominal tenderness with nausea and vomiting for the last 2 months. Pain has become severe today. EXAM: CTA ABDOMEN AND PELVIS WITH CONTRAST TECHNIQUE: Multidetector CT imaging of the abdomen and pelvis was performed using the standard protocol during bolus administration of intravenous contrast. Multiplanar reconstructed images and MIPs were obtained and reviewed to evaluate the  vascular anatomy. CONTRAST:  80 mL Isovue 370 IV COMPARISON:  11/11/2015 FINDINGS: VASCULAR Aorta: Minimal atheromatous plaque. No aneurysm, dissection, or stenosis. Celiac: Patent SMA: Patent Renals: Both renal arteries are patent without evidence of aneurysm, dissection, vasculitis, fibromuscular dysplasia or significant stenosis. IMA: Patent without evidence of aneurysm, dissection, vasculitis or significant stenosis. Inflow: Patent without evidence of aneurysm, dissection, vasculitis or significant stenosis. Proximal Outflow: Bilateral common femoral and visualized portions of the superficial and profunda femoral  arteries are patent without evidence of aneurysm, dissection, vasculitis or significant stenosis. Veins: Early reflux down a dilated left gonadal vein. Review of the MIP images confirms the above findings. NON-VASCULAR Lower chest: Lung bases clear. No pleural or pericardial effusion. Bilateral breast implants partially visualized. Hepatobiliary: No focal liver abnormality is seen. No gallstones, gallbladder wall thickening, or biliary dilatation. Pancreas: Unremarkable. No pancreatic ductal dilatation or surrounding inflammatory changes. Spleen: Normal in size without focal abnormality. Adrenals/Urinary Tract: Adrenal glands are unremarkable. Kidneys are normal, without renal calculi, focal lesion, or hydronephrosis. Bladder is physiologically distended. Stomach/Bowel: Stomach is within normal limits. Appendix appears normal. No evidence of bowel wall thickening, distention, or inflammatory changes. Lymphatic: No adenopathy localized. Reproductive: Dilated refluxing left gonadal vein. Uterus and bilateral adnexal regions unremarkable. Other: No ascites.  No free air. Musculoskeletal: No acute or significant osseous findings. IMPRESSION: VASCULAR 1. No acute vascular findings. 2.  Aortic Atherosclerosis (ICD10-170.0) 3. Refluxing dilated left gonadal vein. Correlate with any clinical evidence of pelvic congestion syndrome. NON-VASCULAR 1. No acute findings Electronically Signed   By: Corlis Leak M.D.   On: 12/23/2015 15:03   US Abdomen Limited Ruq  Result Date: 12/23/2015 CLINICAL DATA:  Nausea and vomiting x2 months EXAM: US ABDOMEN LIMITED - RIGHT UPPER QUADRANT COMPARISON:  CT from earlier the same day FINDINGS: Gallbladder: No gallstones or wall thickening visualized. No sonographic Murphy sign noted by sonographer. Common bile duct: Diameter: 1.8 mm, unremarkable Liver: No focal lesion identified. Within normal limits in parenchymal echogenicity. IMPRESSION: 1. Negative Electronically Signed   By: Corlis Leak M.D.   On: 12/23/2015 19:19    Microbiology: No results found for this or any previous visit (from the past 240 hour(s)).   Labs: Basic Metabolic Panel:  Recent Labs Lab 12/23/15 1255 12/23/15 1940 12/24/15 0727 12/25/15 0350 12/26/15 0413 12/27/15 0411  NA 139  --  139 139 140 141  K 2.8*  --  4.4 4.4 3.9 3.6  CL 101  --  111 111 113* 108  CO2 28  --  23 25 23 27   GLUCOSE 90  --  102* 150* 128* 85  BUN <5*  --  <5* <5* 7 9  CREATININE 0.82  --  0.72 0.71 0.70 0.73  CALCIUM 9.3  --  8.8* 9.0 8.6* 8.9  MG  --  1.9  --   --   --   --    Liver Function Tests:  Recent Labs Lab 12/23/15 1255 12/25/15 0350  AST 36 23  ALT 35 22  ALKPHOS 58 43  BILITOT 0.9 0.4  PROT 7.7 5.6*  ALBUMIN 4.2 2.9*    Recent Labs Lab 12/23/15 1255  LIPASE 21   No results for input(s): AMMONIA in the last 168 hours. CBC:  Recent Labs Lab 12/23/15 1255 12/24/15 0727 12/26/15 0413 12/27/15 0411  WBC 3.9* 3.5* 9.4 7.4  NEUTROABS 2.3  --   --   --   HGB 11.2* 9.4* 8.3* 8.4*  HCT 32.7* 28.6* 25.6* 25.7*  MCV 74.1* 74.7* 75.5* 74.9*  PLT 239 198 182 173   Cardiac Enzymes: No results for input(s): CKTOTAL, CKMB, CKMBINDEX, TROPONINI in the last 168 hours. BNP: BNP (last 3 results) No results for input(s): BNP in the last 8760 hours.  ProBNP (last 3 results) No results for input(s): PROBNP in the last 8760 hours.  CBG: No results for input(s): GLUCAP in the last 168 hours.     SignedEdsel Petrin  [facility] 12/28/2015, 11:28 AM

## 2015-12-28 NOTE — Progress Notes (Signed)
Lauren Bolton to be D/Bolton'd Home per MD order.  Discussed with the patient and all questions fully answered.  VSS, IV catheter discontinued intact. Site without signs and symptoms of complications. Dressing and pressure applied.  An After Visit Summary was printed and given to the patient. Patient received prescription.  D/Bolton education completed with patient/family including follow up instructions, medication list, d/Bolton activities limitations if indicated, with other d/Bolton instructions as indicated by MD - patient able to verbalize understanding, all questions fully answered.   Patient instructed to return to ED, call 911, or call MD for any changes in condition.     L'ESPERANCE, Lauren Bolton 12/28/2015 12:57 PM

## 2015-12-28 NOTE — Progress Notes (Signed)
CSW updated about consult prior to discharge

## 2015-12-28 NOTE — Progress Notes (Signed)
LCSW has completed consult for psych outpatient. Placed referral on patients's dc paperwork for patient to follow up.  RN called and notified. No other needs.  Patient to DC home.  Deretha EmoryHannah Adylin Hankey LCSW, MSW Clinical Social Work: Optician, dispensingystem [employer] Coverage for :  [phone]

## 2015-12-28 NOTE — Discharge Instructions (Signed)
Malnutrition Introduction Malnutrition is any condition in which nutrition is poor. There are many forms of malnutrition. A common form is having too little of one kind of nutrient (nutritional deficiency). Nutrients include proteins, minerals, carbohydrates, fats, and vitamins. They provide the body with energy and keep the body working normally. Malnutrition ranges from mild to severe. The condition affects the body's defense system (immune system). Because of this, people who are malnourished are more likely to develop health problems and get sick. What are the causes? Causes of malnutrition include:  Eating an unbalanced diet.  Eating too much of certain foods.  Eating too little.  Conditions that decrease the body's ability to use nutrients. What increases the risk? Risk factors include:  Pregnancy and lactation. Women who are pregnant may become malnourished if they do not increase their nutrient intake. They are also susceptible to folic acid deficiency.  Increasing age. The body's ability to absorb nutrients decreases with age. This can contribute to iron, calcium, and vitamin D deficiencies.  Alcohol or drug dependency. Addiction often leads to a lifestyle in which proper nourishment is ignored. Dependency can also hurt the metabolism and the body's ability to absorb nutrients. Alcoholism is a major cause of thiamine deficiency and can lead to deficiencies of magnesium, zinc, and other vitamins.  Eating disorders, such as anorexia nervosa. People with these disorders may eat too little or too much.  Chewing or swallowing problems. People with these disorders may not eat enough.  Certain diseases, including:  First-lasting (chronic) diseases. Chronic diseases tend to affect the absorption of calcium, iron, and vitamins B12, A, D, E, and K.  Liver disease. Liver disease affects the storage of vitamins A and B12. It also interferes with the metabolism of protein and energy  sources.  Kidney disease. Kidney disease may cause deficiencies of protein, iron, and vitamin D.  Cancer or AIDS. These diseases can cause a loss of appetite.  Cystic fibrosis. This disease can make it difficult for the body to absorb nutrients.  Certain diets, including.  The vegetarian diet. Vegetarians are at risk for iron deficiency.  The vegan diet. Vegans are susceptible to vitamin B12, calcium, iron, vitamin D, and zinc deficiencies.  The fruitarian diet. This diet can be deficient in protein, sodium, and many micronutrients.  Many commercial "fad" diets, including those that claim to enhance well-being and reduce weight.  Very low calorie diets.  Low income. People with a low income may have trouble paying for nutritious foods. What are the signs or symptoms? Signs and symptoms depend on the kind of malnutrition you have. Common symptoms include:  Fatigue.  Weakness.  Dizziness.  Fainting  Weight loss.  Poor immune response.  Lack of menstruation.  Hair loss.  Poor memory. How is this diagnosed? Malnutrition may be diagnosed by:  A medical history.  A dietary history.  A physical exam. This may include a measurement of your body mass index (BMI).  Blood tests. How is this treated? Treatments vary depending on the cause of the malnutrition. Common treatments include:  Dietary changes.  Dietary supplements, such as vitamins and minerals.  Treatment of any underlying conditions. Follow these instructions at home:  Eat a balanced diet.  Take dietary supplements as directed by your health care provider.  Exercise regularly. Exercising can improve appetite.  Keep all follow-up visits as directed by your health care provider. This is important. How is this prevented? Eating a well-balanced diet helps to prevent most forms of malnutrition. Contact a   health care provider if:  You have increased weakness or fatigue.  You faint.  You stop  menstruating.  You have rapid hair loss.  You have unexpected weight loss. This information is not intended to replace advice given to you by your health care provider. Make sure you discuss any questions you have with your health care provider. Document Released: 11/07/2004 Document Revised: 05/30/2015 Document Reviewed: 08/18/2013  2017 Elsevier  

## 2016-01-02 ENCOUNTER — Ambulatory Visit (HOSPITAL_COMMUNITY): Admission: RE | Admit: 2016-01-02 | Payer: BLUE CROSS/BLUE SHIELD | Source: Ambulatory Visit

## 2016-01-03 DIAGNOSIS — E890 Postprocedural hypothyroidism: Secondary | ICD-10-CM | POA: Diagnosis not present

## 2016-01-03 DIAGNOSIS — E876 Hypokalemia: Secondary | ICD-10-CM | POA: Diagnosis not present

## 2016-01-03 DIAGNOSIS — D6489 Other specified anemias: Secondary | ICD-10-CM | POA: Diagnosis not present

## 2016-01-03 DIAGNOSIS — R5381 Other malaise: Secondary | ICD-10-CM | POA: Diagnosis not present

## 2016-01-07 DIAGNOSIS — E890 Postprocedural hypothyroidism: Secondary | ICD-10-CM | POA: Diagnosis not present

## 2016-01-22 ENCOUNTER — Ambulatory Visit (HOSPITAL_COMMUNITY): Payer: Self-pay | Admitting: Licensed Clinical Social Worker

## 2016-01-29 DIAGNOSIS — F329 Major depressive disorder, single episode, unspecified: Secondary | ICD-10-CM | POA: Diagnosis not present

## 2016-02-04 ENCOUNTER — Ambulatory Visit: Payer: BLUE CROSS/BLUE SHIELD | Admitting: Gastroenterology

## 2016-02-05 ENCOUNTER — Telehealth (HOSPITAL_COMMUNITY): Payer: Self-pay | Admitting: Psychiatry

## 2016-02-05 ENCOUNTER — Encounter (HOSPITAL_COMMUNITY): Payer: Self-pay | Admitting: *Deleted

## 2016-02-05 ENCOUNTER — Encounter (INDEPENDENT_AMBULATORY_CARE_PROVIDER_SITE_OTHER): Payer: Self-pay

## 2016-02-05 ENCOUNTER — Emergency Department (HOSPITAL_COMMUNITY)
Admission: EM | Admit: 2016-02-05 | Discharge: 2016-02-06 | Disposition: A | Discharge status: Other Acute Inpatient Hospital | Payer: BLUE CROSS/BLUE SHIELD | Attending: Emergency Medicine | Admitting: Emergency Medicine

## 2016-02-05 ENCOUNTER — Ambulatory Visit (INDEPENDENT_AMBULATORY_CARE_PROVIDER_SITE_OTHER): Payer: BLUE CROSS/BLUE SHIELD | Admitting: Clinical

## 2016-02-05 ENCOUNTER — Encounter (HOSPITAL_COMMUNITY): Payer: Self-pay | Admitting: Clinical

## 2016-02-05 DIAGNOSIS — E039 Hypothyroidism, unspecified: Secondary | ICD-10-CM | POA: Insufficient documentation

## 2016-02-05 DIAGNOSIS — Z79899 Other long term (current) drug therapy: Secondary | ICD-10-CM | POA: Diagnosis not present

## 2016-02-05 DIAGNOSIS — R45851 Suicidal ideations: Secondary | ICD-10-CM

## 2016-02-05 DIAGNOSIS — F332 Major depressive disorder, recurrent severe without psychotic features: Secondary | ICD-10-CM | POA: Insufficient documentation

## 2016-02-05 DIAGNOSIS — F322 Major depressive disorder, single episode, severe without psychotic features: Secondary | ICD-10-CM

## 2016-02-05 DIAGNOSIS — F429 Obsessive-compulsive disorder, unspecified: Secondary | ICD-10-CM

## 2016-02-05 LAB — COMPREHENSIVE METABOLIC PANEL
ALBUMIN: 4.3 g/dL (ref 3.5–5.0)
ALK PHOS: 63 U/L (ref 38–126)
ALT: 24 U/L (ref 14–54)
AST: 27 U/L (ref 15–41)
Anion gap: 9 (ref 5–15)
BILIRUBIN TOTAL: 0.6 mg/dL (ref 0.3–1.2)
BUN: 11 mg/dL (ref 6–20)
CALCIUM: 9.2 mg/dL (ref 8.9–10.3)
CO2: 27 mmol/L (ref 22–32)
CREATININE: 0.82 mg/dL (ref 0.44–1.00)
Chloride: 104 mmol/L (ref 101–111)
GFR calc Af Amer: >60 mL/min (ref >60)
GLUCOSE: 109 mg/dL — AB (ref 65–99)
Potassium: 3.7 mmol/L (ref 3.5–5.1)
Sodium: 140 mmol/L (ref 135–145)
TOTAL PROTEIN: 7.7 g/dL (ref 6.5–8.1)

## 2016-02-05 LAB — CBC WITH DIFFERENTIAL/PLATELET
BASOS PCT: 0 %
Basophils Absolute: 0 10*3/uL (ref 0.0–0.1)
Eosinophils Absolute: 0.1 10*3/uL (ref 0.0–0.7)
Eosinophils Relative: 1 %
HEMATOCRIT: 33.2 % — AB (ref 36.0–46.0)
HEMOGLOBIN: 10.9 g/dL — AB (ref 12.0–15.0)
LYMPHS PCT: 23 %
Lymphs Abs: 1.6 10*3/uL (ref 0.7–4.0)
MCH: 24.4 pg — ABNORMAL LOW (ref 26.0–34.0)
MCHC: 32.8 g/dL (ref 30.0–36.0)
MCV: 74.4 fL — AB (ref 78.0–100.0)
MONOS PCT: 5 %
Monocytes Absolute: 0.4 10*3/uL (ref 0.1–1.0)
NEUTROS ABS: 5 10*3/uL (ref 1.7–7.7)
NEUTROS PCT: 71 %
Platelets: 190 10*3/uL (ref 150–400)
RBC: 4.46 MIL/uL (ref 3.87–5.11)
RDW: 13.8 % (ref 11.5–15.5)
WBC: 7 10*3/uL (ref 4.0–10.5)

## 2016-02-05 LAB — RAPID URINE DRUG SCREEN, HOSP PERFORMED
AMPHETAMINES: NOT DETECTED
BARBITURATES: NOT DETECTED
Benzodiazepines: POSITIVE — AB
Cocaine: NOT DETECTED
Opiates: NOT DETECTED
TETRAHYDROCANNABINOL: NOT DETECTED

## 2016-02-05 LAB — ETHANOL: Alcohol, Ethyl (B): <5 mg/dL (ref <5)

## 2016-02-05 LAB — SALICYLATE LEVEL: Salicylate Lvl: <7 mg/dL (ref 2.8–30.0)

## 2016-02-05 LAB — ACETAMINOPHEN LEVEL: Acetaminophen (Tylenol), Serum: <10 ug/mL — ABNORMAL LOW (ref 10–30)

## 2016-02-05 MED ORDER — DOCUSATE SODIUM 100 MG PO CAPS: 100.0000 mg | ORAL_CAPSULE | Freq: Every day | ORAL | Status: DC | PRN

## 2016-02-05 MED ORDER — IBUPROFEN 200 MG PO TABS
600.0000 mg | ORAL_TABLET | Freq: Three times a day (TID) | ORAL | Status: DC | PRN
  Administered 2016-02-05: 200 mg via ORAL
  Filled 2016-02-05: qty 3

## 2016-02-05 MED ORDER — LORAZEPAM 1 MG PO TABS: 1.0000 mg | ORAL_TABLET | Freq: Three times a day (TID) | ORAL | Status: DC | PRN

## 2016-02-05 MED ORDER — POLYSACCHARIDE IRON COMPLEX 150 MG PO CAPS
150.0000 mg | ORAL_CAPSULE | Freq: Two times a day (BID) | ORAL | Status: DC
  Administered 2016-02-05: 150 mg via ORAL
  Filled 2016-02-05 (×2): qty 1

## 2016-02-05 MED ORDER — ACETAMINOPHEN 325 MG PO TABS: 650.0000 mg | ORAL_TABLET | ORAL | Status: DC | PRN

## 2016-02-05 MED ORDER — ALUM & MAG HYDROXIDE-SIMETH 200-200-20 MG/5ML PO SUSP
30.0000 mL | ORAL | Status: DC | PRN
Start: 2016-02-05 — End: 2016-02-06

## 2016-02-05 MED ORDER — LEVOTHYROXINE SODIUM 100 MCG PO TABS
100.0000 ug | ORAL_TABLET | Freq: Every day | ORAL | Status: DC
  Filled 2016-02-05: qty 1

## 2016-02-05 MED ORDER — MIRTAZAPINE 7.5 MG PO TABS
7.5000 mg | ORAL_TABLET | Freq: Every day | ORAL | Status: DC
  Administered 2016-02-05: 7.5 mg via ORAL
  Filled 2016-02-05: qty 1

## 2016-02-05 MED ORDER — SIMETHICONE 80 MG PO CHEW
160.0000 mg | CHEWABLE_TABLET | Freq: Four times a day (QID) | ORAL | Status: DC | PRN
  Administered 2016-02-06: 160 mg via ORAL
  Filled 2016-02-05: qty 2

## 2016-02-05 MED ORDER — LORAZEPAM 0.5 MG PO TABS
0.5000 mg | ORAL_TABLET | Freq: Two times a day (BID) | ORAL | Status: DC | PRN
  Administered 2016-02-05: 0.5 mg via ORAL
  Filled 2016-02-05: qty 1

## 2016-02-05 MED ORDER — CALCIUM CARBONATE ANTACID 500 MG PO CHEW
1.0000 | CHEWABLE_TABLET | ORAL | Status: DC | PRN
  Filled 2016-02-05: qty 1

## 2016-02-05 NOTE — Telephone Encounter (Signed)
Brief psychiatry note   This Probation officer met with a patient briefly upon request, who visited Ms. Lauren Bolton for therapy. She endorses SI to take pills during the interview with Ms. Lauren Bolton.   This Probation officer had taken care of this patient before during consult service (please see the note for details written in 12/27/2015). Lauren Bolton is a 56 y.o. female with history of depression, anxiety, hypothyroidism status post goiter removal, iron deficiency anemia presented with intractable nausea and vomiting. During her admission to [facility] in 12/2015, psychiatry was consulted for depression, anxiety and to find out any psychological components which can be contributed to her intractable nausea and vomiting given she has had no significant medical findings to cause her symptoms.   On today's evaluation, she reports limited benefit from mirtazapine except for her insomnia. She continues to have appetite loss and she has had worsening depression. She was tearful through the interview and exam is notable for her rumination on her ongoing somatic symptoms and anticipatory anxiety. Although it is difficult to discern whether her GI symptoms are secondary to depression/anxiety, she will greatly benefit from inpatient stabilization given severity of her symptoms which includes SI with plan to take medication. She agrees with voluntary admission. Noted that mirtazapine can be further uptitrated and olanzapine may be a good option to add as adjunctive treatment which would also be beneficial for appetite/nausea. She will be escorted to [facility] ED for medical clearance before psychiatry admission.

## 2016-02-05 NOTE — BH Assessment (Addendum)
Tele Assessment Note   Lauren Bolton is an 56 y.o. married female who presents to [facility] ED for medical clearance prior to psychiatric admission. Pt was seen by her therapist, Radene Knee and her psychiatrist Dr. Golda Acre at [facility]. Pt is being treated for depression and OCD. Pre note by Dr. Vanetta Shawl, today Pt presented with rumination of somatic symptoms, anticipatory anxiety and suicidal ideation with plan to take medication. Dr. Vanetta Shawl said that Pt would greatly benefit from inpatient psychiatric treatment and referred Pt to [facility] ED for medical clearance.  Pt's is from [place], speaks limited English and assessment was completed with telephone translation line. Pt reports feeling sad, anxious and depressed because she feels her providers cannot determine what is wrong with her. 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 She reports symptoms including fatigue, decreased concentration, tearfulness, poor appetite and feelings of worthlessness and hopelessness. Pt note by Radene Knee, Pt has disruption with routine/functioning, "Driven" to perform behaviors/acts, Intended to reduce stress or prevent another outcome (Check doors,&stove, Washing hands and rewashing things, Has a fear of germs, sometimes believes her food is contaminated). Pt reports during assessment that she is fearful of dying. Pt's husband reports she has been to the emergency department 6-7 times in the past few months due to somatic complaints and anxiety. Pt denies any history of suicide attempts. Pt denies homicidal ideation or history of violence. She denies any auditory or visual hallucinations. She denies any history of alcohol or substance abuse.  See note by Radene Knee, LCSW for additional clinical information.  Pt is dressed in hospital gown, alert, oriented x4 with normal speech  and normal motor behavior. Eye contact is good and Pt is tearful. Pt's mood is depressed, sad and anxious; affect is congruent with mood. Thought process is coherent and relevant. There is no indication Pt is currently responding to internal stimuli or experiencing delusional thought content. Pt is very anxious about being admitted to a psychiatric facility and says she had never been away from her husband.   Diagnosis: Major Depressive Disorder, Recurrent, Severe Without Psychotic Features; Obsessive Compulsive Disorder  Past Medical History:  Past Medical History:  Diagnosis Date  . Iron deficiency anemia   . Thyroid disease     Past Surgical History:  Procedure Laterality Date  . ESOPHAGOGASTRODUODENOSCOPY N/A 11/13/2015   Procedure: ESOPHAGOGASTRODUODENOSCOPY (EGD);  Surgeon: Dorena Cookey, MD;  Location: [facility];  Service: Endoscopy;  Laterality: N/A;  . ESOPHAGOGASTRODUODENOSCOPY (EGD) WITH PROPOFOL N/A 12/26/2015   Procedure: ESOPHAGOGASTRODUODENOSCOPY (EGD) WITH PROPOFOL;  Surgeon: Bernette Redbird, MD;  Location: [facility] ENDOSCOPY;  Service: Endoscopy;  Laterality: N/A;  . PLACEMENT OF BREAST IMPLANTS     20 years ago  . thyroid goiter      Family History: No family history on file.  Social History:  reports that she has never smoked. She has never used smokeless tobacco. She reports that she does not drink alcohol or use drugs.  Additional Social History:  Alcohol / Drug Use Pain Medications: SEE MAR Prescriptions: SEE MAR Over the Counter: SEE MAR History of alcohol / drug use?: No history of alcohol / drug abuse Longest period of sobriety (when/how Brutus): NA  CIWA: CIWA-Ar BP: 151/81 Pulse Rate: 82 COWS:    PATIENT STRENGTHS: (choose at least two) Ability for insight Average or above average intelligence Capable of independent living  Financial means General fund of knowledge Physical Health Supportive family/friends  Allergies:  Allergies  Allergen Reactions   . Benadryl [Diphenhydramine Hcl] Diarrhea and Other (See Comments)    Reaction:  Makes her excessively sleepy     Home Medications:  (Not in a hospital admission)  OB/GYN Status:  No LMP recorded. Patient is postmenopausal.  General Assessment Data Location of Assessment: [facility] TTS Assessment: In system Is this a Tele or Face-to-Face Assessment?: Face-to-Face Is this an Initial Assessment or a Re-assessment for this encounter?: Initial Assessment Marital status: Married [place] name: NA Is patient pregnant?: No Pregnancy Status: No Living Arrangements: Parent, Other relatives (Husband and adult son) Can pt return to current living arrangement?: Yes Admission Status: Voluntary Is patient capable of signing voluntary admission?: Yes Referral Source: Self/Family/Friend Insurance type: BCBS     Crisis Care Plan Living Arrangements: Parent, Other relatives (Husband and adult son) Legal Guardian: Other: (Self) Name of Psychiatrist: Dr. Vanetta Shawl Name of Therapist: Radene Knee  Education Status Is patient currently in school?: No Current Grade: NA Highest grade of school patient has completed: NA Name of school: NA Contact person: NA  Risk to self with the past 6 months Suicidal Ideation: Yes-Currently Present Has patient been a risk to self within the past 6 months prior to admission? : Yes Suicidal Intent: Yes-Currently Present Has patient had any suicidal intent within the past 6 months prior to admission? : Yes Is patient at risk for suicide?: Yes Suicidal Plan?: Yes-Currently Present Has patient had any suicidal plan within the past 6 months prior to admission? : Yes Specify Current Suicidal Plan: Overdose on pills Access to Means: Yes Specify Access to Suicidal Means: Access to multiple prescription medications What has been your use of drugs/alcohol within the last 12 months?: Pr denies Previous Attempts/Gestures: No How many times?: 0 Other Self Harm Risks:  None Triggers for Past Attempts: None known Intentional Self Injurious Behavior: None Family Suicide History: Unknown Recent stressful life event(s): Financial Problems, Job Loss Persecutory voices/beliefs?: No Depression: Yes Depression Symptoms: Despondent, Tearfulness, Isolating, Fatigue, Guilt, Loss of interest in usual pleasures, Feeling worthless/self pity Substance abuse history and/or treatment for substance abuse?: No Suicide prevention information given to non-admitted patients: Not applicable  Risk to Others within the past 6 months Homicidal Ideation: No Does patient have any lifetime risk of violence toward others beyond the six months prior to admission? : No Thoughts of Harm to Others: No Current Homicidal Intent: No Current Homicidal Plan: No Access to Homicidal Means: No Identified Victim: None History of harm to others?: No Assessment of Violence: None Noted Violent Behavior Description: Pt denies history of violence Does patient have access to weapons?: No Criminal Charges Pending?: No Does patient have a court date: No Is patient on probation?: No  Psychosis Hallucinations: None noted Delusions: None noted  Mental Status Report Appearance/Hygiene: In hospital gown Eye Contact: Good Motor Activity: Unremarkable Speech: Logical/coherent Level of Consciousness: Alert Mood: Depressed, Sad, Anxious Affect: Anxious, Depressed Anxiety Level: Severe Thought Processes: Coherent, Relevant Judgement: Unimpaired Orientation: Person, Place, Time, Situation, Appropriate for developmental age Obsessive Compulsive Thoughts/Behaviors: Moderate  Cognitive Functioning Concentration: Normal Memory: Recent Intact, Remote Intact IQ: Average Insight: Fair Impulse Control: Good Appetite: Poor Weight Loss: 0 Weight Gain: 0 Sleep: No Change Total Hours of Sleep: 7 Vegetative Symptoms: None  ADLScreening [facility] Assessment Services) Patient's cognitive ability  adequate to safely complete daily activities?: Yes Patient able to express need for assistance with ADLs?: Yes Independently performs  ADLs?: Yes (appropriate for developmental age)  Prior Inpatient Therapy Prior Inpatient Therapy: No Prior Therapy Dates: NA Prior Therapy Facilty/Provider(s): NA Reason for Treatment: NA  Prior Outpatient Therapy Prior Outpatient Therapy: Yes Prior Therapy Dates: Current Prior Therapy Facilty/Provider(s): [facility] Reason for Treatment: Depression Does patient have an ACCT team?: No Does patient have Intensive In-House Services?  : No Does patient have [facility] services? : No Does patient have P4CC services?: No  ADL Screening (condition at time of admission) Patient's cognitive ability adequate to safely complete daily activities?: Yes Is the patient deaf or have difficulty hearing?: No Does the patient have difficulty seeing, even when wearing glasses/contacts?: No Does the patient have difficulty concentrating, remembering, or making decisions?: No Patient able to express need for assistance with ADLs?: Yes Does the patient have difficulty dressing or bathing?: No Independently performs ADLs?: Yes (appropriate for developmental age) Does the patient have difficulty walking or climbing stairs?: No Weakness of Legs: None Weakness of Arms/Hands: None  Home Assistive Devices/Equipment Home Assistive Devices/Equipment: None    Abuse/Neglect Assessment (Assessment to be complete while patient is alone) Physical Abuse: Denies Verbal Abuse: Denies Sexual Abuse: Denies Exploitation of patient/patient's resources: Denies Self-Neglect: Denies     Merchant navy officerAdvance Directives (For Healthcare) Does Patient Have a Medical Advance Directive?: No Would patient like information on creating a medical advance directive?: No - Patient declined    Additional Information 1:1 In Past 12 Months?: No CIRT Risk: No Elopement Risk:  No Does patient have medical clearance?: Yes     Disposition: Clint Bolderori Beck[facility], confirmed bed availability. Gave clinical report to Donell SievertSpencer Simon, PA who said Pt meets criteria for inpatient psychiatric treatment and accepted Pt to the service of Dr. Carmon GinsbergF. Cobos, room 403-2. Pt signed voluntary consent for treatment. Notified Dr. Shaune Pollackameron Isaacs and TCU staff of acceptance.  Disposition Initial Assessment Completed for this Encounter: Yes Disposition of Patient: Inpatient treatment program Type of inpatient treatment program: Adult   Pamalee LeydenFord Ellis Story Vanvranken Jr, [facility], [facility], [facility] Triage Specialist [phone]   Pamalee LeydenWarrick Jr, Roald Lukacs Ellis 02/05/2016 8:17 PM

## 2016-02-05 NOTE — ED Notes (Signed)
Pt refused ibuprofen 600 mg stating "I only take 1 200 mg at home".

## 2016-02-05 NOTE — ED Provider Notes (Signed)
[facility]-EMERGENCY DEPT Provider Note   CSN: [id number] Arrival date & time: 02/05/16  1548     History   Chief Complaint Chief Complaint  Patient presents with  . Suicidal  . Medical Clearance    HPI Lauren Bolton is a 56 y.o. female.  HPI 56 year old female who presents with depression and suicidal ideation. She has a history of thyroid disease and iron deficiency anemia. She was sent to the ED from behavioral health where she was having therapy today. She has had ED evaluations and admissions into the hospital for episodic intractable nausea and vomiting. Has had negative GI studies, and ultimately it was felt that maybe her symptoms were related to depression. At her therapy appointment today she endorses that she is currently not doing well. Has not been eating or drinking, continues to have occasional nausea and vomiting although much decreased since starting Remeron. Has had thoughts of suicidal ideation and I told her therapist that she has had thoughts of overdosing on her pills. Was subsequently sent to the ED for medical clearance as it was felt by behavioral health psychiatrist that she likely needed inpatient psychiatric treatment and that she would be a voluntary admission. No HI. The nurse spoke with patient be a [place] interpreter, and at this time patient denies any SI, HI, auditory or visual hallucinations and states that she is not sure why she is here.   Past Medical History:  Diagnosis Date  . Iron deficiency anemia   . Thyroid disease     Patient Active Problem List   Diagnosis Date Noted  . Adjustment disorder with mixed anxiety and depressed mood 12/26/2015  . Malnutrition (HCC)   . Intractable vomiting 12/23/2015  . Hypokalemia 11/11/2015  . Iron deficiency anemia 11/11/2015  . Malaise and fatigue 11/11/2015  . Failure to thrive in adult 11/11/2015  . Protein-calorie malnutrition, severe 11/11/2015  . Nausea with vomiting 11/11/2015  . Anxiety state   .  Decreased oral intake   . Hypothyroidism 11/10/2015    Past Surgical History:  Procedure Laterality Date  . ESOPHAGOGASTRODUODENOSCOPY N/A 11/13/2015   Procedure: ESOPHAGOGASTRODUODENOSCOPY (EGD);  Surgeon: Dorena Cookey, MD;  Location: [facility];  Service: Endoscopy;  Laterality: N/A;  . ESOPHAGOGASTRODUODENOSCOPY (EGD) WITH PROPOFOL N/A 12/26/2015   Procedure: ESOPHAGOGASTRODUODENOSCOPY (EGD) WITH PROPOFOL;  Surgeon: Bernette Redbird, MD;  Location: [facility] ENDOSCOPY;  Service: Endoscopy;  Laterality: N/A;  . PLACEMENT OF BREAST IMPLANTS     20 years ago  . thyroid goiter      OB History    No data available       Home Medications    Prior to Admission medications   Medication Sig Start Date End Date Taking? Authorizing Provider  calcium carbonate (TUMS - DOSED IN MG ELEMENTAL CALCIUM) 500 MG chewable tablet Chew 1 tablet by mouth as needed for indigestion or heartburn.   Yes Historical Provider, MD  docusate sodium (COLACE) 100 MG capsule Take 100 mg by mouth daily as needed for mild constipation or moderate constipation.   Yes Historical Provider, MD  iron polysaccharides (NIFEREX) 150 MG capsule Take 150 mg by mouth 2 (two) times daily.   Yes Historical Provider, MD  levothyroxine (SYNTHROID) 100 MCG tablet Take 1 tablet (100 mcg total) by mouth daily before breakfast. 11/14/15  Yes Leana Roe Elgergawy, MD  LORazepam (ATIVAN) 0.5 MG tablet Take 0.5 mg by mouth 2 (two) times daily as needed for anxiety.   Yes Historical Provider, MD  mirtazapine (REMERON) 7.5 MG tablet  Take 1 tablet (7.5 mg total) by mouth at bedtime. 12/28/15  Yes Maryann Mikhail, DO  simethicone (MYLICON) 80 MG chewable tablet Chew 2 tablets (160 mg total) by mouth 4 (four) times daily as needed for flatulence. 12/28/15  Yes Maryann Mikhail, DO  Amino Acids-Protein Hydrolys (FEEDING SUPPLEMENT, PRO-STAT SUGAR FREE 64,) LIQD Take 30 mLs by mouth 3 (three) times daily between meals. Patient not taking: Reported on  02/05/2016 12/28/15   Nita SellsMaryann Mikhail, DO  omeprazole (PRILOSEC) 40 MG capsule Take 1 capsule (40 mg total) by mouth every other day. Take every other day over the next 2 weeks, then stop Patient not taking: Reported on 02/05/2016 12/28/15   Maryann Mikhail, DO  ondansetron (ZOFRAN ODT) 8 MG disintegrating tablet Take 1 tablet (8 mg total) by mouth every 8 (eight) hours as needed. Patient not taking: Reported on 02/05/2016 12/14/15   Zadie Rhineonald Wickline, MD    Family History No family history on file.  Social History Social History  Substance Use Topics  . Smoking status: Never Smoker  . Smokeless tobacco: Never Used  . Alcohol use No     Allergies   Benadryl [diphenhydramine hcl]   Review of Systems Review of Systems 10/14 systems reviewed and are negative other than those stated in the HPI   Physical Exam Updated Vital Signs BP 151/81 (BP Location: Left Arm)   Pulse 82   Temp 98.4 F (36.9 C) (Oral)   Resp 18   SpO2 100%   Physical Exam Physical Exam  Nursing note and vitals reviewed. Constitutional: Thin and malnourished appearing non-toxic, and in no acute distress Head: Normocephalic and atraumatic.  Mouth/Throat: Oropharynx is clear and moist.  Neck: Normal range of motion. Neck supple.  Cardiovascular: Normal rate and regular rhythm.   Pulmonary/Chest: Effort normal and breath sounds normal.  Abdominal: Soft. There is no tenderness. There is no rebound and no guarding.  Musculoskeletal: Normal range of motion.  Neurological: Alert, no facial droop, fluent speech, moves all extremities symmetrically Skin: Skin is warm and dry.  Psychiatric: Cooperative   ED Treatments / Results  Labs (all labs ordered are listed, but only abnormal results are displayed) Labs Reviewed  CBC WITH DIFFERENTIAL/PLATELET - Abnormal; Notable for the following:       Result Value   Hemoglobin 10.9 (*)    HCT 33.2 (*)    MCV 74.4 (*)    MCH 24.4 (*)    All other components within  normal limits  COMPREHENSIVE METABOLIC PANEL - Abnormal; Notable for the following:    Glucose, Bld 109 (*)    All other components within normal limits  ACETAMINOPHEN LEVEL - Abnormal; Notable for the following:    Acetaminophen (Tylenol), Serum <10 (*)    All other components within normal limits  RAPID URINE DRUG SCREEN, HOSP PERFORMED - Abnormal; Notable for the following:    Benzodiazepines POSITIVE (*)    All other components within normal limits  ETHANOL  SALICYLATE LEVEL    EKG  EKG Interpretation None       Radiology No results found.  Procedures Procedures (including critical care time)  Medications Ordered in ED Medications  alum & mag hydroxide-simeth (MAALOX/MYLANTA) 200-200-20 MG/5ML suspension 30 mL (not administered)  ibuprofen (ADVIL,MOTRIN) tablet 600 mg (not administered)  acetaminophen (TYLENOL) tablet 650 mg (not administered)  LORazepam (ATIVAN) tablet 1 mg (not administered)  calcium carbonate (TUMS - dosed in mg elemental calcium) chewable tablet 200 mg of elemental calcium (not administered)  docusate sodium (  COLACE) capsule 100 mg (not administered)  iron polysaccharides (NIFEREX) capsule 150 mg (not administered)  levothyroxine (SYNTHROID, LEVOTHROID) tablet 100 mcg (not administered)  LORazepam (ATIVAN) tablet 0.5 mg (not administered)  mirtazapine (REMERON) tablet 7.5 mg (not administered)  simethicone (MYLICON) chewable tablet 160 mg (not administered)     Initial Impression / Assessment and Plan / ED Course  I have reviewed the triage vital signs and the nursing notes.  Pertinent labs & imaging results that were available during my care of the patient were reviewed by me and considered in my medical decision making (see chart for details).     Presenting with SI and depression. Nontoxic in no acute distress although disappears then and malnourished. Vital signs stable and exam otherwise unremarkable. We'll obtain basic blood work and  consult TTS for potential placement for inpatient treatment.  Blood work reassuring without major left side or metabolic derangements. Felt medically cleared for TTS consult and placement for likely inpatient admission.   Final Clinical Impressions(s) / ED Diagnoses   Final diagnoses:  Suicidal ideations    New Prescriptions New Prescriptions   No medications on file     Lavera Guise, MD 02/05/16 [phone]

## 2016-02-05 NOTE — ED Notes (Signed)
Pt c/o h/a, requesting ibuprofen.

## 2016-02-05 NOTE — ED Notes (Signed)
Pt was sent from behavioral health after concerns for need for inpatient placement.

## 2016-02-05 NOTE — ED Triage Notes (Signed)
[facility]. Pt reports suicidal ideation. Ines Bloomer. [facility] Dr. Roseanne RenoHassan recommends inpatient treatment once patient is medically cleared. Pt has hx of anemia.

## 2016-02-05 NOTE — ED Notes (Signed)
Pt requesting ativan d/t anxiety.

## 2016-02-05 NOTE — ED Notes (Signed)
Ford, TTS, in with pt. 

## 2016-02-05 NOTE — ED Notes (Addendum)
Pt stated "I have to take my synthroid @ 0800 and my iron @ 1300 and 2000.  That's what my doctor told me."  [facility].

## 2016-02-05 NOTE — Progress Notes (Signed)
Comprehensive Clinical Assessment (CCA) Note  02/05/2016 Lauren Bolton [id number]  Visit Diagnosis:      ICD-9-CM ICD-10-CM   1. Major depressive disorder, single episode, severe (HCC) 296.23 F32.2   2. Obsessive-compulsive disorder, unspecified type 300.3 F42.9       CCA Part One  Part One has been completed on paper by the patient.  (See scanned document in Chart Review)  CCA Part Two A  Intake/Chief Complaint:  CCA Intake With Chief Complaint CCA Part Two Date: 02/05/16 CCA Part Two Time: 1330 Chief Complaint/Presenting Problem: Depression -Severe, some OCD/Anxiety thoughts and behaviors Patients Currently Reported Symptoms/Problems: June stopped being abkle to Eat and felt poorly. Several test were run but no cause was founf. Client is frustrated and concerned she will never get better Collateral Involvement: Husband - Supportive Individual's Strengths: "I don't know" Individual's Preferences: "I want to feel better." Initial Clinical Notes/Concerns: April went to [place] May 10th still fine. On June 15th the problems began. Hospitalizations and several test were done. No cause was found. Having some suicidal ideation. Severe  Mental Health Symptoms Depression:  Depression: Change in energy/activity, Difficulty Concentrating, Fatigue, Hopelessness, Increase/decrease in appetite, Irritability, Sleep (too much or little), Tearfulness, Weight gain/loss, Worthlessness (throwing up even on an empty stomach. Isolating, Suicidal ideation)  Mania:  Mania: N/A  Anxiety:   Anxiety: N/A, Difficulty concentrating, Fatigue, Irritability, Restlessness, Sleep, Tension, Worrying (worried if I feel better, go back to work.)  Psychosis:  Psychosis: N/A  Trauma:     Obsessions:  Obsessions: Attempts to suppress/neutralize, Disrupts routine/functioning, Poor insight  Compulsions:  Compulsions: Disrupts with routine/functioning, "Driven" to perform behaviors/acts, Intended to reduce stress or  prevent another outcome (Check doors,&stove, Washing hands and rewashing things, Has a fear of germs, sometimes believes her food is contaminated )  Inattention:  Inattention: N/A  Hyperactivity/Impulsivity:  Hyperactivity/Impulsivity: N/A  Oppositional/Defiant Behaviors:  Oppositional/Defiant Behaviors: N/A  Borderline Personality:  Emotional Irregularity: N/A  Other Mood/Personality Symptoms:      Mental Status Exam Appearance and self-care  Stature:  Stature: Small  Weight:  Weight: Underweight  Clothing:  Clothing: Casual  Grooming:  Grooming: Normal  Cosmetic use:  Cosmetic Use: None  Posture/gait:  Posture/Gait: Slumped  Motor activity:  Motor Activity: Slowed  Sensorium  Attention:  Attention: Normal  Concentration:  Concentration: Normal  Orientation:  Orientation: X5  Recall/memory:  Recall/Memory: Normal  Affect and Mood  Affect:  Affect: Depressed  Mood:  Mood: Depressed  Relating  Eye contact:  Eye Contact: Normal  Facial expression:  Facial Expression: Depressed  Attitude toward examiner:  Attitude Toward Examiner: Cooperative  Thought and Language  Speech flow: Speech Flow: Paucity  Thought content:  Thought Content: Appropriate to mood and circumstances  Preoccupation:  Preoccupations: Somatic  Hallucinations:     Organization:     Company secretaryxecutive Functions  Fund of Knowledge:  Fund of Knowledge: Average  Intelligence:  Intelligence: Average  Abstraction:  Abstraction: Normal  Judgement:  Judgement: Poor  Reality Testing:  Reality Testing: Distorted  Insight:  Insight: Fair  Decision Making:  Decision Making: Paralyzed  Social Functioning  Social Maturity:  Social Maturity: Isolates  Social Judgement:  Social Judgement: Victimized  Stress  Stressors:  Stressors: Illness, Arts administratorMoney, Work (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 Because of illnes, needed to close the restraunt. Because she cannot work, and I am taking care of her. It causes lots of worry. Are you sexually active?: No What is your sexual orientation?: Heterosexual Does patient have children?: Yes How many children?: 2 How is patient's relationship with their children?: David 33 - [name] - 32 - I am not feeling like talking to anyone right now  Childhood History:  Childhood History By whom was/is the patient raised?: Mother Additional childhood history information: Father passed away when I was 7 years old. It was a good childhood. Came to [place] as refugee in 1981[place]06[place]04. [place] and am a [place] citizism  Description of patient's relationship with caregiver when they were a child: Mother - was okay, We were all close. Close with Father before he passed Patient's description of current relationship with people who raised him/her: Mother passed 06-05-2003 , Father died when I was 10 How were you disciplined when you got in trouble as a child/adolescent?: I didn't get in trouble,  Does patient have siblings?: Yes Number of Siblings: 8 Description of patient's current relationship with siblings: Some [place] some American  Did patient suffer any verbal/emotional/physical/sexual abuse as a child?: No Did patient suffer from severe childhood neglect?: No Has patient ever been sexually abused/assaulted/raped as an adolescent or adult?: No Was the patient ever a victim of a crime or a disaster?: No (War in [place] - Father died  as  a Office manager) Witnessed domestic violence?: No Has patient been effected by domestic violence as an adult?: No  CCA Part Two B  Employment/Work Situation: Employment / Work Psychologist, 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: Yes 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 Has patient ever been in the [employer]?: No Are There Guns or Other Weapons in Your Home?: No  Education: Education Did Theme park manager?: Yes What Type of College Degree Do you Have?: [employer] - for language classes [employer]  Did You Attend Graduate School?: No Did You Have Any Difficulty At [employer]?: No  Religion: Religion/Spirituality Are You A Religious Person?: Yes What is Your Religious Affiliation?: Buddhist How Might This Affect Treatment?: I pray I feel better  Leisure/Recreation: Leisure / Recreation Leisure and Hobbies: "Watch tv."  Exercise/Diet: Exercise/Diet Do You Exercise?: Yes (Before I was sick I ran on the machine, but now that I am sick I cannot walk.) Have You Gained or Lost A Significant Amount of Weight in the Past Six Months?: Yes-Lost Number of Pounds Lost?: 15 Do You Follow a Special Diet?: No Do You Have Any Trouble Sleeping?: Yes Explanation of Sleeping Difficulties: Trouble falling asleep and sleeping too much  CCA Part Two C  Alcohol/Drug Use: Alcohol / Drug Use Pain Medications: SEE MAR Prescriptions: SEE MAR Over the Counter: SEE MAR History of alcohol / drug use?: No history of alcohol / drug abuse                      CCA Part Three  ASAM's:  Six Dimensions of Multidimensional Assessment  Dimension 1:  Acute Intoxication and/or Withdrawal Potential:     Dimension 2:  Biomedical Conditions and Complications:     Dimension 3:  Emotional, Behavioral, or Cognitive Conditions and Complications:     Dimension 4:  Readiness to Change:  Dimension 5:  Relapse, Continued use, or Continued Problem Potential:     Dimension 6:  Recovery/Living Environment:      Substance use Disorder (SUD)    Social Function:  Social Functioning Social Maturity: Isolates Social Judgement: Victimized  Stress:  Stress Stressors: Illness, Money, Work (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 They tell me  I am okay "Why am I not getting better") Coping Ability: Exhausted, Overwhelmed Patient Takes Medications The Way The Doctor Instructed?: Yes Priority Risk: High Risk  Risk Assessment- Self-Harm Potential: Risk Assessment For Self-Harm Potential Thoughts of Self-Harm: Recurrent active thoughts Method: Plan without intent Availability of Means: Have close by  Risk Assessment -Dangerous to Others Potential: Risk Assessment For Dangerous to Others Potential Method: No Plan Availability of Means: No access or NA Intent: Vague intent or NA Notification Required: No need or identified person  DSM5 Diagnoses: Patient Active Problem List   Diagnosis Date Noted  . Adjustment disorder with mixed anxiety and depressed mood 12/26/2015  . Malnutrition (HCC)   . Intractable vomiting 12/23/2015  . Hypokalemia 11/11/2015  . Iron deficiency anemia 11/11/2015  . Malaise and fatigue 11/11/2015  . Failure to thrive in adult 11/11/2015  . Protein-calorie malnutrition, severe 11/11/2015  . Nausea with vomiting 11/11/2015  . Anxiety state   . Decreased oral intake   . Hypothyroidism 11/10/2015    Patient Centered Plan: Patient is on the following Treatment Plan(s): Client was referred to inpatient treatment Will return after completing treatment  Recommendations for Services/Supports/Treatments: Recommendations for Services/Supports/Treatments Recommendations For Services/Supports/Treatments: Inpatient Hospitalization, Medication Management  Treatment Plan Summary:    Referrals to Alternative Service(s): Referred to Alternative Service(s):   Place:   Date:   Time:    Referred to Alternative Service(s):   Place:   Date:   Time:    Referred to Alternative Service(s):   Place:   Date:   Time:    Referred to Alternative Service(s):   Place:   Date:   Time:     Rashawna Scoles A

## 2016-02-06 ENCOUNTER — Encounter (HOSPITAL_COMMUNITY): Payer: Self-pay | Admitting: *Deleted

## 2016-02-06 ENCOUNTER — Inpatient Hospital Stay (HOSPITAL_COMMUNITY)
Admission: AD | Admit: 2016-02-06 | Discharge: 2016-02-06 | DRG: 885 | Disposition: A | Discharge status: Home / Self Care | Payer: BLUE CROSS/BLUE SHIELD | Source: Intra-hospital | Attending: Psychiatry | Admitting: Psychiatry

## 2016-02-06 DIAGNOSIS — E039 Hypothyroidism, unspecified: Secondary | ICD-10-CM | POA: Diagnosis present

## 2016-02-06 DIAGNOSIS — Z888 Allergy status to other drugs, medicaments and biological substances status: Secondary | ICD-10-CM

## 2016-02-06 DIAGNOSIS — F429 Obsessive-compulsive disorder, unspecified: Secondary | ICD-10-CM | POA: Diagnosis not present

## 2016-02-06 DIAGNOSIS — Z91011 Allergy to milk products: Secondary | ICD-10-CM | POA: Diagnosis not present

## 2016-02-06 DIAGNOSIS — Z79899 Other long term (current) drug therapy: Secondary | ICD-10-CM | POA: Diagnosis not present

## 2016-02-06 DIAGNOSIS — F411 Generalized anxiety disorder: Secondary | ICD-10-CM | POA: Diagnosis present

## 2016-02-06 DIAGNOSIS — R45851 Suicidal ideations: Secondary | ICD-10-CM | POA: Diagnosis not present

## 2016-02-06 DIAGNOSIS — F332 Major depressive disorder, recurrent severe without psychotic features: Secondary | ICD-10-CM | POA: Diagnosis not present

## 2016-02-06 MED ORDER — SIMETHICONE 80 MG PO CHEW: 160.0000 mg | CHEWABLE_TABLET | Freq: Four times a day (QID) | ORAL | Status: DC | PRN

## 2016-02-06 MED ORDER — FLUOXETINE HCL 20 MG PO CAPS
20.0000 mg | ORAL_CAPSULE | Freq: Every day | ORAL | Status: DC
  Filled 2016-02-06 (×4): qty 1

## 2016-02-06 MED ORDER — MIRTAZAPINE 15 MG PO TABS: 15.0000 mg | ORAL_TABLET | Freq: Every day | ORAL | 0 refills | Status: DC

## 2016-02-06 MED ORDER — ALUM & MAG HYDROXIDE-SIMETH 200-200-20 MG/5ML PO SUSP: 30.0000 mL | ORAL | Status: DC | PRN

## 2016-02-06 MED ORDER — MAGNESIUM HYDROXIDE 400 MG/5ML PO SUSP: 30.0000 mL | Freq: Every day | ORAL | Status: DC | PRN

## 2016-02-06 MED ORDER — LEVOTHYROXINE SODIUM 100 MCG PO TABS: 100.0000 ug | ORAL_TABLET | Freq: Every day | ORAL | 0 refills | Status: DC

## 2016-02-06 MED ORDER — MIRTAZAPINE 15 MG PO TABS
15.0000 mg | ORAL_TABLET | Freq: Every evening | ORAL | Status: DC | PRN
  Filled 2016-02-06 (×4): qty 1

## 2016-02-06 MED ORDER — LORAZEPAM 0.5 MG PO TABS
0.5000 mg | ORAL_TABLET | Freq: Four times a day (QID) | ORAL | Status: DC | PRN
  Administered 2016-02-06: 0.5 mg via ORAL
  Filled 2016-02-06: qty 1

## 2016-02-06 MED ORDER — LORAZEPAM 1 MG PO TABS
1.0000 mg | ORAL_TABLET | Freq: Four times a day (QID) | ORAL | Status: DC | PRN
  Filled 2016-02-06: qty 1

## 2016-02-06 MED ORDER — ACETAMINOPHEN 325 MG PO TABS
650.0000 mg | ORAL_TABLET | Freq: Four times a day (QID) | ORAL | Status: DC | PRN
Start: 2016-02-06 — End: 2016-02-06

## 2016-02-06 MED ORDER — SIMETHICONE 80 MG PO CHEW: 160.0000 mg | CHEWABLE_TABLET | Freq: Four times a day (QID) | ORAL | 0 refills | Status: DC | PRN

## 2016-02-06 MED ORDER — POLYSACCHARIDE IRON COMPLEX 150 MG PO CAPS: 150.0000 mg | ORAL_CAPSULE | Freq: Two times a day (BID) | ORAL | 0 refills | Status: AC

## 2016-02-06 MED ORDER — POLYSACCHARIDE IRON COMPLEX 150 MG PO CAPS
150.0000 mg | ORAL_CAPSULE | Freq: Two times a day (BID) | ORAL | Status: DC
  Administered 2016-02-06: 150 mg via ORAL
  Filled 2016-02-06 (×5): qty 1

## 2016-02-06 MED ORDER — BOOST / RESOURCE BREEZE PO LIQD
1.0000 | Freq: Two times a day (BID) | ORAL | Status: DC
  Filled 2016-02-06 (×4): qty 1

## 2016-02-06 MED ORDER — ADULT MULTIVITAMIN W/MINERALS CH
1.0000 | ORAL_TABLET | Freq: Every day | ORAL | Status: DC
  Filled 2016-02-06 (×3): qty 1

## 2016-02-06 MED ORDER — ENSURE ENLIVE PO LIQD: 237.0000 mL | Freq: Two times a day (BID) | ORAL | Status: DC

## 2016-02-06 MED ORDER — MIRTAZAPINE 15 MG PO TABS
15.0000 mg | ORAL_TABLET | Freq: Every day | ORAL | Status: DC
  Filled 2016-02-06: qty 1

## 2016-02-06 MED ORDER — HALOPERIDOL 1 MG PO TABS: 2.0000 mg | ORAL_TABLET | Freq: Four times a day (QID) | ORAL | Status: DC | PRN

## 2016-02-06 MED ORDER — LEVOTHYROXINE SODIUM 100 MCG PO TABS
100.0000 ug | ORAL_TABLET | Freq: Every day | ORAL | Status: DC
  Administered 2016-02-06: 100 ug via ORAL
  Filled 2016-02-06 (×4): qty 1

## 2016-02-06 NOTE — [facility] Group Notes (Signed)
The focus of this group is to educate the patient on the purpose and policies of crisis stabilization and provide a format to answer questions about their admission.  The group details unit policies and expectations of patients while admitted.  Patient did not attend 0900 nurse education orientation group this morning.  Patient stayed in room.  

## 2016-02-06 NOTE — ED Notes (Signed)
Pt requesting simethecone.

## 2016-02-06 NOTE — [facility] Suicide Risk Assessment (Signed)
[facility] Discharge Suicide Risk Assessment   Principal Problem: <principal problem not specified> Discharge Diagnoses:  Patient Active Problem List   Diagnosis Date Noted  . MDD (major depressive disorder), recurrent episode, severe ([place]) [F33.2] 02/06/2016  . Adjustment disorder with mixed anxiety and depressed mood [F43.23] 12/26/2015  . Malnutrition ([place]) [E46]   . Intractable vomiting [R11.10] 12/23/2015  . Hypokalemia [E87.6] 11/11/2015  . Iron deficiency anemia [D50.9] 11/11/2015  . Malaise and fatigue [R53.81, R53.83] 11/11/2015  . Failure to thrive in adult [R62.7] 11/11/2015  . Protein-calorie malnutrition, severe [E43] 11/11/2015  . Nausea with vomiting [R11.2] 11/11/2015  . Anxiety state [F41.1]   . Decreased oral intake [R63.8]   . Hypothyroidism [E03.9] 11/10/2015    Total Time spent with patient: 30 minutes  Musculoskeletal: Strength & Muscle Tone: within normal limits Gait & Station: normal Patient leans: N/A  Psychiatric Specialty Exam: ROS  Blood pressure 138/85, pulse 93, temperature 97.8 F (36.6 C), temperature source Oral, resp. rate 18, height 5' (1.524 m), weight 41.3 kg (91 lb), SpO2 100 %.Body mass index is 17.77 kg/m.  General Appearance: Neat and calm and cooperative  Eye Contact::  Good  Speech:  soft spoken409  Volume:  Decreased  Mood:  Anxious and Depressed  Affect:  Appropriate and Restricted  Thought Process:  Coherent and Goal Directed  Orientation:  Full (Time, Place, and Person)  Thought Content:  No thoughts of violence. No hallucination  Suicidal Thoughts:  No  Homicidal Thoughts:  No  Memory:  Good  Judgement:  Good  Insight:  Good  Psychomotor Activity:  Decreased  Concentration:  Good  Recall:  Good  Fund of Knowledge:Good  Language: Good  Akathisia:  No  Handed:    AIMS (if indicated):     Assets:  Desire for Improvement Financial Resources/Insurance Housing Intimacy  Sleep:  Number of Hours: 2  Cognition: WNL  ADL's:   limited   Clinical Assessment::   56 yo Asian female, stable family background, no past history of mental illness. She has been feeling depressed since June. She has had a lot of somatic symptoms. Repeated investigation was negative. Patient has been less functional at home. 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 She has not been able to do most of the chores. She tends to stay in bed most of the time. Patient has been feeling guilty about this. She tries to clean but not able to persistently complete tasks. She has been started on Mirtazapine 7.5 mg. She has been tolerating this well. Patient denies any thoughts of suicide. She has never attempted suicide in the past. Says she has never contemplated suicide. Patient states that she wants to get better. She has good support at home. No substance use. No evidence of mania. No evidence of psychosis. No obsession or compulsion. No weapons in the house. No thoughts of violence.  I met with her husband. He collaborated her story. Husband does not have any concerns with regards to suicidality. Her husband wants her home today. They clearly understand treatment plan designated below.   Demographic Factors:  NA  Loss Factors: NA  Historical Factors: NA  Risk Reduction Factors:   Sense of responsibility to family, Living with another person, especially a relative, Positive social support, Positive therapeutic relationship and Positive coping skills or problem solving skills  Continued Clinical Symptoms:  As above  Cognitive Features That Contribute To Risk:  None    Suicide Risk:  Minimal: No identifiable suicidal  ideation.  Patients presenting with no risk factors but with morbid ruminations; may be classified as minimal risk based on the severity of the depressive symptoms  Follow-up Information    Dr. Reynold Bowen MD Follow up on 02/19/2016.   Why:  You have follow-up/medication management appt scheduled for this day at  2:30PM Contact information: [place], [place] [address] Phone: [phone] Fax: [phone]          Plan Of Care/Follow-up recommendations:   Patient has a supportive husband and two grown children. She has been started on Mirtazapine. We agreed to increase her dose to 15 mg HS She would follow up with her psychiatrist/PCP. Goal is to increase towards antidepressant/antianxiety dose and wean of Ativan in a couple of weeks.  Artist [place], MD 02/06/2016, 3:01 PM

## 2016-02-06 NOTE — H&P (Signed)
Psychiatric Admission Assessment Adult  Patient Identification: Lauren Bolton MRN:  [id number] Date of Evaluation:  02/06/2016 Chief Complaint:  MDD REC SEV WITHOUT PSYCHOSIS Principal Diagnosis: <principal problem not specified> Diagnosis:   Patient Active Problem List   Diagnosis Date Noted  . MDD (major depressive disorder), recurrent episode, severe ([place]) [F33.2] 02/06/2016  . Adjustment disorder with mixed anxiety and depressed mood [F43.23] 12/26/2015  . Malnutrition ([place]) [E46]   . Intractable vomiting [R11.10] 12/23/2015  . Hypokalemia [E87.6] 11/11/2015  . Iron deficiency anemia [D50.9] 11/11/2015  . Malaise and fatigue [R53.81, R53.83] 11/11/2015  . Failure to thrive in adult [R62.7] 11/11/2015  . Protein-calorie malnutrition, severe [E43] 11/11/2015  . Nausea with vomiting [R11.2] 11/11/2015  . Anxiety state [F41.1]   . Decreased oral intake [R63.8]   . Hypothyroidism [E03.9] 11/10/2015   History of Present Illness:  According to  [facility] TTS note:  Lauren Bolton is a 56 y.o. married female who presents to  [facility] for medical clearance prior to psychiatric admission.  She is established with a therapist and with [facility].  Per chart review, patient has diagnosis of depression and OCD.  Patient is alert and oriented.  Anxious.   Patient has extensive history of GI symptoms such as poor appetite, nausea and vomiting.  Per chart review, extensive imaging studies to include CT scans and Ba swallow were negative and normal.  Upon admission, she was just seen by Dr Modesta Messing presenting with with rumination of somatic symptoms, anticipatory anxiety and suicidal ideation with plan to take medication.  She was sent to ED for further exam and stabilization.  It was reported that patient believe food to be contaminated, obsessed with checking on doors and stoves, constant handwashing.  However, she was clear when she articulated, " it is normal to wash hands due  to germs, to check if lights and stive tops are off before leaving house and it is normal to throw out water that has been used for washing, because it is dirty."    Patient's husband reports she has been to the emergency department 6-7 times in the past few months due to somatic complaints and anxiety. Pt denies any history of suicide attempts. Pt denies homicidal ideation or history of violence. She denies any auditory or visual hallucinations. She denies any history of alcohol or substance abuse.    Associated Signs/Symptoms: Depression Symptoms:  difficulty concentrating, anxiety, disturbed sleep, decreased appetite, (Hypo) Manic Symptoms:  NA Anxiety Symptoms:  Excessive Worry, Psychotic Symptoms:  Paranoia, PTSD Symptoms: NA Total Time spent with patient: 45 minutes  Past Psychiatric History: see HPI  Is the patient at risk to self? No.  Has the patient been a risk to self in the past 6 months? No.  Has the patient been a risk to self within the distant past? No.  Is the patient a risk to others? No.  Has the patient been a risk to others in the past 6 months? No.  Has the patient been a risk to others within the distant past? No.   Prior Inpatient Therapy:   Prior Outpatient Therapy:    Alcohol Screening: 1. How often do you have a drink containing alcohol?: Never 2. How many drinks containing alcohol do you have on a typical day when you are drinking?: 1 or 2 (does not drink alcohol) 3. How often do you have six or more drinks on one occasion?: Never Preliminary Score: 0 4. How often during the  last year have you found that you were not able to stop drinking once you had started?: Never 5. How often during the last year have you failed to do what was normally expected from you becasue of drinking?: Never 6. How often during the last year have you needed a first drink in the morning to get yourself going after a heavy drinking session?: Never 7. How often during the last  year have you had a feeling of guilt of remorse after drinking?: Never 8. How often during the last year have you been unable to remember what happened the night before because you had been drinking?: Never 9. Have you or someone else been injured as a result of your drinking?: No 10. Has a relative or friend or a doctor or another health worker been concerned about your drinking or suggested you cut down?: No Alcohol Use Disorder Identification Test Final Score (AUDIT): 0 Brief Intervention: AUDIT score less than 7 or less-screening does not suggest unhealthy drinking-brief intervention not indicated Substance Abuse History in the last 12 months:  No. Consequences of Substance Abuse: NA Previous Psychotropic Medications: Yes  Psychological Evaluations: Yes  Past Medical History:  Past Medical History:  Diagnosis Date  . Iron deficiency anemia   . Thyroid disease     Past Surgical History:  Procedure Laterality Date  . ESOPHAGOGASTRODUODENOSCOPY N/A 11/13/2015   Procedure: ESOPHAGOGASTRODUODENOSCOPY (EGD);  Surgeon: Teena Irani, MD;  Location: [facility];  Service: Endoscopy;  Laterality: N/A;  . ESOPHAGOGASTRODUODENOSCOPY (EGD) WITH PROPOFOL N/A 12/26/2015   Procedure: ESOPHAGOGASTRODUODENOSCOPY (EGD) WITH PROPOFOL;  Surgeon: Ronald Lobo, MD;  Location: [facility] END[facility]COPY;  Service: Endoscopy;  Laterality: N/A;  . PLACEMENT OF BREAST IMPLANTS     20 years ago  . thyroid goiter     Family History: History reviewed. No pertinent family history. Family Psychiatric  History: see HPI Tobacco Screening: Have you used any form of tobacco in the last 30 days? (Cigarettes, Smokeless Tobacco, Cigars, and/or Pipes): No Social History: Patient orginally from [place], speaks limited English, needs translation assistance.  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  History  Alcohol Use No     History   Drug Use No    Additional Social History:      Pain Medications: See home med list Prescriptions: See home med list Over the Counter: See home med list History of alcohol / drug use?: No history of alcohol / drug abuse                    Allergies:   Allergies  Allergen Reactions  . Benadryl [Diphenhydramine Hcl] Diarrhea and Other (See Comments)    Reaction:  Makes her excessively sleepy   . Lactose Intolerance (Gi)    Lab Results:  Results for orders placed or performed during the hospital encounter of 02/05/16 (from the past 48 hour(s))  CBC with Differential     Status: Abnormal   Collection Time: 02/05/16  5:38 PM  Result Value Ref Range   WBC 7.0 4.0 - 10.5 K/uL   RBC 4.46 3.87 - 5.11 MIL/uL   Hemoglobin 10.9 (L) 12.0 - 15.0 g/dL   HCT 33.2 (L) 36.0 - 46.0 %   MCV 74.4 (L) 78.0 - 100.0 fL   MCH 24.4 (L) 26.0 - 34.0 pg   MCHC 32.8 30.0 - 36.0 g/dL   RDW 13.8 11.5 -  15.5 %   Platelets 190 150 - 400 K/uL   Neutrophils Relative % 71 %   Neutro Abs 5.0 1.7 - 7.7 K/uL   Lymphocytes Relative 23 %   Lymphs Abs 1.6 0.7 - 4.0 K/uL   Monocytes Relative 5 %   Monocytes Absolute 0.4 0.1 - 1.0 K/uL   Eosinophils Relative 1 %   Eosinophils Absolute 0.1 0.0 - 0.7 K/uL   Basophils Relative 0 %   Basophils Absolute 0.0 0.0 - 0.1 K/uL  Comprehensive metabolic panel     Status: Abnormal   Collection Time: 02/05/16  5:38 PM  Result Value Ref Range   Sodium 140 135 - 145 mmol/L   Potassium 3.7 3.5 - 5.1 mmol/L   Chloride 104 101 - 111 mmol/L   CO2 27 22 - 32 mmol/L   Glucose, Bld 109 (H) 65 - 99 mg/dL   BUN 11 6 - 20 mg/dL   Creatinine, Ser 4.47 0.44 - 1.00 mg/dL   Calcium 9.2 8.9 - 15.8 mg/dL   Total Protein 7.7 6.5 - 8.1 g/dL   Albumin 4.3 3.5 - 5.0 g/dL   AST 27 15 - 41 U/L   ALT 24 14 - 54 U/L   Alkaline Phosphatase 63 38 - 126 U/L   Total Bilirubin 0.6 0.3 - 1.2 mg/dL   GFR calc non Af Amer >60 >60 mL/min   GFR calc Af Amer >60 >60 mL/min    Comment:  (NOTE) The eGFR has been calculated using the CKD EPI equation. This calculation has not been validated in all clinical situations. eGFR's persistently <60 mL/min signify possible Chronic Kidney Disease.    Anion gap 9 5 - 15  Ethanol     Status: None   Collection Time: 02/05/16  5:38 PM  Result Value Ref Range   Alcohol, Ethyl (B) <5 <5 mg/dL    Comment:        LOWEST DETECTABLE LIMIT FOR SERUM ALCOHOL IS 5 mg/dL FOR MEDICAL PURPOSES ONLY   Acetaminophen level     Status: Abnormal   Collection Time: 02/05/16  5:38 PM  Result Value Ref Range   Acetaminophen (Tylenol), Serum <10 (L) 10 - 30 ug/mL    Comment:        THERAPEUTIC CONCENTRATIONS VARY SIGNIFICANTLY. A RANGE OF 10-30 ug/mL MAY BE AN EFFECTIVE CONCENTRATION FOR MANY PATIENTS. HOWEVER, SOME ARE BEST TREATED AT CONCENTRATIONS OUTSIDE THIS RANGE. ACETAMINOPHEN CONCENTRATIONS >150 ug/mL AT 4 HOURS AFTER INGESTION AND >50 ug/mL AT 12 HOURS AFTER INGESTION ARE OFTEN ASSOCIATED WITH TOXIC REACTIONS.   Salicylate level     Status: None   Collection Time: 02/05/16  5:38 PM  Result Value Ref Range   Salicylate Lvl <7.0 2.8 - 30.0 mg/dL  Rapid urine drug screen (hospital performed)     Status: Abnormal   Collection Time: 02/05/16  5:44 PM  Result Value Ref Range   Opiates NONE DETECTED NONE DETECTED   Cocaine NONE DETECTED NONE DETECTED   Benzodiazepines POSITIVE (A) NONE DETECTED   Amphetamines NONE DETECTED NONE DETECTED   Tetrahydrocannabinol NONE DETECTED NONE DETECTED   Barbiturates NONE DETECTED NONE DETECTED    Comment:        DRUG SCREEN FOR MEDICAL PURPOSES ONLY.  IF CONFIRMATION IS NEEDED FOR ANY PURPOSE, NOTIFY LAB WITHIN 5 DAYS.        LOWEST DETECTABLE LIMITS FOR URINE DRUG SCREEN Drug Class       Cutoff (ng/mL) Amphetamine      1000 Barbiturate  200 Benzodiazepine   832 Tricyclics       919 Opiates          300 Cocaine          300 THC              50     Blood Alcohol level:  Lab  Results  Component Value Date   ETH <5 02/05/2016   ETH <5 16/60/6004    Metabolic Disorder Labs:  No results found for: HGBA1C, MPG No results found for: PROLACTIN No results found for: CHOL, TRIG, HDL, CHOLHDL, VLDL, LDLCALC  Current Medications: Current Facility-Administered Medications  Medication Dose Route Frequency Provider Last Rate Last Dose  . acetaminophen (TYLENOL) tablet 650 mg  650 mg Oral Q6H PRN Laverle Hobby, PA-C      . alum & mag hydroxide-simeth (MAALOX/MYLANTA) 200-200-20 MG/5ML suspension 30 mL  30 mL Oral Q4H PRN Laverle Hobby, PA-C      . FLUoxetine (PROZAC) capsule 20 mg  20 mg Oral Daily Laverle Hobby, PA-C      . haloperidol (HALDOL) tablet 2 mg  2 mg Oral Q6H PRN Laverle Hobby, PA-C      . iron polysaccharides (NIFEREX) capsule 150 mg  150 mg Oral BID Laverle Hobby, PA-C      . levothyroxine (SYNTHROID, LEVOTHROID) tablet 100 mcg  100 mcg Oral QAC breakfast Laverle Hobby, PA-C   100 mcg at 02/06/16 0650  . LORazepam (ATIVAN) tablet 1 mg  1 mg Oral Q6H PRN Laverle Hobby, PA-C      . magnesium hydroxide (MILK OF MAGNESIA) suspension 30 mL  30 mL Oral Daily PRN Laverle Hobby, PA-C      . mirtazapine (REMERON) tablet 15 mg  15 mg Oral QHS,MR X 1 Spencer E Simon, PA-C      . simethicone (MYLICON) chewable tablet 160 mg  160 mg Oral QID PRN Laverle Hobby, PA-C       PTA Medications: Prescriptions Prior to Admission  Medication Sig Dispense Refill Last Dose  . calcium carbonate (TUMS - DOSED IN MG ELEMENTAL CALCIUM) 500 MG chewable tablet Chew 1 tablet by mouth as needed for indigestion or heartburn.   unknown  . docusate sodium (COLACE) 100 MG capsule Take 100 mg by mouth daily as needed for mild constipation or moderate constipation.   unknown  . iron polysaccharides (NIFEREX) 150 MG capsule Take 150 mg by mouth 2 (two) times daily.   02/05/2016 at Unknown time  . levothyroxine (SYNTHROID) 100 MCG tablet Take 1 tablet (100 mcg total) by mouth  daily before breakfast.   02/05/2016 at Unknown time  . LORazepam (ATIVAN) 0.5 MG tablet Take 0.5 mg by mouth 2 (two) times daily as needed for anxiety.   02/05/2016 at Unknown time  . mirtazapine (REMERON) 7.5 MG tablet Take 1 tablet (7.5 mg total) by mouth at bedtime. 30 tablet 0 02/04/2016 at Unknown time  . simethicone (MYLICON) 80 MG chewable tablet Chew 2 tablets (160 mg total) by mouth 4 (four) times daily as needed for flatulence. 60 tablet 0 02/05/2016 at Unknown time  . Amino Acids-Protein Hydrolys (FEEDING SUPPLEMENT, PRO-STAT SUGAR FREE 64,) LIQD Take 30 mLs by mouth 3 (three) times daily between meals. (Patient not taking: Reported on 02/05/2016) 900 mL 0 Not Taking at Unknown time  . omeprazole (PRILOSEC) 40 MG capsule Take 1 capsule (40 mg total) by mouth every other day. Take every other day over  the next 2 weeks, then stop (Patient not taking: Reported on 02/05/2016)  0 Not Taking at Unknown time  . ondansetron (ZOFRAN ODT) 8 MG disintegrating tablet Take 1 tablet (8 mg total) by mouth every 8 (eight) hours as needed. (Patient not taking: Reported on 02/05/2016) 10 tablet 0 Not Taking at Unknown time    Musculoskeletal: Strength & Muscle Tone: within normal limits Gait & Station: normal Patient leans: N/A  Psychiatric Specialty Exam: Physical Exam  Nursing note and vitals reviewed.   ROS  Blood pressure 138/85, pulse 93, temperature 97.8 F (36.6 C), temperature source Oral, resp. rate 18, height 5' (1.524 m), weight 41.3 kg (91 lb), SpO2 100 %.Body mass index is 17.77 kg/m.  General Appearance: Casual  Eye Contact:  Good  Speech:  Clear and Coherent  Volume:  Normal  Mood:  Anxious  Affect:  Appropriate  Thought Process:  Coherent  Orientation:  Full (Time, Place, and Person)  Thought Content:  Logical  Suicidal Thoughts:  No  Homicidal Thoughts:  No  Memory:  Immediate;   Fair Recent;   Fair Remote;   Fair  Judgement:  Fair  Insight:  Fair  Psychomotor Activity:   Normal  Concentration:  Concentration: Fair and Attention Span: Fair  Recall:  [employer] of Knowledge:  Fair  Language:  Fair  Akathisia:  No  Handed:  Right  AIMS (if indicated):     Assets:  Desire for Improvement Financial Resources/Insurance Resilience  ADL's:  Intact  Cognition:  WNL  Sleep:  Number of Hours: 2   Treatment Plan Summary: Admit for crisis management and mood stabilization. Medication management to re-stabilize current mood symptoms Group counseling sessions for coping skills Medical consults as needed Review and reinstate any pertinent home medications for other health problems   Observation Level/Precautions:  15 minute checks  Laboratory:  per ED  Psychotherapy:  group  Medications:  As per medlist  Consultations:  As needed  Discharge Concerns:  safety  Estimated LOS:  2-7 days  Other:     Physician Treatment Plan for Primary Diagnosis: <principal problem not specified> [place] Term Goal(s): Improvement in symptoms so as ready for discharge  Short Term Goals: Ability to identify changes in lifestyle to reduce recurrence of condition will improve, Ability to verbalize feelings will improve, Ability to demonstrate self-control will improve, Ability to identify and develop effective coping behaviors will improve, Ability to maintain clinical measurements within normal limits will improve, Compliance with prescribed medications will improve and Ability to identify triggers associated with substance abuse/mental health issues will improve  Physician Treatment Plan for Secondary Diagnosis: Active Problems:   MDD (major depressive disorder), recurrent episode, severe ([place])  [place] Term Goal(s): Improvement in symptoms so as ready for discharge  Short Term Goals: Ability to identify changes in lifestyle to reduce recurrence of condition will improve, Ability to verbalize feelings will improve, Ability to demonstrate self-control will improve, Ability to identify  and develop effective coping behaviors will improve, Ability to maintain clinical measurements within normal limits will improve, Compliance with prescribed medications will improve and Ability to identify triggers associated with substance abuse/mental health issues will improve  I certify that inpatient services furnished can reasonably be expected to improve the patient's condition.    Janett Labella, NP BB 2/1/20189:43 AM

## 2016-02-06 NOTE — Tx Team (Signed)
Initial Treatment Plan 02/06/2016 3:09 AM Lauren Bolton [id number]    PATIENT STRESSORS: Health problems Occupational concerns   PATIENT STRENGTHS: Average or above average intelligence Capable of independent living General fund of knowledge Motivation for treatment/growth Religious Affiliation Supportive family/friends   PATIENT IDENTIFIED PROBLEMS: Anxiety/Depression  OCD behaviors that started around August 2017  Decreased appetite and chronic nausea  Uncontrollable fear such as fear of dying or food poison  Hopelessness    "I want help to get better; feel so anxious all the time"  "Feel weak from not eating; need to be able to eat"       DISCHARGE CRITERIA:  Ability to meet basic life and health needs Improved stabilization in mood, thinking, and/or behavior Medical problems require only outpatient monitoring Motivation to continue treatment in a less acute level of care Verbal commitment to aftercare and medication compliance  PRELIMINARY DISCHARGE PLAN: Attend aftercare/continuing care group Outpatient therapy Participate in family therapy Return to previous living arrangement  PATIENT/FAMILY INVOLVEMENT: This treatment plan has been presented to and reviewed with the patient, Lauren Bolton, and/or family member.  The patient and family have been given the opportunity to ask questions and make suggestions.  Charlott HollerSpeagle, Jeyli Zwicker Church, RN 02/06/2016, 3:09 AM

## 2016-02-06 NOTE — [facility] Suicide Risk Assessment (Signed)
[facility] INPATIENT:  Family/Significant Other Suicide Prevention Education  Suicide Prevention Education:  Contact Attempts: Rithy Wilborn (pt's husband) [phone] has been identified by the patient as the family member/significant other with whom the patient will be residing, and identified as the person(s) who will aid the patient in the event of a mental health crisis.  With written consent from the patient, two attempts were made to provide suicide prevention education, prior to and/or following the patient's discharge.  We were unsuccessful in providing suicide prevention education.  A suicide education pamphlet was given to the patient to share with family/significant other.  Date and time of first attempt: 02/06/16 at 1;35PM (no answer/unsecure line/unable to leave voicemail).   Kimber Relic Smart LCSW 02/06/2016, 1:38 PM   CSW met with pt's husband in person when he came to deliver patient food. He reports no concerns regarding pt returning home or SI. Pt's husband would like to take her home this evening and reviewed SPE with CSW. He reports that pt sees Dr. Forde Dandy PCP for medication management and would like to keep her current appt for Feb 14th at [place] also agreeable.   Maxie Better, MSW, LCSW Clinical Social Worker 02/06/2016 2:31 PM

## 2016-02-06 NOTE — Progress Notes (Signed)
Vol admit, 56 yo Asian female, admitted after referral by her therapist.  [facility]  She presented as a walk-in and was sent for medical clearance.  Pt is being treated for depression and anxiety with OCD behaviors which are progressing.  She has lost about 8-9 lbs recently because her appetite has been poor, and she is only eating mostly rice.  She said sometimes her husband will put some chicken and broth in the rice, but she says that nausea is preventing her from eating much at one time.  She says she has been very weak and tired from not eating.  She says these behaviors really began around August 2017 and have increased since then.  She says she is afraid of so many things, and finds herself checking things in the house multiple times.  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  Pt says it is difficult for her to function anymore.  Pt was cooperative with the admission process.  Pt states she knows some [place], but cannot read [place].  She would benefit from an interpreter during group times.  Pt denies SA.  She was positive for benzos, but she takes Ativan at home prn.  Search was done and paperwork was signed.  Pt's skin was clear and unremarkable.  Pt declined any of the choices of snack that was on the unit at the time of admission.  Pt was oriented to the unit and her room.  Pt had received meds at the ED [facility].  Safety checks q15 minutes were initiated.

## 2016-02-06 NOTE — Progress Notes (Signed)
Patient has been anxious this morning.  NP stated to give patient ativan 1 mg.  Patient refused after ativan 1 mg was taken out of the pyxis.  Patient stated she takes ativan 0.5 mg at home.  MD walked by med window.  Nurse/patient talked to MD about ativan 0.5 mg.  MD stated to change med order to ativan 0.5 mg.  Ativan 1 mg was wasted in sharps container per instructions from pharmacist.  RN Elby Beckoni Waller witnessed this nurse dispose of ativan 1 mg in sharps container in the 300 med room.

## 2016-02-06 NOTE — Tx Team (Signed)
Interdisciplinary Treatment and Diagnostic Plan Update  02/06/2016 Time of Session: 9:30AM Lauren Bolton MRN: [id number]  Principal Diagnosis: MDD severe without psychosis   Secondary Diagnoses: Active Problems:   MDD (major depressive disorder), recurrent episode, severe (HCC)   Current Medications:  Current Facility-Administered Medications  Medication Dose Route Frequency Provider Last Rate Last Dose  . acetaminophen (TYLENOL) tablet 650 mg  650 mg Oral Q6H PRN Kerry Hough, PA-C      . alum & mag hydroxide-simeth (MAALOX/MYLANTA) 200-200-20 MG/5ML suspension 30 mL  30 mL Oral Q4H PRN Kerry Hough, PA-C      . feeding supplement (BOOST / RESOURCE BREEZE) liquid 1 Container  1 Container Oral BID WC Fernando A Cobos, MD      . feeding supplement (ENSURE ENLIVE) (ENSURE ENLIVE) liquid 237 mL  237 mL Oral BID BM Rockey Situ Cobos, MD      . FLUoxetine (PROZAC) capsule 20 mg  20 mg Oral Daily Kerry Hough, PA-C      . haloperidol (HALDOL) tablet 2 mg  2 mg Oral Q6H PRN Kerry Hough, PA-C      . iron polysaccharides (NIFEREX) capsule 150 mg  150 mg Oral BID Kerry Hough, PA-C   150 mg at 02/06/16 1242  . levothyroxine (SYNTHROID, LEVOTHROID) tablet 100 mcg  100 mcg Oral QAC breakfast Kerry Hough, PA-C   100 mcg at 02/06/16 9562  . LORazepam (ATIVAN) tablet 0.5 mg  0.5 mg Oral Q6H PRN Georgiann Cocker, MD   0.5 mg at 02/06/16 1316  . magnesium hydroxide (MILK OF MAGNESIA) suspension 30 mL  30 mL Oral Daily PRN Kerry Hough, PA-C      . mirtazapine (REMERON) tablet 15 mg  15 mg Oral QHS,MR X 1 Spencer E Simon, PA-C      . multivitamin with minerals tablet 1 tablet  1 tablet Oral Daily Rockey Situ Cobos, MD      . simethicone (MYLICON) chewable tablet 160 mg  160 mg Oral QID PRN Kerry Hough, PA-C       PTA Medications: Prescriptions Prior to Admission  Medication Sig Dispense Refill Last Dose  . calcium carbonate (TUMS - DOSED IN MG ELEMENTAL CALCIUM) 500 MG chewable  tablet Chew 1 tablet by mouth as needed for indigestion or heartburn.   unknown  . docusate sodium (COLACE) 100 MG capsule Take 100 mg by mouth daily as needed for mild constipation or moderate constipation.   unknown  . iron polysaccharides (NIFEREX) 150 MG capsule Take 150 mg by mouth 2 (two) times daily.   02/05/2016 at Unknown time  . levothyroxine (SYNTHROID) 100 MCG tablet Take 1 tablet (100 mcg total) by mouth daily before breakfast.   02/05/2016 at Unknown time  . LORazepam (ATIVAN) 0.5 MG tablet Take 0.5 mg by mouth 2 (two) times daily as needed for anxiety.   02/05/2016 at Unknown time  . mirtazapine (REMERON) 7.5 MG tablet Take 1 tablet (7.5 mg total) by mouth at bedtime. 30 tablet 0 02/04/2016 at Unknown time  . simethicone (MYLICON) 80 MG chewable tablet Chew 2 tablets (160 mg total) by mouth 4 (four) times daily as needed for flatulence. 60 tablet 0 02/05/2016 at Unknown time  . Amino Acids-Protein Hydrolys (FEEDING SUPPLEMENT, PRO-STAT SUGAR FREE 64,) LIQD Take 30 mLs by mouth 3 (three) times daily between meals. (Patient not taking: Reported on 02/05/2016) 900 mL 0 Not Taking at Unknown time  . omeprazole (PRILOSEC) 40 MG capsule Take  1 capsule (40 mg total) by mouth every other day. Take every other day over the next 2 weeks, then stop (Patient not taking: Reported on 02/05/2016)  0 Not Taking at Unknown time  . ondansetron (ZOFRAN ODT) 8 MG disintegrating tablet Take 1 tablet (8 mg total) by mouth every 8 (eight) hours as needed. (Patient not taking: Reported on 02/05/2016) 10 tablet 0 Not Taking at Unknown time    Patient Stressors: Health problems Occupational concerns  Patient Strengths: Average or above average intelligence Capable of independent living General fund of knowledge Motivation for treatment/growth Religious Affiliation Supportive family/friends  Treatment Modalities: Medication Management, Group therapy, Case management,  1 to 1 session with clinician,  Psychoeducation, Recreational therapy.   Physician Treatment Plan for Primary Diagnosis: MDD severe without psychosis [place] Term Goal(s):     Short Term Goals:    Medication Management: Evaluate patient's response, side effects, and tolerance of medication regimen.  Therapeutic Interventions: 1 to 1 sessions, Unit Group sessions and Medication administration.  Evaluation of Outcomes: Progressing  Physician Treatment Plan for Secondary Diagnosis: Active Problems:   MDD (major depressive disorder), recurrent episode, severe (HCC)  [place] Term Goal(s):     Short Term Goals:       Medication Management: Evaluate patient's response, side effects, and tolerance of medication regimen.  Therapeutic Interventions: 1 to 1 sessions, Unit Group sessions and Medication administration.  Evaluation of Outcomes: Progressing   RN Treatment Plan for Primary Diagnosis: MDD recurrent severe without psychosis [place] Term Goal(s): Knowledge of disease and therapeutic regimen to maintain health will improve  Short Term Goals: Ability to remain free from injury will improve, Ability to verbalize feelings will improve and Ability to disclose and discuss suicidal ideas  Medication Management: RN will administer medications as ordered by provider, will assess and evaluate patient's response and provide education to patient for prescribed medication. RN will report any adverse and/or side effects to prescribing provider.  Therapeutic Interventions: 1 on 1 counseling sessions, Psychoeducation, Medication administration, Evaluate responses to treatment, Monitor vital signs and CBGs as ordered, Perform/monitor CIWA, COWS, AIMS and Fall Risk screenings as ordered, Perform wound care treatments as ordered.  Evaluation of Outcomes: Progressing   LCSW Treatment Plan for Primary Diagnosis: MDD recurrent severe without psychosis Name Term Goal(s): Safe transition to appropriate next level of care at discharge, Engage  patient in therapeutic group addressing interpersonal concerns.  Short Term Goals: Engage patient in aftercare planning with referrals and resources, Facilitate patient progression through stages of change regarding substance use diagnoses and concerns and Identify triggers associated with mental health/substance abuse issues  Therapeutic Interventions: Assess for all discharge needs, 1 to 1 time with Social worker, Explore available resources and support systems, Assess for adequacy in community support network, Educate family and significant other(s) on suicide prevention, Complete Psychosocial Assessment, Interpersonal group therapy.  Evaluation of Outcomes: Progressing   Progress in Treatment: Attending groups: No. New to unit. Continuing to assess.  Participating in groups: No. Taking medication as prescribed: Yes. Toleration medication: Yes. Family/Significant other contact made: No, will contact:  husband if patient consents Patient understands diagnosis: Yes. Discussing patient identified problems/goals with staff: Yes. Medical problems stabilized or resolved: Yes. Denies suicidal/homicidal ideation: Yes Issues/concerns per patient self-inventory: No. Other: n/a  New problem(s) identified: No, Describe:  n/a  New Short Term/[place] Term Goal(s): mood stabilization; development of comprehensive mental wellness plan.   Discharge Plan or Barriers: CSW assessing. Pt sees therapist at [facility] o/p clinic and psychiatrist--prescribed  ativan for anxiety.   Reason for Continuation of Hospitalization: Depression Medication stabilization  OCD Symptoms  Estimated Length of Stay: 1-3 days   Attendees: Patient: 02/06/2016 2:31 PM  Physician: Dr. Jackquline BerlinIzediuno MD 02/06/2016 2:31 PM  Nursing: Meriam SpragueBeverly RN; Roni RN 02/06/2016 2:31 PM  RN Care Manager: Onnie BoerJennifer Clark CM 02/06/2016 2:31 PM  Social Worker: Trula SladeHeather Smart, LCSW 02/06/2016 2:31 PM  Recreational Therapist:  02/06/2016 2:31 PM  Other:  Armandina StammerAgnes Nwoko NP; Gray BernhardtMay Augustin NP 02/06/2016 2:31 PM  Other:  02/06/2016 2:31 PM  Other: 02/06/2016 2:31 PM    Scribe for Treatment Team: Ledell PeoplesHeather N Smart, LCSW 02/06/2016 2:31 PM

## 2016-02-06 NOTE — Progress Notes (Signed)
D:  Patient's self inventory sheet, patient has poor sleep, no sleep medication given.  Poor appetite, low energy level, poor concentration.  Patient denied depression and hopeless, rated anxiety 10.  Denied withdrawals.  Denied SI.  Denied pain.  Goal is to discharge home.  Plans to talk to MD/SW. A:  Patient refused prozac 20 mg this morning.  Patient also refused ativan 1 mg prn this morning and MD ordered ativan 0.5 mg for her.  Patient also refused ensure.   R:  Patient denied SI and HI, contracts for safety.  Denied A/V hallucinations.  Safety maintained with 15 minute checks. Patient's husband came and brought her lunch today.  Patient's husband talked to SW and MD about patient's diet and status.

## 2016-02-06 NOTE — ED Notes (Addendum)
Attempted to call report.  Informed by Delorise Jacksonori, bed not ready until approx MN & will call back when ready.

## 2016-02-06 NOTE — Progress Notes (Signed)
  [facility] Discharge [facility] :  Will you be returning to the same living situation after discharge:  Yes,  home with husband At discharge, do you have transportation home?: Yes,  husband Do you have the ability to pay for your medications: Yes,  [employer] insurance  Release of information consent forms completed and submitted to medical records by CSW.  Patient to Follow up at: Follow-up Information    Dr. Adrian PrinceStephen South MD Follow up on 02/19/2016.   Why:  You have follow-up/medication management appt scheduled for this day at 2:30PM Contact information: [address] [place], [place] [address] Phone: [phone] Fax: [phone]          Next level of care provider has access to [facility] Link:no  Safety Planning and Suicide Prevention discussed: Yes,  SPE completed with pt's husband.  Have you used any form of tobacco in the last 30 days? (Cigarettes, Smokeless Tobacco, Cigars, and/or Pipes): No  Has patient been referred to the Quitline?: N/A patient is not a smoker  Patient has been referred for addiction treatment: Yes  Ayomide Purdy N Smart LCSW 02/06/2016, 3:02 PM

## 2016-02-06 NOTE — Discharge Summary (Signed)
Physician Discharge Summary Note  Patient:  Lauren Bolton is an 56 y.o., female MRN:  [id number] DOB:  [birth year] Patient phone:  [phone] (home)  Patient address:   [address] [place] [address],  Total Time spent with patient: 30 minutes  Date of Admission:  02/06/2016 Date of Discharge: 02/07/2016  Reason for Admission:  Poor appetite, depression  Principal Problem: MDD (major depressive disorder), recurrent episode, severe [facility]) Discharge Diagnoses: Patient Active Problem List   Diagnosis Date Noted  . MDD (major depressive disorder), recurrent episode, severe ([place]) [F33.2] 02/06/2016    Priority: High  . Adjustment disorder with mixed anxiety and depressed mood [F43.23] 12/26/2015  . Malnutrition ([place]) [E46]   . Intractable vomiting [R11.10] 12/23/2015  . Hypokalemia [E87.6] 11/11/2015  . Iron deficiency anemia [D50.9] 11/11/2015  . Malaise and fatigue [R53.81, R53.83] 11/11/2015  . Failure to thrive in adult [R62.7] 11/11/2015  . Protein-calorie malnutrition, severe [E43] 11/11/2015  . Nausea with vomiting [R11.2] 11/11/2015  . Anxiety state [F41.1]   . Decreased oral intake [R63.8]   . Hypothyroidism [E03.9] 11/10/2015    Past Psychiatric History: see HPI  Past Medical History:  Past Medical History:  Diagnosis Date  . Iron deficiency anemia   . Thyroid disease     Past Surgical History:  Procedure Laterality Date  . ESOPHAGOGASTRODUODENOSCOPY N/A 11/13/2015   Procedure: ESOPHAGOGASTRODUODENOSCOPY (EGD);  Surgeon: Teena Irani, MD;  Location: [facility];  Service: Endoscopy;  Laterality: N/A;  . ESOPHAGOGASTRODUODENOSCOPY (EGD) WITH PROPOFOL N/A 12/26/2015   Procedure: ESOPHAGOGASTRODUODENOSCOPY (EGD) WITH PROPOFOL;  Surgeon: Ronald Lobo, MD;  Location: [facility]COPY;  Service: Endoscopy;  Laterality: N/A;  . PLACEMENT OF BREAST IMPLANTS     20 years ago  . thyroid goiter     Family History: History reviewed. No pertinent family history. Family  Psychiatric  History: see HPI Social History:  History  Alcohol Use No     History  Drug Use No    Social History   Social History  . Marital status: Married    Spouse name: N/A  . Number of children: N/A  . Years of education: N/A   Social History Main Topics  . Smoking status: Never Smoker  . Smokeless tobacco: Never Used  . Alcohol use No  . Drug use: No  . Sexual activity: Not Asked   Other Topics Concern  . None   Social History Narrative  . None    Hospital Course:  Lauren Longis a 56 y.o.married femalewho presented to  [facility] prior to psychiatric admission.  She is established with a therapist and with [facility].  Per chart review, patient has diagnosis of depression and OCD.  Patient has extensive history of GI symptoms such as poor appetite, nausea and vomiting.  Per chart review, extensive imaging studies to include CT scans and Ba swallow were negative and normal.     Per Dr Lauren Bolton who completed suicide risk assessment.  Patient denies any thoughts of suicide. She has never attempted suicide in the past. Says she has never contemplated suicide. Patient states that she wants to get better. She has good support at home. No substance use. No evidence of mania. No evidence of psychosis. No obsession or compulsion. No weapons in the house. No thoughts of violence.  I met with her husband. He collaborated her story. Husband does not have any concerns with regards to suicidality. Her husband wants her home today. They clearly understand treatment  plan designated below.   Lauren Bolton was admitted for MDD (major depressive disorder), recurrent episode, severe ([place]) and crisis management.  Patient was treated with medications with their indications listed below in detail under Medication List.  She has been started on Mirtazapine 7.5 mg. She has been tolerating this well. .  Medical problems were identified and treated  as needed.  Home medications were restarted as appropriate.  Improvement was monitored by observation and Lauren Bolton daily report of symptom reduction.  Emotional and mental status was monitored by daily self inventory reports completed by Lauren Bolton and clinical staff.  Patient reported continued improvement, denied any new concerns.  Patient had been compliant on medications and denied side effects.  Support and encouragement was provided.          Lauren Bolton was evaluated by the treatment team for stability and plans for continued recovery upon discharge.  Patient was offered further treatment options upon discharge including Residential, Intensive Outpatient and Outpatient treatment. Patient will follow up with agency listed below for medication management and counseling.  Encouraged patient to maintain satisfactory support network and home environment.  Advised to adhere to medication compliance and outpatient treatment follow up.  Prescriptions provided.       Lauren Bolton motivation was an integral factor for scheduling further treatment.  Employment, transportation, bed availability, health status, family support, and any pending legal issues were also considered during patient's hospital stay.  Upon completion of this admission the patient was both mentally and medically stable for discharge denying suicidal/homicidal ideation, auditory/visual/tactile hallucinations, delusional thoughts and paranoia.      Physical Findings: AIMS: Facial and Oral Movements Muscles of Facial Expression: None, normal Lips and Perioral Area: None, normal Jaw: None, normal Tongue: None, normal,Extremity Movements Upper (arms, wrists, hands, fingers): None, normal Lower (legs, knees, ankles, toes): None, normal, Trunk Movements Neck, shoulders, hips: None, normal, Overall Severity Severity of abnormal movements (highest score from questions above): None, normal Incapacitation due to abnormal movements:  None, normal Patient's awareness of abnormal movements (rate only patient's report): No Awareness, Dental Status Current problems with teeth and/or dentures?: No Does patient usually wear dentures?: No  CIWA:  CIWA-Ar Total: 1 COWS:  COWS Total Score: 2  Musculoskeletal: Strength & Muscle Tone: within normal limits Gait & Station: normal Patient leans: N/A  Psychiatric Specialty Exam:  See MD SRA Physical Exam  ROS  Blood pressure 135/85, pulse 93, temperature 97.8 F (36.6 C), temperature source Oral, resp. rate 18, height 5' (1.524 m), weight 41.3 kg (91 lb), SpO2 100 %.Body mass index is 17.77 kg/m.  Have you used any form of tobacco in the last 30 days? (Cigarettes, Smokeless Tobacco, Cigars, and/or Pipes): No  Has this patient used any form of tobacco in the last 30 days? (Cigarettes, Smokeless Tobacco, Cigars, and/or Pipes) Yes, N/A  Blood Alcohol level:  Lab Results  Component Value Date   ETH <5 02/05/2016   ETH <5 05/39/7673    Metabolic Disorder Labs:  No results found for: HGBA1C, MPG No results found for: PROLACTIN No results found for: CHOL, TRIG, HDL, CHOLHDL, VLDL, LDLCALC  See Psychiatric Specialty Exam and Suicide Risk Assessment completed by Attending Physician prior to discharge.  Discharge destination:  Home  Is patient on multiple antipsychotic therapies at discharge:  No   Has Patient had three or more failed trials of antipsychotic monotherapy by history:  No  Recommended Plan for Multiple Antipsychotic Therapies: NA   Allergies as of 02/06/2016  Reactions   Benadryl [diphenhydramine Hcl] Diarrhea, Other (See Comments)   Reaction:  Makes her excessively sleepy    Lactose Intolerance (gi)       Medication List    STOP taking these medications   calcium carbonate 500 MG chewable tablet Commonly known as:  TUMS - dosed in mg elemental calcium   docusate sodium 100 MG capsule Commonly known as:  COLACE   feeding supplement (PRO-STAT  SUGAR FREE 64) Liqd   LORazepam 0.5 MG tablet Commonly known as:  ATIVAN   omeprazole 40 MG capsule Commonly known as:  PRILOSEC   ondansetron 8 MG disintegrating tablet Commonly known as:  ZOFRAN ODT     TAKE these medications     Indication  iron polysaccharides 150 MG capsule Commonly known as:  NIFEREX Take 1 capsule (150 mg total) by mouth 2 (two) times daily.  Indication:  Anemia From Inadequate Iron in the Body   levothyroxine 100 MCG tablet Commonly known as:  SYNTHROID Take 1 tablet (100 mcg total) by mouth daily before breakfast.  Indication:  Underactive Thyroid   mirtazapine 15 MG tablet Commonly known as:  REMERON Take 1 tablet (15 mg total) by mouth at bedtime. What changed:  medication strength  how much to take  Indication:  Trouble Sleeping, Major Depressive Disorder   simethicone 80 MG chewable tablet Commonly known as:  MYLICON Chew 2 tablets (160 mg total) by mouth 4 (four) times daily as needed for flatulence.  Indication:  abd discomfort      Follow-up Information    Dr. Reynold Bowen MD Follow up on 02/19/2016.   Why:  You have follow-up/medication management appt scheduled for this day at 2:30PM Contact information: [place], [place] [address] Phone: [phone] Fax: [phone]          Follow-up recommendations:  Activity:  as tol Diet:  as tol  Comments:  1.  Take all your medications as prescribed.   2.  Report any adverse side effects to outpatient provider. 3.  Patient instructed to not use alcohol or illegal drugs while on prescription medicines. 4.  In the event of worsening symptoms, instructed patient to call 911, the crisis hotline or go to nearest emergency room for evaluation of symptoms.  Signed: Janett Labella, NP [facility] 02/07/2016, 9:08 AM

## 2016-02-06 NOTE — Progress Notes (Signed)
Discharge Note:  Patient discharged home with husband.  Patient denied SI and HI.  Denied A/V halluciantions.  Suicide prevention information given and discussed with patient who stated she understood and had no questions.  Patient stated she received all her belongings, clothing, toiletries, misc items, prescriptions.  All required discharge information given to patient at discharge.

## 2016-02-06 NOTE — [facility] Suicide Risk Assessment (Signed)
[facility] Admission Suicide Risk Assessment   Nursing information obtained from:  Patient, Review of record Demographic factors:  Unemployed Current Mental Status:  NA (Denies SI) Loss Factors:  Decrease in vocational status, Decline in physical health Historical Factors:  NA (Pt denies) Risk Reduction Factors:  Living with another person, especially a relative, Positive social support  Total Time spent with patient: 30 minutes Principal Problem: <principal problem not specified> Diagnosis:   Patient Active Problem List   Diagnosis Date Noted  . MDD (major depressive disorder), recurrent episode, severe ([place]) [F33.2] 02/06/2016  . Adjustment disorder with mixed anxiety and depressed mood [F43.23] 12/26/2015  . Malnutrition ([place]) [E46]   . Intractable vomiting [R11.10] 12/23/2015  . Hypokalemia [E87.6] 11/11/2015  . Iron deficiency anemia [D50.9] 11/11/2015  . Malaise and fatigue [R53.81, R53.83] 11/11/2015  . Failure to thrive in adult [R62.7] 11/11/2015  . Protein-calorie malnutrition, severe [E43] 11/11/2015  . Nausea with vomiting [R11.2] 11/11/2015  . Anxiety state [F41.1]   . Decreased oral intake [R63.8]   . Hypothyroidism [E03.9] 11/10/2015   Subjective Data:  56 yo Asian female, stable family background, no past history of mental illness. She has been feeling depressed since June. She has had a lot of somatic symptoms. Repeated investigation was negative. Patient has been less functional at home. 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 She has not been able to do most of the chores. She tends to stay in bed most of the time. Patient has been feeling guilty about this. She tries to clean but not able to persistently complete tasks. She has been started on Mirtazapine 7.5 mg. She has been tolerating this well. Patient denies any thoughts of suicide. She has never attempted suicide in the past. Says she has never contemplated suicide. Patient states that she wants to get  better. She has good support at home. No substance use. No evidence of mania. No evidence of psychosis. No obsession or compulsion. No weapons in the house. No thoughts of violence.  I met with her husband. He collaborated her story. Husband does not have any concerns with regards to suicidality. Her husband wants her home today. They clearly understand treatment plan designated below.   Continued Clinical Symptoms:  Alcohol Use Disorder Identification Test Final Score (AUDIT): 0 The "Alcohol Use Disorders Identification Test", Guidelines for Use in Primary Care, Second Edition.  World Pharmacologist [facility]). Score between 0-7:  no or low risk or alcohol related problems. Score between 8-15:  moderate risk of alcohol related problems. Score between 16-19:  high risk of alcohol related problems. Score 20 or above:  warrants further diagnostic evaluation for alcohol dependence and treatment.   CLINICAL FACTORS:   as above   Musculoskeletal: Strength & Muscle Tone: within normal limits Gait & Station: normal Patient leans: N/A  Psychiatric Specialty Exam: Physical Exam  Constitutional: She is oriented to person, place, and time. She appears well-developed and well-nourished.  HENT:  Head: Normocephalic and atraumatic.  Right Ear: External ear normal.  Eyes: Conjunctivae and EOM are normal. Pupils are equal, round, and reactive to light.  Neck: Normal range of motion. Neck supple.  Cardiovascular: Normal rate, regular rhythm and normal heart sounds.   Respiratory: Effort normal and breath sounds normal.  GI: Soft. Bowel sounds are normal.  Musculoskeletal: Normal range of motion.  Neurological: She is alert and oriented to person, place, and time. She has normal reflexes.  Skin: Skin is warm and dry.  Psychiatric:  As above    ROS  Blood pressure 138/85, pulse 93, temperature 97.8 F (36.6 C), temperature source Oral, resp. rate 18, height 5' (1.524 m), weight 41.3 kg (91 lb),  SpO2 100 %.Body mass index is 17.77 kg/m.  General Appearance: Slim build, slightly anxious, psychomotor retardation. Polite and relates well. Not internally stimulated. Approrpiate behavior.  Eye Contact:  Good  Speech:  Soft spoken, sponatneous  Volume:  decreased  Mood:  Anxious and Depressed  Affect:  Congruent and Restricted  Thought Process:  Coherent and Goal Directed  Orientation:  Full (Time, Place, and Person)  Thought Content:  No obsession, no guilty rumination. no thoughts of violence. No delusional theme. No hallucination  Suicidal Thoughts:  No  Homicidal Thoughts:  No  Memory:  Immediate;   Good  Judgement:  Good  Insight:  Good  Psychomotor Activity:  Decreased  Concentration:  Attention Span: Good  Recall:  Good  Fund of Knowledge:  Good  Language:  Good  Akathisia:  No  Handed:    AIMS (if indicated):     Assets:  Financial Resources/Insurance Housing Intimacy Resilience Social Support  ADL's:  limited  Cognition:  WNL  Sleep:  Number of Hours: 2      COGNITIVE FEATURES THAT CONTRIBUTE TO RISK:  None    SUICIDE RISK:   Minimal: No identifiable suicidal ideation.  Patients presenting with no risk factors but with morbid ruminations; may be classified as minimal risk based on the severity of the depressive symptoms  PLAN OF CARE:  Patient has a supportive husband and two grown children. She has been started on Mirtazapine. We agreed to increase her dose to 15 mg HS She would follow up with her psychiatrist/PCP. Goal is to increase towards antidepressant/antianxiety dose and wean of Ativan in a couple of weeks.   I certify that inpatient services furnished can reasonably be expected to improve the patient's condition.   Artist [place], MD 02/06/2016, 2:43 PM

## 2016-02-06 NOTE — Progress Notes (Signed)
NUTRITION ASSESSMENT  RD consulted for nutrition assessment and poor PO intake.  INTERVENTION: 1. Encourage PO intakes 2. Supplements:  -Boost Breeze po BID, each supplement provides 250 kcal and 9 grams of protein -Ensure Enlive po BID, each supplement provides 350 kcal and 20 grams of protein 3. Multivitamin with minerals daily  NUTRITION DIAGNOSIS: Unintentional weight loss related to sub-optimal intake as evidenced by pt report.   Goal: Pt to meet >/= 90% of their estimated nutrition needs.  Monitor:  PO intake  Assessment:  Pt admitted with depression and anxiety. Pt is non-english speaking.  Pt reports poor appetite and weight loss of 8-9 lbs. Pt's diet has mostly consisted of rice, with chicken or broths sometimes. Pt developed nausea PTA which prevented her from taking in food. Pt is now taking Remeron.  Per nursing note, symptoms began in August 2017.  Patient would benefit from nutritional supplementation, will order Ensure and Boost Breeze supplements for patient to try. Will also order multivitamin with minerals daily.   Pt's weight is currently increased since 11/26. Weight tends to fluctuate per chart review.   Height: Ht Readings from Last 1 Encounters:  02/06/16 5' (1.524 m)    Weight: Wt Readings from Last 1 Encounters:  02/06/16 91 lb (41.3 kg)    Weight Hx: Wt Readings from Last 10 Encounters:  02/06/16 91 lb (41.3 kg)  12/23/15 80 lb (36.3 kg)  12/13/15 91 lb 3 oz (41.4 kg)  12/01/15 88 lb (39.9 kg)  11/23/15 97 lb (44 kg)  11/14/15 91 lb 14.4 oz (41.7 kg)  06/20/15 89 lb (40.4 kg)    BMI:  Body mass index is 17.77 kg/m. Pt meets criteria for underweight based on current BMI.  Estimated Nutritional Needs: Kcal: 25-30 kcal/kg Protein: > 1 gram protein/kg Fluid: 1 ml/kcal  Diet Order: Diet regular Room service appropriate? Yes; Fluid consistency: Thin Pt is also offered choice of unit snacks mid-morning and mid-afternoon.  Pt is eating as  desired.   Lab results and medications reviewed.   Lauren FrancoLindsey Abigayle Wilinski, MS, RD, LDN Pager: [phone] After Hours Pager: [phone]

## 2016-02-06 NOTE — ED Notes (Signed)
[facility], [facility], receiving RN, or herself, will call back for report.

## 2016-02-06 NOTE — B[facility] Counselor (Signed)
Adult Comprehensive Assessment  Patient ID: Lauren Bolton, female   DOB: 1960-10-05, 56 y.o.   MRN: [id number]  Information Source: Information source: Patient  Current Stressors:  Educational / Learning stressors: some schooling in [place]. Pt reports she is unable to read or write [place]  Employment / Job issues: has not been working in a few months Family Relationships: close with husband and 2 adult sons Surveyor, quantityinancial / Lack of resources (include bankruptcy): income from [place]. pt reports some financial strain that has been stressful Housing / Lack of housing: lives with oldest son and husband Physical health (include injuries & life threatening diseases): pt reports low iron and recent goiter removal; intestinal problems Social relationships: fair--some family supports. many family members in [place] Substance abuse: none-pt prescribed ativan by PCP for anxiety; no drug or alcohol use reported Bereavement / Loss: none identified.   Living/Environment/Situation:  Living Arrangements: Spouse/significant other, Children Living conditions (as described by patient or guardian): lives with adult son and pt's husband How [place] has patient lived in current situation?: 35 years What is atmosphere in current home: Comfortable, ParamedicLoving, Supportive  Family History:  Marital status: Married Number of Years Married: 35 What types of issues is patient dealing with in the relationship?: Its good. Because of illnes, needed to close the restraunt. Because she cannot work, and I am taking care of her. It causes lots of worry. Additional relationship information: n/a  Are you sexually active?: Yes What is your sexual orientation?: Heterosexual Has your sexual activity been affected by drugs, alcohol, medication, or emotional stress?: n/a  Does patient have children?: Yes How many children?: 2 How is patient's relationship with their children?: Lauren Bolton 33 - Lauren Bolton - 32 - I am not feeling  like talking to anyone right now  Childhood History:  By whom was/is the patient raised?: Mother Additional childhood history information: Father passed away when I was 56 years old. It was a good childhood. [place] to [place] in [place] and am a [place] citiz[place]  Description of patient's relationship with caregiver when they were a child: Mother - was okay, We were all close. Close with Father before he passed Patient's description of current relationship with people who raised him/her: Mother passed 2005 , Father died when I was 10 How were you disciplined when you got in trouble as a child/adolescent?: I didn't get in trouble,  Does patient have siblings?: Yes Number of Siblings: 8 Description of patient's current relationship with siblings: Some [place] some [place]  Did patient suffer any verbal/emotional/physical/sexual abuse as a child?: No Did patient suffer from severe childhood neglect?: No Has patient ever been sexually abused/assaulted/raped as an adolescent or adult?: No Was the patient ever a victim of a crime or a disaster?: No Witnessed domestic violence?: No Has patient been effected by domestic violence as an adult?: No  Education:  Highest grade of school patient has completed: NA Currently a student?: No Name of school: NA Learning disability?: No  Employment/Work Situation:   Employment situation: Unemployed Patient's job has been impacted by current illness: Yes Describe how patient's job has been impacted: pt reports some anxiety symtoms that have increased over time.  What is the longest time patient has a held a job?: few years Where was the patient employed at that time?: na Has patient ever been in the [employer] and Companymilitary?: No Has patient ever served in combat?: No Did You Receive Any Psychiatric Treatment/Services While in Equities traderthe Military?: No Are There Guns or Other  Weapons in Your Home?: No Are These Weapons Safely Secured?:  (n/a)  Financial Resources:    Financial resources: Support from parents / caregiver, Income from spouse, Private insurance Does patient have a representative payee or guardian?: No  Alcohol/Substance Abuse:   What has been your use of drugs/alcohol within the last 12 months?: pt denies If attempted suicide, did drugs/alcohol play a role in this?: No Alcohol/Substance Abuse Treatment Hx: Denies past history If yes, describe treatment: n/a  Has alcohol/substance abuse ever caused legal problems?: No  Social Support System:   Patient's Community Support System: Fair Museum/gallery exhibitions officer System: some friends; close family. some in [place] Type of faith/religion: Buddhist How does patient's faith help to cope with current illness?: her faith gives her comfort.   Leisure/Recreation:   Leisure and Hobbies: "Scientist, physiological."  Strengths/Needs:   What things does the patient do well?: mother; wife In what areas does patient struggle / problems for patient: coping with anxiety  Discharge Plan:   Does patient have access to transportation?: Yes (husband) Will patient be returning to same living situation after discharge?: Yes Currently receiving community mental health services: Yes (From Whom) If no, would patient like referral for services when discharged?: Yes (What county?) ([place]--Dr. Saint Martin MD) Does patient have financial barriers related to discharge medications?: Yes Patient description of barriers related to discharge medications: n/a   Summary/Recommendations:   Summary and Recommendations (to be completed by the evaluator): Patient is 56yo female living in [place], [place] with her husband and son. patient presents to the hospital seeking treatment for anxiety and depression. Patient denies SI/HI/AVH and reports that she would like to resume services with her current provider and go home today. CSW assessing. Recommenations for patient include: Crisis stabilization, therapeutic milieu, encourage group attendance  and participation, medication management for mood stabilization; and development of comprehensive mental wellness plan.    Ledell Peoples Smart LCSW 02/06/2016 3:02 PM

## 2016-02-10 DIAGNOSIS — F329 Major depressive disorder, single episode, unspecified: Secondary | ICD-10-CM | POA: Diagnosis not present

## 2016-02-19 DIAGNOSIS — D649 Anemia, unspecified: Secondary | ICD-10-CM | POA: Diagnosis not present

## 2016-02-19 DIAGNOSIS — F329 Major depressive disorder, single episode, unspecified: Secondary | ICD-10-CM | POA: Diagnosis not present

## 2016-02-19 DIAGNOSIS — E890 Postprocedural hypothyroidism: Secondary | ICD-10-CM | POA: Diagnosis not present

## 2016-02-19 DIAGNOSIS — R7301 Impaired fasting glucose: Secondary | ICD-10-CM | POA: Diagnosis not present

## 2016-02-19 DIAGNOSIS — R112 Nausea with vomiting, unspecified: Secondary | ICD-10-CM | POA: Diagnosis not present

## 2016-02-19 DIAGNOSIS — E43 Unspecified severe protein-calorie malnutrition: Secondary | ICD-10-CM | POA: Diagnosis not present

## 2016-03-16 ENCOUNTER — Ambulatory Visit (HOSPITAL_COMMUNITY): Payer: Self-pay | Admitting: Psychiatry

## 2016-04-14 ENCOUNTER — Encounter (HOSPITAL_COMMUNITY): Payer: Self-pay | Admitting: Psychiatry

## 2016-04-14 ENCOUNTER — Ambulatory Visit (INDEPENDENT_AMBULATORY_CARE_PROVIDER_SITE_OTHER): Payer: BLUE CROSS/BLUE SHIELD | Admitting: Psychiatry

## 2016-04-14 VITALS — BP 110/68 | HR 75 | Ht 60.0 in | Wt 100.0 lb

## 2016-04-14 DIAGNOSIS — F3341 Major depressive disorder, recurrent, in partial remission: Secondary | ICD-10-CM

## 2016-04-14 DIAGNOSIS — Z79899 Other long term (current) drug therapy: Secondary | ICD-10-CM | POA: Diagnosis not present

## 2016-04-14 MED ORDER — MIRTAZAPINE 15 MG PO TABS: 15.0000 mg | ORAL_TABLET | Freq: Every day | ORAL | 0 refills | Status: DC

## 2016-04-14 MED ORDER — LORAZEPAM 0.5 MG PO TABS: 0.5000 mg | ORAL_TABLET | Freq: Three times a day (TID) | ORAL | 0 refills | Status: DC
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
## 2016-04-24 DIAGNOSIS — E890 Postprocedural hypothyroidism: Secondary | ICD-10-CM | POA: Diagnosis not present

## 2016-04-24 DIAGNOSIS — R42 Dizziness and giddiness: Secondary | ICD-10-CM | POA: Diagnosis not present

## 2016-04-24 DIAGNOSIS — R509 Fever, unspecified: Secondary | ICD-10-CM | POA: Diagnosis not present

## 2016-04-24 DIAGNOSIS — N951 Menopausal and female climacteric states: Secondary | ICD-10-CM | POA: Diagnosis not present

## 2016-05-05 DIAGNOSIS — E890 Postprocedural hypothyroidism: Secondary | ICD-10-CM | POA: Diagnosis not present

## 2016-05-05 DIAGNOSIS — R0600 Dyspnea, unspecified: Secondary | ICD-10-CM | POA: Diagnosis not present

## 2016-05-05 DIAGNOSIS — Z1389 Encounter for screening for other disorder: Secondary | ICD-10-CM | POA: Diagnosis not present

## 2016-05-05 DIAGNOSIS — K219 Gastro-esophageal reflux disease without esophagitis: Secondary | ICD-10-CM | POA: Diagnosis not present

## 2016-05-05 DIAGNOSIS — F4323 Adjustment disorder with mixed anxiety and depressed mood: Secondary | ICD-10-CM | POA: Diagnosis not present

## 2016-05-08 DIAGNOSIS — R112 Nausea with vomiting, unspecified: Secondary | ICD-10-CM | POA: Diagnosis not present

## 2016-05-19 DIAGNOSIS — K5900 Constipation, unspecified: Secondary | ICD-10-CM | POA: Diagnosis not present

## 2016-05-19 DIAGNOSIS — R140 Abdominal distension (gaseous): Secondary | ICD-10-CM | POA: Diagnosis not present

## 2016-05-19 DIAGNOSIS — R1030 Lower abdominal pain, unspecified: Secondary | ICD-10-CM | POA: Diagnosis not present

## 2016-05-19 DIAGNOSIS — R112 Nausea with vomiting, unspecified: Secondary | ICD-10-CM | POA: Diagnosis not present

## 2016-05-29 DIAGNOSIS — E890 Postprocedural hypothyroidism: Secondary | ICD-10-CM | POA: Diagnosis not present

## 2016-05-29 DIAGNOSIS — E784 Other hyperlipidemia: Secondary | ICD-10-CM | POA: Diagnosis not present

## 2016-06-05 DIAGNOSIS — R634 Abnormal weight loss: Secondary | ICD-10-CM | POA: Diagnosis not present

## 2016-06-05 DIAGNOSIS — Z1389 Encounter for screening for other disorder: Secondary | ICD-10-CM | POA: Diagnosis not present

## 2016-06-05 DIAGNOSIS — E890 Postprocedural hypothyroidism: Secondary | ICD-10-CM | POA: Diagnosis not present

## 2016-06-05 DIAGNOSIS — F329 Major depressive disorder, single episode, unspecified: Secondary | ICD-10-CM | POA: Diagnosis not present

## 2016-06-18 DIAGNOSIS — R740 Nonspecific elevation of levels of transaminase and lactic acid dehydrogenase [LDH]: Secondary | ICD-10-CM | POA: Diagnosis not present

## 2016-07-02 ENCOUNTER — Encounter (HOSPITAL_COMMUNITY): Payer: Self-pay | Admitting: Psychiatry

## 2016-07-02 ENCOUNTER — Other Ambulatory Visit (HOSPITAL_COMMUNITY): Payer: Self-pay

## 2016-07-02 ENCOUNTER — Ambulatory Visit (INDEPENDENT_AMBULATORY_CARE_PROVIDER_SITE_OTHER): Payer: BLUE CROSS/BLUE SHIELD | Admitting: Psychiatry

## 2016-07-02 DIAGNOSIS — Z888 Allergy status to other drugs, medicaments and biological substances status: Secondary | ICD-10-CM

## 2016-07-02 DIAGNOSIS — E079 Disorder of thyroid, unspecified: Secondary | ICD-10-CM | POA: Diagnosis not present

## 2016-07-02 DIAGNOSIS — E739 Lactose intolerance, unspecified: Secondary | ICD-10-CM

## 2016-07-02 DIAGNOSIS — F419 Anxiety disorder, unspecified: Secondary | ICD-10-CM | POA: Diagnosis not present

## 2016-07-02 DIAGNOSIS — Z79899 Other long term (current) drug therapy: Secondary | ICD-10-CM

## 2016-07-02 DIAGNOSIS — F3341 Major depressive disorder, recurrent, in partial remission: Secondary | ICD-10-CM

## 2016-07-02 DIAGNOSIS — G4700 Insomnia, unspecified: Secondary | ICD-10-CM

## 2016-07-02 DIAGNOSIS — D509 Iron deficiency anemia, unspecified: Secondary | ICD-10-CM

## 2016-07-02 MED ORDER — MIRTAZAPINE 15 MG PO TABS: 22.5000 mg | ORAL_TABLET | Freq: Every day | ORAL | 1 refills | Status: DC

## 2016-07-02 MED ORDER — LORAZEPAM 0.5 MG PO TABS: 0.5000 mg | ORAL_TABLET | Freq: Three times a day (TID) | ORAL | 0 refills | Status: DC

## 2016-07-02 NOTE — Progress Notes (Signed)
BH MD/PA/NP OP Progress Note  07/02/2016 3:57 PM Lauren Bolton  MRN:  [id number]  Chief Complaint: med check  Subjective:  Lauren Bolton since today for education management check. She has had some circadian rhythm delay, falling asleep at 3 AM and then not waking up until 10 AM. She does wake up around 8 AM to take her Synthroid but then goes back to bed. This is been gradually happening over the past couple weeks. Her appetite has also continued to be on the low end of things.  Her and her husband are agreeable to increasing Remeron to 22.5 mg, and if needed, 30 mg.  She continues to use Ativan about twice a day as needed, but does not use it every day.  No suicidality or safety issues at home.  She continues to be worried about being on medicines for depression, and I spent time empathizing with her and validating that its difficult to get used to taking these medicines.  The Remeron does seem to have helped with her anxiety, mood, sleep, eating.  Visit Diagnosis:    ICD-10-CM   1. Recurrent major depressive disorder, in partial remission (HCC) F33.41 mirtazapine (REMERON) 15 MG tablet    LORazepam (ATIVAN) 0.5 MG tablet    Past Psychiatric History: See intake H&P for full details. Reviewed, with no updates at this time.  Past Medical History:  Past Medical History:  Diagnosis Date  . Anxiety   . Depression   . Iron deficiency anemia   . Thyroid disease     Past Surgical History:  Procedure Laterality Date  . ESOPHAGOGASTRODUODENOSCOPY N/A 11/13/2015   Procedure: ESOPHAGOGASTRODUODENOSCOPY (EGD);  Surgeon: Dorena Cookey, MD;  Location: [facility];  Service: Endoscopy;  Laterality: N/A;  . ESOPHAGOGASTRODUODENOSCOPY (EGD) WITH PROPOFOL N/A 12/26/2015   Procedure: ESOPHAGOGASTRODUODENOSCOPY (EGD) WITH PROPOFOL;  Surgeon: Bernette Redbird, MD;  Location[facility] ENDOSCOPY;  Service: Endoscopy;  Laterality: N/A;  . PLACEMENT OF BREAST IMPLANTS     20 years ago  . thyroid goiter      Family  Psychiatric History: See intake H&P for full details. Reviewed, with no updates at this time.   Family History: History reviewed. No pertinent family history.  Social History:  Social History   Social History  . Marital status: Married    Spouse name: N/A  . Number of children: N/A  . Years of education: N/A   Social History Main Topics  . Smoking status: Never Smoker  . Smokeless tobacco: Never Used  . Alcohol use No  . Drug use: No  . Sexual activity: Not Currently   Other Topics Concern  . None   Social History Narrative  . None    Allergies:  Allergies  Allergen Reactions  . Benadryl [Diphenhydramine Hcl] Diarrhea and Other (See Comments)    Reaction:  Makes her excessively sleepy   . Lactose Intolerance (Gi)     Metabolic Disorder Labs: No results found for: HGBA1C, MPG No results found for: PROLACTIN No results found for: CHOL, TRIG, HDL, CHOLHDL, VLDL, LDLCALC   Current Medications: Current Outpatient Prescriptions  Medication Sig Dispense Refill  . docusate calcium (SURFAK) 240 MG capsule Take 240 mg by mouth daily.    . iron polysaccharides (NIFEREX) 150 MG capsule Take 1 capsule (150 mg total) by mouth 2 (two) times daily. 60 capsule 0  . levothyroxine (SYNTHROID) 100 MCG tablet Take 1 tablet (100 mcg total) by mouth daily before breakfast. 30 tablet 0  . LORazepam (ATIVAN) 0.5 MG tablet  Take 1 tablet (0.5 mg total) by mouth 3 (three) times daily. 30 tablet 0  . mirtazapine (REMERON) 15 MG tablet Take 1.5 tablets (22.5 mg total) by mouth at bedtime. 45 tablet 1  . simethicone (MYLICON) 80 MG chewable tablet Chew 2 tablets (160 mg total) by mouth 4 (four) times daily as needed for flatulence. 30 tablet 0   No current facility-administered medications for this visit.     Neurologic: Headache: Negative Seizure: Negative Paresthesias: Negative  Musculoskeletal: Strength & Muscle Tone: within normal limits Gait & Station: normal Patient leans:  N/A  Psychiatric Specialty Exam: ROS  Blood pressure 120/68, pulse 76, height 5' (1.524 m), weight 101 lb (45.8 kg).Body mass index is 19.73 kg/m.  General Appearance: Casual and Fairly Groomed  Eye Contact:  Fair  Speech:  Clear and Coherent  Volume:  Normal  Mood:  Anxious  Affect:  Congruent  Thought Process:  Goal Directed  Orientation:  Full (Time, Place, and Person)  Thought Content: Logical   Suicidal Thoughts:  No  Homicidal Thoughts:  No  Memory:  Recent;   Good  Judgement:  Good  Insight:  Fair  Psychomotor Activity:  Normal  Concentration:  Concentration: Fair  Recall:  [employer]  Fund of Knowledge: Fair  Language: Fair  Akathisia:  Negative  Handed:  Right  AIMS (if indicated):  0  Assets:  Communication Skills Desire for Improvement Financial Resources/Insurance Housing Intimacy  ADL's:  Intact  Cognition: WNL  Sleep:  7 hours, 3am-10am    Treatment Plan Summary: Lauren Bolton is a 56 year old [place] female with a history of anxiety, insomnia, complicated by medical issues of thyroid disease and iron deficiency anemia. She presents today for medication management follow-up. She has had benefit from Remeron, and would benefit further from an increase in the medication. She presents with some circadian rhythm delay, so we agreed to move her dose of Remeron from 11 PM to 8 PM to help with this shift in her sleep cycle. No acute safety issues and she will follow-up in 1 to 2 months.  1. Recurrent major depressive disorder, in partial remission (HCC)    Continue Remeron at the increased dose of 22.5 mg nightly Ativan 0.5 mg twice daily as needed Follow-up in 1-2 months Ongoing psychoeducation and reassurance   Burnard LeighAlexander Arya Eksir, MD 07/02/2016, 3:57 PM

## 2016-07-16 ENCOUNTER — Ambulatory Visit (HOSPITAL_COMMUNITY): Payer: Self-pay | Admitting: Psychiatry

## 2016-07-30 DIAGNOSIS — R740 Nonspecific elevation of levels of transaminase and lactic acid dehydrogenase [LDH]: Secondary | ICD-10-CM | POA: Diagnosis not present

## 2016-07-30 DIAGNOSIS — D649 Anemia, unspecified: Secondary | ICD-10-CM | POA: Diagnosis not present

## 2016-07-30 DIAGNOSIS — E784 Other hyperlipidemia: Secondary | ICD-10-CM | POA: Diagnosis not present

## 2016-07-30 DIAGNOSIS — Z1389 Encounter for screening for other disorder: Secondary | ICD-10-CM | POA: Diagnosis not present

## 2016-07-30 DIAGNOSIS — E890 Postprocedural hypothyroidism: Secondary | ICD-10-CM | POA: Diagnosis not present

## 2016-07-30 DIAGNOSIS — F4323 Adjustment disorder with mixed anxiety and depressed mood: Secondary | ICD-10-CM | POA: Diagnosis not present

## 2016-08-03 DIAGNOSIS — F4323 Adjustment disorder with mixed anxiety and depressed mood: Secondary | ICD-10-CM | POA: Diagnosis not present

## 2016-08-18 ENCOUNTER — Encounter (HOSPITAL_COMMUNITY): Payer: Self-pay | Admitting: Psychiatry

## 2016-08-18 ENCOUNTER — Ambulatory Visit (INDEPENDENT_AMBULATORY_CARE_PROVIDER_SITE_OTHER): Payer: BLUE CROSS/BLUE SHIELD | Admitting: Psychiatry

## 2016-08-18 DIAGNOSIS — F3341 Major depressive disorder, recurrent, in partial remission: Secondary | ICD-10-CM | POA: Diagnosis not present

## 2016-08-18 MED ORDER — MIRTAZAPINE 30 MG PO TABS: 30.0000 mg | ORAL_TABLET | Freq: Every day | ORAL | 0 refills | Status: DC

## 2016-08-18 MED ORDER — VENLAFAXINE HCL ER 37.5 MG PO CP24: 37.5000 mg | ORAL_CAPSULE | Freq: Every day | ORAL | 1 refills | Status: DC

## 2016-08-18 NOTE — Progress Notes (Signed)
BH MD/PA/NP OP Progress Note  08/18/2016 4:16 PM Lauren Bolton  MRN:  [id number]  Chief Complaint:  Chief Complaint    Follow-up     Subjective:  Lauren Bolton presents today for med management follow-up. She continues with episodes of tearfulness throughout the day, depressed mood, low motivation, low energy, fixation on somatic symptoms. She reports that she has went to her primary care doctor multiple times and workup is been unrevealing. Her thyroid levels remain normal. She continues to sleep well at night with Remeron, but worries that this is not helping with her mood or depression. Spent time discussing her ongoing struggle with depression, and my recommendation that we start antidepressant such as Effexor in conjunction with Remeron. She is previously been tried on Zoloft and had a negative response to this, but the response was notably in the setting of also having her thyroid increased, so I suspect her thyroid exacerbated her anxiety rather than Zoloft. Spent time discussing the risks and benefits. She is previously been tried on Prozac, Zoloft, Remeron. Discussed that we can use Effexor in conjunction with Remeron. Spent time answering her questions about depression, about her physical symptoms associated with her mood, providing psychoeducation to family. This was a prolonged visit given the necessity for translator, and patient has fairly healthcare literacy. I spent time educating the patient about the mechanism of antidepressants, about the pathophysiology of depression, and about nonmedication options including individual therapy. She is not interested in therapy, and has significant apprehensions about talking with other people about her feelings. I spent time educating her about TMS, and discussing the risks and benefits of that treatment. We agreed for a trial of Effexor plus Remeron, and to consider TMS if the side effects of medication are intolerable for her.  Visit Diagnosis:   ICD-10-CM   1. Recurrent major depressive disorder, in partial remission (HCC) F33.41 mirtazapine (REMERON) 30 MG tablet    venlafaxine XR (EFFEXOR XR) 37.5 MG 24 hr capsule    Past Psychiatric History: See intake H&P for full details. Reviewed, with no updates at this time.   Past Medical History:  Past Medical History:  Diagnosis Date  . Anxiety   . Depression   . Iron deficiency anemia   . Thyroid disease     Past Surgical History:  Procedure Laterality Date  . ESOPHAGOGASTRODUODENOSCOPY N/A 11/13/2015   Procedure: ESOPHAGOGASTRODUODENOSCOPY (EGD);  Surgeon: Dorena CookeyJohn Hayes, MD;  [facility];  Service: Endoscopy;  Laterality: N/A;  . ESOPHAGOGASTRODUODENOSCOPY (EGD) WITH PROPOFOL N/A 12/26/2015   Procedure: ESOPHAGOGASTRODUODENOSCOPY (EGD) WITH PROPOFOL;  Surgeon: Bernette Redbirdobert Buccini, MD;  [facility];  Service: Endoscopy;  Laterality: N/A;  . PLACEMENT OF BREAST IMPLANTS     20 years ago  . thyroid goiter      Family Psychiatric History: See intake H&P for full details. Reviewed, with no updates at this time.   Family History: History reviewed. No pertinent family history.  Social History:  Social History   Social History  . Marital status: Married    Spouse name: N/A  . Number of children: N/A  . Years of education: N/A   Social History Main Topics  . Smoking status: Never Smoker  . Smokeless tobacco: Never Used  . Alcohol use No  . Drug use: No  . Sexual activity: Not Currently   Other Topics Concern  . None   Social History Narrative  . None    Allergies:  Allergies  Allergen Reactions  . Benadryl [Diphenhydramine Hcl] Diarrhea  and Other (See Comments)    Reaction:  Makes her excessively sleepy   . Lactose Intolerance (Gi)     Metabolic Disorder Labs: No results found for: HGBA1C, MPG No results found for: PROLACTIN No results found for: CHOL, TRIG, HDL, CHOLHDL, VLDL, LDLCALC   Current Medications: Current Outpatient  Prescriptions  Medication Sig Dispense Refill  . docusate sodium (COLACE) 100 MG capsule Take 100 mg by mouth daily.    . iron polysaccharides (NIFEREX) 150 MG capsule Take 1 capsule (150 mg total) by mouth 2 (two) times daily. 60 capsule 0  . levothyroxine (SYNTHROID) 100 MCG tablet Take 1 tablet (100 mcg total) by mouth daily before breakfast. 30 tablet 0  . LORazepam (ATIVAN) 0.5 MG tablet Take 1 tablet (0.5 mg total) by mouth 3 (three) times daily. 30 tablet 0  . mirtazapine (REMERON) 30 MG tablet Take 1 tablet (30 mg total) by mouth at bedtime. 90 tablet 0  . docusate calcium (SURFAK) 240 MG capsule Take 240 mg by mouth daily.    . simethicone (MYLICON) 80 MG chewable tablet Chew 2 tablets (160 mg total) by mouth 4 (four) times daily as needed for flatulence. (Patient not taking: Reported on 08/18/2016) 30 tablet 0  . venlafaxine XR (EFFEXOR XR) 37.5 MG 24 hr capsule Take 1 capsule (37.5 mg total) by mouth daily. 90 capsule 1   No current facility-administered medications for this visit.     Neurologic: Headache: Negative Seizure: Negative Paresthesias: Negative  Musculoskeletal: Strength & Muscle Tone: within normal limits Gait & Station: normal Patient leans: N/A  Psychiatric Specialty Exam: ROS  Blood pressure 124/80, pulse 82, height 4' 11.5" (1.511 m), weight 104 lb (47.2 kg), SpO2 95 %.Body mass index is 20.65 kg/m.  General Appearance: Casual and Fairly Groomed  Eye Contact:  Fair  Speech:  Normal Rate  Volume:  Decreased  Mood:  Depressed and Dysphoric  Affect:  Tearful  Thought Process:  Goal Directed  Orientation:  Full (Time, Place, and Person)  Thought Content: Logical   Suicidal Thoughts:  No  Homicidal Thoughts:  No  Memory:  Immediate;   Fair  Judgement:  Fair  Insight:  Shallow  Psychomotor Activity:  Normal  Concentration:  Concentration: Fair  Recall:  [employer] of Knowledge: Fair  Language: Good  Akathisia:  Negative  Handed:  Right  AIMS (if  indicated):  0  Assets:  Communication Skills Desire for Improvement Social Support Transportation  ADL's:  Intact  Cognition: WNL  Sleep:  5-6 hours     Treatment Plan Summary: Lauren Bolton is a 56 year old female with a history of severe major depressive disorder, with preoccupation with somatic symptoms, and this is been complicated by her thyroid illness, poor nutritional intake. She has generally poor health care literacy, and there is a language barrier contributing to difficulty in communicating her emotional and internal experiences. She has been tried on Prozac, Zoloft, Remeron without adequate response in terms of her depression symptoms. A large part of her inadequate response has been due to inadequate time of trials due to side effects from the medications. She continues on Remeron, and has agreed to initiate Effexor as below. If she does not tolerate Effexor and Remeron, then I have recommended that we proceed with TMS. I reviewed the risks and benefits of TMS and provided her with literature to review with her husband.    1. Recurrent major depressive disorder, in partial remission (HCC)    Follow-up visits should be  1 hour given the need for interpreter and significant language barrier Initiate Effexor 37.5 mg XR daily Increase Remeron to 30 mg nightly Follow-up in 1 month Consider TMS if she has side effects with these medications  Burnard Leigh, MD 08/18/2016, 4:16 PM

## 2016-08-18 NOTE — Patient Instructions (Signed)
START Effexor in the morning  TAKE Remeron at night

## 2016-08-20 DIAGNOSIS — R740 Nonspecific elevation of levels of transaminase and lactic acid dehydrogenase [LDH]: Secondary | ICD-10-CM | POA: Diagnosis not present

## 2016-08-20 DIAGNOSIS — R110 Nausea: Secondary | ICD-10-CM | POA: Diagnosis not present

## 2016-08-20 DIAGNOSIS — F418 Other specified anxiety disorders: Secondary | ICD-10-CM | POA: Diagnosis not present

## 2016-08-20 DIAGNOSIS — R109 Unspecified abdominal pain: Secondary | ICD-10-CM | POA: Diagnosis not present

## 2016-08-20 DIAGNOSIS — R209 Unspecified disturbances of skin sensation: Secondary | ICD-10-CM | POA: Diagnosis not present

## 2016-08-20 DIAGNOSIS — E890 Postprocedural hypothyroidism: Secondary | ICD-10-CM | POA: Diagnosis not present

## 2016-08-28 DIAGNOSIS — R7989 Other specified abnormal findings of blood chemistry: Secondary | ICD-10-CM | POA: Diagnosis not present

## 2016-09-05 ENCOUNTER — Emergency Department (HOSPITAL_COMMUNITY)
Admission: EM | Admit: 2016-09-05 | Discharge: 2016-09-05 | Disposition: A | Discharge status: Home / Self Care | Payer: BLUE CROSS/BLUE SHIELD | Attending: Emergency Medicine | Admitting: Emergency Medicine

## 2016-09-05 ENCOUNTER — Encounter (HOSPITAL_COMMUNITY): Payer: Self-pay | Admitting: *Deleted

## 2016-09-05 DIAGNOSIS — E0590 Thyrotoxicosis, unspecified without thyrotoxic crisis or storm: Secondary | ICD-10-CM | POA: Diagnosis not present

## 2016-09-05 DIAGNOSIS — E860 Dehydration: Secondary | ICD-10-CM

## 2016-09-05 DIAGNOSIS — E039 Hypothyroidism, unspecified: Secondary | ICD-10-CM | POA: Diagnosis not present

## 2016-09-05 DIAGNOSIS — Z79899 Other long term (current) drug therapy: Secondary | ICD-10-CM | POA: Diagnosis not present

## 2016-09-05 DIAGNOSIS — R030 Elevated blood-pressure reading, without diagnosis of hypertension: Secondary | ICD-10-CM | POA: Diagnosis not present

## 2016-09-05 DIAGNOSIS — R234 Changes in skin texture: Secondary | ICD-10-CM | POA: Diagnosis not present

## 2016-09-05 DIAGNOSIS — R42 Dizziness and giddiness: Secondary | ICD-10-CM | POA: Diagnosis not present

## 2016-09-05 LAB — CBC WITH DIFFERENTIAL/PLATELET
Basophils Absolute: 0 10*3/uL (ref 0.0–0.1)
Basophils Relative: 0 %
EOS PCT: 0 %
Eosinophils Absolute: 0 10*3/uL (ref 0.0–0.7)
HCT: 33.4 % — ABNORMAL LOW (ref 36.0–46.0)
Hemoglobin: 11.4 g/dL — ABNORMAL LOW (ref 12.0–15.0)
LYMPHS ABS: 1.6 10*3/uL (ref 0.7–4.0)
LYMPHS PCT: 27 %
MCH: 24.9 pg — AB (ref 26.0–34.0)
MCHC: 34.1 g/dL (ref 30.0–36.0)
MCV: 72.9 fL — AB (ref 78.0–100.0)
MONO ABS: 0.3 10*3/uL (ref 0.1–1.0)
MONOS PCT: 5 %
Neutro Abs: 3.9 10*3/uL (ref 1.7–7.7)
Neutrophils Relative %: 67 %
Platelets: 190 10*3/uL (ref 150–400)
RBC: 4.58 MIL/uL (ref 3.87–5.11)
RDW: 14.1 % (ref 11.5–15.5)
WBC: 5.8 10*3/uL (ref 4.0–10.5)

## 2016-09-05 LAB — TSH: TSH: 0.148 u[IU]/mL — ABNORMAL LOW (ref 0.350–4.500)

## 2016-09-05 LAB — COMPREHENSIVE METABOLIC PANEL
ALK PHOS: 66 U/L (ref 38–126)
ALT: 27 U/L (ref 14–54)
AST: 27 U/L (ref 15–41)
Albumin: 4.2 g/dL (ref 3.5–5.0)
Anion gap: 9 (ref 5–15)
BUN: 9 mg/dL (ref 6–20)
CHLORIDE: 105 mmol/L (ref 101–111)
CO2: 25 mmol/L (ref 22–32)
CREATININE: 0.72 mg/dL (ref 0.44–1.00)
Calcium: 9.2 mg/dL (ref 8.9–10.3)
GFR calc Af Amer: >60 mL/min (ref >60)
GFR calc non Af Amer: >60 mL/min (ref >60)
Glucose, Bld: 93 mg/dL (ref 65–99)
Potassium: 3.6 mmol/L (ref 3.5–5.1)
SODIUM: 139 mmol/L (ref 135–145)
Total Bilirubin: 0.6 mg/dL (ref 0.3–1.2)
Total Protein: 7.7 g/dL (ref 6.5–8.1)

## 2016-09-05 MED ORDER — SODIUM CHLORIDE 0.9 % IV BOLUS (SEPSIS)
1000.0000 mL | Freq: Once | INTRAVENOUS | Status: AC
  Administered 2016-09-05: 1000 mL via INTRAVENOUS

## 2016-09-05 MED ORDER — ONDANSETRON 4 MG PO TBDP: 4.0000 mg | ORAL_TABLET | Freq: Three times a day (TID) | ORAL | 0 refills | Status: DC | PRN

## 2016-09-05 NOTE — ED Triage Notes (Signed)
Pt complains of chills, dizziness, facial pain. Pt was seen at urgent care today for sinusitis. Pt also complains of stomach cramping for the past couple days. Pt denies n/v/d.

## 2016-09-05 NOTE — ED Provider Notes (Signed)
[facility]-EMERGENCY DEPT Provider Note   CSN: [id number] Arrival date & time: 09/05/16  1603     History   Chief Complaint Chief Complaint  Patient presents with  . Facial Pain  . Chills  . Abdominal Cramping    HPI Lauren Bolton is a 56 y.o. female.  HPI Patient presents with several different complaints. Chills lightheadedness/dizziness. Had some facial pain also. Has had abdominal cramping and some constipation. Seen at urgent care today and sent to the ER for further evaluation. Reportedly has had fatigue. Decreased oral intake. Think she is dehydrated. Has been seen by gastroenterology for the same. Has had EGDs and liver evaluations. Also has thyroid problems. Also history of some psychiatric disorders and also tend to show as GI problems. Past Medical History:  Diagnosis Date  . Anxiety   . Depression   . Iron deficiency anemia   . Thyroid disease     Patient Active Problem List   Diagnosis Date Noted  . MDD (major depressive disorder), recurrent episode, severe (HCC) 02/06/2016  . Adjustment disorder with mixed anxiety and depressed mood 12/26/2015  . Malnutrition (HCC)   . Intractable vomiting 12/23/2015  . Hypokalemia 11/11/2015  . Iron deficiency anemia 11/11/2015  . Malaise and fatigue 11/11/2015  . Failure to thrive in adult 11/11/2015  . Protein-calorie malnutrition, severe 11/11/2015  . Nausea with vomiting 11/11/2015  . Anxiety state   . Decreased oral intake   . Hypothyroidism 11/10/2015    Past Surgical History:  Procedure Laterality Date  . ESOPHAGOGASTRODUODENOSCOPY N/A 11/13/2015   Procedure: ESOPHAGOGASTRODUODENOSCOPY (EGD);  Surgeon: Dorena Cookey, MD;  Location: [facility];  Service: Endoscopy;  Laterality: N/A;  . ESOPHAGOGASTRODUODENOSCOPY (EGD) WITH PROPOFOL N/A 12/26/2015   Procedure: ESOPHAGOGASTRODUODENOSCOPY (EGD) WITH PROPOFOL;  Surgeon: Bernette Redbird, MD;  Location: [facility] ENDOSCOPY;  Service: Endoscopy;  Laterality: N/A;  . PLACEMENT OF  BREAST IMPLANTS     20 years ago  . thyroid goiter      OB History    No data available       Home Medications    Prior to Admission medications   Medication Sig Start Date End Date Taking? Authorizing Provider  docusate calcium (SURFAK) 240 MG capsule Take 240 mg by mouth daily.    [provider]  docusate sodium (COLACE) 100 MG capsule Take 100 mg by mouth daily.    [provider]  iron polysaccharides (NIFEREX) 150 MG capsule Take 1 capsule (150 mg total) by mouth 2 (two) times daily. 02/06/16   Adonis Brook, NP  levothyroxine (SYNTHROID) 100 MCG tablet Take 1 tablet (100 mcg total) by mouth daily before breakfast. 02/07/16   Adonis Brook, NP  LORazepam (ATIVAN) 0.5 MG tablet Take 1 tablet (0.5 mg total) by mouth 3 (three) times daily. 07/02/16   Burnard Leigh, MD  mirtazapine (REMERON) 30 MG tablet Take 1 tablet (30 mg total) by mouth at bedtime. 08/18/16   Eksir, Bo Mcclintock, MD  ondansetron (ZOFRAN-ODT) 4 MG disintegrating tablet Take 1 tablet (4 mg total) by mouth every 8 (eight) hours as needed for nausea or vomiting. 09/05/16   Benjiman Core, MD  simethicone (MYLICON) 80 MG chewable tablet Chew 2 tablets (160 mg total) by mouth 4 (four) times daily as needed for flatulence. Patient not taking: Reported on 08/18/2016 02/06/16   Adonis Brook, NP  venlafaxine XR (EFFEXOR XR) 37.5 MG 24 hr capsule Take 1 capsule (37.5 mg total) by mouth daily. 08/18/16 08/18/17  Burnard Leigh, MD  Family History No family history on file.  Social History Social History  Substance Use Topics  . Smoking status: Never Smoker  . Smokeless tobacco: Never Used  . Alcohol use No     Allergies   Benadryl [diphenhydramine hcl] and Lactose intolerance (gi)   Review of Systems Review of Systems  Constitutional: Positive for appetite change.  HENT: Positive for congestion. Negative for dental problem, facial swelling and trouble swallowing.     Respiratory: Negative for shortness of breath.   Cardiovascular: Negative for chest pain.  Gastrointestinal: Positive for abdominal pain and constipation.  Genitourinary: Negative for pelvic pain.  Musculoskeletal: Positive for myalgias.  Skin: Negative for rash.  Neurological: Positive for headaches. Negative for seizures and weakness.  Psychiatric/Behavioral: Negative for dysphoric mood.     Physical Exam Updated Vital Signs BP (!) 141/79 (BP Location: Left Arm)   Pulse 80   Temp 97.9 F (36.6 C) (Oral)   Resp (!) 22   Ht 5' (1.524 m)   Wt 45.4 kg (100 lb)   SpO2 100%   BMI 19.53 kg/m   Physical Exam  Constitutional: She appears well-developed.  Patient looks somewhat under nourished  HENT:  Head: Atraumatic.  Mouth/Throat: No oropharyngeal exudate.  Neck: Neck supple.  Cardiovascular: Normal rate.   Pulmonary/Chest: Effort normal.  Abdominal: Soft.  Neurological: She is alert.  Skin: Skin is warm. Capillary refill takes less than 2 seconds.  Psychiatric: She has a normal mood and affect.     ED Treatments / Results  Labs (all labs ordered are listed, but only abnormal results are displayed) Labs Reviewed  TSH - Abnormal; Notable for the following:       Result Value   TSH 0.148 (*)    All other components within normal limits  CBC WITH DIFFERENTIAL/PLATELET - Abnormal; Notable for the following:    Hemoglobin 11.4 (*)    HCT 33.4 (*)    MCV 72.9 (*)    MCH 24.9 (*)    All other components within normal limits  COMPREHENSIVE METABOLIC PANEL    EKG  EKG Interpretation None       Radiology No results found.  Procedures Procedures (including critical care time)  Medications Ordered in ED Medications  sodium chloride 0.9 % bolus 1,000 mL (0 mLs Intravenous Stopped 09/05/16 1926)     Initial Impression / Assessment and Plan / ED Course  I have reviewed the triage vital signs and the nursing notes.  Pertinent labs & imaging results that were  available during my care of the patient were reviewed by me and considered in my medical decision making (see chart for details).     Patient with multiple complaints. Chills dizziness cramping. Slight nausea at times. Lab work reassuring but does have low TSH. Is on thyroid and has had recent changes in medications. Will need to follow her primary care doctor, Dr. Evlyn [facility]. His had previous extensive workup.  Final Clinical Impressions(s) / ED Diagnoses   Final diagnoses:  Dehydration  Hyperthyroidism    New Prescriptions New Prescriptions   ONDANSETRON (ZOFRAN-ODT) 4 MG DISINTEGRATING TABLET    Take 1 tablet (4 mg total) by mouth every 8 (eight) hours as needed for nausea or vomiting.     Benjiman CorePickering, Dacota Devall, MD 09/05/16 [phone]

## 2016-09-05 NOTE — Discharge Instructions (Signed)
Talk with Dr. Evlyn [facility] about adjusting your thyroid medication.

## 2016-09-16 ENCOUNTER — Telehealth (HOSPITAL_COMMUNITY): Payer: Self-pay

## 2016-09-16 NOTE — Telephone Encounter (Signed)
thank you for the update.

## 2016-09-16 NOTE — Telephone Encounter (Signed)
Patients husband is calling, patient is not taking the Effexor and she has not increased the Remeron to 30 mg - she is still taking 15 mg. He said they follow up in October and he just wanted you to know

## 2016-10-06 DIAGNOSIS — E890 Postprocedural hypothyroidism: Secondary | ICD-10-CM | POA: Diagnosis not present

## 2016-10-07 DIAGNOSIS — R7989 Other specified abnormal findings of blood chemistry: Secondary | ICD-10-CM | POA: Diagnosis not present

## 2016-10-13 ENCOUNTER — Ambulatory Visit (INDEPENDENT_AMBULATORY_CARE_PROVIDER_SITE_OTHER): Payer: BLUE CROSS/BLUE SHIELD | Admitting: Psychiatry

## 2016-10-13 VITALS — BP 122/68 | HR 69 | Ht 60.0 in | Wt 104.0 lb

## 2016-10-13 DIAGNOSIS — R5381 Other malaise: Secondary | ICD-10-CM

## 2016-10-13 DIAGNOSIS — F3341 Major depressive disorder, recurrent, in partial remission: Secondary | ICD-10-CM

## 2016-10-13 DIAGNOSIS — R450 Nervousness: Secondary | ICD-10-CM

## 2016-10-13 DIAGNOSIS — R5383 Other fatigue: Secondary | ICD-10-CM

## 2016-10-13 DIAGNOSIS — E0590 Thyrotoxicosis, unspecified without thyrotoxic crisis or storm: Secondary | ICD-10-CM | POA: Diagnosis not present

## 2016-10-13 DIAGNOSIS — Z789 Other specified health status: Secondary | ICD-10-CM | POA: Diagnosis not present

## 2016-10-13 DIAGNOSIS — R110 Nausea: Secondary | ICD-10-CM | POA: Diagnosis not present

## 2016-10-13 DIAGNOSIS — F419 Anxiety disorder, unspecified: Secondary | ICD-10-CM

## 2016-10-13 MED ORDER — LORAZEPAM 0.5 MG PO TABS: 0.5000 mg | ORAL_TABLET | Freq: Two times a day (BID) | ORAL | 2 refills | Status: DC

## 2016-10-13 MED ORDER — MIRTAZAPINE 15 MG PO TABS: 15.0000 mg | ORAL_TABLET | Freq: Every day | ORAL | 5 refills | Status: DC

## 2016-10-13 NOTE — Progress Notes (Signed)
[facility] MD/PA/NP OP Progress Note  10/13/2016 1:41 PM Lauren Bolton  MRN:  [id number]  Chief Complaint: med check  HPI: Lauren Bolton presents for med management follow-up. She tried one dose of Effexor and became nauseous and never attempted again. She has been taking 15 mg of Remeron, instead of the 30 mg of Remeron as prescribed. She continues on 0.5 mg of Ativan twice a day.  The patient presents with her husband and Nurse, learning disability. She continues to present with some periods of tearfulness, anxiety, but her appetite and sleep have been more consistent. She reports that she feels comfortable with the current regimen of Remeron and Ativan, and has a lot of apprehensions and anxiety about new medicine.  Reviewing her history, she is felt to tolerate fluoxetine, sertraline, Effexor, or any doses of Remeron above 30 mg. She wishes to keep Remeron at 15 mg because she feels like it helps with her sleep more, and based on the pharmacology this would be accurate.  She is willing to review the material I provided her on TMS, and if she has worsening mood symptoms we can consider this avenue of treatment.  There've not been any acute safety issues at home. Husband reports that she's been doing okay. They agree to follow up in 3 months.  She has been at least maintaining her current weight, and her husband reports that she does tend to eat many small meals throughout the day. She tends to stay home, and enjoys periodic activities with her husband and especially enjoys playing football on her phone.  Visit Diagnosis:    ICD-10-CM   1. Medication intolerance Z78.9    Patient has been tried on multiple Prozac, Zoloft, and Effexor with significant nausea and somatic complaints.  2. Recurrent major depressive disorder, in partial remission (HCC) F33.41 mirtazapine (REMERON) 15 MG tablet    LORazepam (ATIVAN) 0.5 MG tablet    Past Psychiatric History: See intake H&P for full details. Reviewed, with no updates at this  time.   Past Medical History:  Past Medical History:  Diagnosis Date  . Anxiety   . Depression   . Iron deficiency anemia   . Thyroid disease     Past Surgical History:  Procedure Laterality Date  . ESOPHAGOGASTRODUODENOSCOPY N/A 11/13/2015   Procedure: ESOPHAGOGASTRODUODENOSCOPY (EGD);  Surgeon: Dorena Cookey, MD;  Location: [facility];  Service: Endoscopy;  Laterality: N/A;  . ESOPHAGOGASTRODUODENOSCOPY (EGD) WITH PROPOFOL N/A 12/26/2015   Procedure: ESOPHAGOGASTRODUODENOSCOPY (EGD) WITH PROPOFOL;  Surgeon: Bernette Redbird, MD;  Location: [facility];  Service: Endoscopy;  Laterality: N/A;  . PLACEMENT OF BREAST IMPLANTS     20 years ago  . thyroid goiter      Family Psychiatric History: See intake H&P for full details. Reviewed, with no updates at this time.   Family History: No family history on file.  Social History:  Social History   Social History  . Marital status: Married    Spouse name: N/A  . Number of children: N/A  . Years of education: N/A   Social History Main Topics  . Smoking status: Never Smoker  . Smokeless tobacco: Never Used  . Alcohol use No  . Drug use: No  . Sexual activity: Not Currently   Other Topics Concern  . Not on file   Social History Narrative  . No narrative on file    Allergies:  Allergies  Allergen Reactions  . Benadryl [Diphenhydramine Hcl] Diarrhea and Other (See Comments)    Reaction:  Makes her  excessively sleepy   . Lactose Intolerance (Gi)     Metabolic Disorder Labs: No results found for: HGBA1C, MPG No results found for: PROLACTIN No results found for: CHOL, TRIG, HDL, CHOLHDL, VLDL, LDLCALC Lab Results  Component Value Date   TSH 0.148 (L) 09/05/2016   TSH 0.366 12/23/2015    Therapeutic Level Labs: No results found for: LITHIUM No results found for: VALPROATE No components found for:  CBMZ  Current Medications: Current Outpatient Prescriptions  Medication Sig Dispense Refill  . docusate calcium  (SURFAK) 240 MG capsule Take 240 mg by mouth daily.    Marland Kitchen docusate sodium (COLACE) 100 MG capsule Take 100 mg by mouth daily.    . iron polysaccharides (NIFEREX) 150 MG capsule Take 1 capsule (150 mg total) by mouth 2 (two) times daily. 60 capsule 0  . levothyroxine (SYNTHROID) 100 MCG tablet Take 1 tablet (100 mcg total) by mouth daily before breakfast. 30 tablet 0  . LORazepam (ATIVAN) 0.5 MG tablet Take 1 tablet (0.5 mg total) by mouth 2 (two) times daily. 60 tablet 2  . mirtazapine (REMERON) 15 MG tablet Take 1 tablet (15 mg total) by mouth at bedtime. 30 tablet 5  . ondansetron (ZOFRAN-ODT) 4 MG disintegrating tablet Take 1 tablet (4 mg total) by mouth every 8 (eight) hours as needed for nausea or vomiting. 8 tablet 0  . simethicone (MYLICON) 80 MG chewable tablet Chew 2 tablets (160 mg total) by mouth 4 (four) times daily as needed for flatulence. (Patient not taking: Reported on 08/18/2016) 30 tablet 0   No current facility-administered medications for this visit.      Musculoskeletal: Strength & Muscle Tone: within normal limits Gait & Station: normal Patient leans: N/A  Psychiatric Specialty Exam: Review of Systems  Constitutional: Positive for malaise/fatigue.  HENT: Negative.   Respiratory: Negative.   Cardiovascular: Negative.   Gastrointestinal: Positive for nausea.  Musculoskeletal: Negative.   Neurological: Negative.   Psychiatric/Behavioral: Positive for depression. The patient is nervous/anxious.     Blood pressure 122/68, pulse 69, height 5' (1.524 m), weight 104 lb (47.2 kg).Body mass index is 20.31 kg/m.  General Appearance: Casual and Fairly Groomed  Eye Contact:  Fair  Speech:  Clear and Coherent  Volume:  Normal  Mood:  Dysphoric  Affect:  Constricted  Thought Process:  Goal Directed  Orientation:  Full (Time, Place, and Person)  Thought Content: Logical   Suicidal Thoughts:  No  Homicidal Thoughts:  No  Memory:  Immediate;   Fair  Judgement:  Fair   Insight:  Shallow  Psychomotor Activity:  Normal  Concentration:  Attention Span: Fair  Recall:  [employer] of Knowledge: Fair  Language: Fair  Akathisia:  Negative  Handed:  Right  AIMS (if indicated): not done  Assets:  Communication Skills Desire for Improvement Financial Resources/Insurance Housing Intimacy Social Support  ADL's:  Intact  Cognition: WNL  Sleep:  Good   Screenings: AIMS     [facility]  AIMS Total Score  0    AUDIT     Ad[facility]  Alcohol Use Disorder Identification Test Final Score (AUDIT)  0       Assessment and Plan:  Tabria Gerdeman is a 56 year old female with hypothyroidism currently on levothyroxin, chronic nausea and periods of malnutrition, and major depressive disorder. She tends to be somatically focused, and has not been able to  tolerate at least for psychiatric medications including Prozac, Zoloft, Effexor, and higher doses Remeron. She does not present any acute safety issues or suicidality. Her depressive symptoms continue on at this point, she feels that it's more manageable with the current regimen. Will proceed as below and follow up in 3 months.  I continue to suggest we proceed with TMS, but she is fairly anxious about the introduction of new therapies and interventions.  1. Medication intolerance   2. Recurrent major depressive disorder, in partial remission (HCC)    Continue Remeron 15 mg nightly Continue Ativan 0.5 mg twice daily as needed for anxiety and panic Return to clinic in 3 months Continue to recommend TMS versus ECT for depression given intolerance of multiple medications  I spent 30 minutes with the patient in direct care and counseling. We discussed medication intolerance, her level of function at home, ongoing areas of struggle, and treatment recommendations moving forward.  Lauren Bolton translator was present for the interaction.   Burnard Leigh, MD 10/13/2016, 1:41 PM

## 2016-11-30 DIAGNOSIS — R634 Abnormal weight loss: Secondary | ICD-10-CM | POA: Diagnosis not present

## 2016-11-30 DIAGNOSIS — E7849 Other hyperlipidemia: Secondary | ICD-10-CM | POA: Diagnosis not present

## 2016-11-30 DIAGNOSIS — R740 Nonspecific elevation of levels of transaminase and lactic acid dehydrogenase [LDH]: Secondary | ICD-10-CM | POA: Diagnosis not present

## 2016-11-30 DIAGNOSIS — R82998 Other abnormal findings in urine: Secondary | ICD-10-CM | POA: Diagnosis not present

## 2016-11-30 DIAGNOSIS — D6489 Other specified anemias: Secondary | ICD-10-CM | POA: Diagnosis not present

## 2016-11-30 DIAGNOSIS — F4323 Adjustment disorder with mixed anxiety and depressed mood: Secondary | ICD-10-CM | POA: Diagnosis not present

## 2016-11-30 DIAGNOSIS — E43 Unspecified severe protein-calorie malnutrition: Secondary | ICD-10-CM | POA: Diagnosis not present

## 2016-11-30 DIAGNOSIS — E890 Postprocedural hypothyroidism: Secondary | ICD-10-CM | POA: Diagnosis not present

## 2017-01-18 ENCOUNTER — Ambulatory Visit (HOSPITAL_COMMUNITY): Payer: Self-pay | Admitting: Psychiatry

## 2017-02-02 DIAGNOSIS — J069 Acute upper respiratory infection, unspecified: Secondary | ICD-10-CM | POA: Diagnosis not present

## 2017-02-02 DIAGNOSIS — R030 Elevated blood-pressure reading, without diagnosis of hypertension: Secondary | ICD-10-CM | POA: Diagnosis not present

## 2017-03-03 DIAGNOSIS — K3189 Other diseases of stomach and duodenum: Secondary | ICD-10-CM | POA: Diagnosis not present

## 2017-03-18 ENCOUNTER — Ambulatory Visit (INDEPENDENT_AMBULATORY_CARE_PROVIDER_SITE_OTHER): Payer: Self-pay | Admitting: Psychiatry

## 2017-03-18 ENCOUNTER — Encounter (HOSPITAL_COMMUNITY): Payer: Self-pay | Admitting: Psychiatry

## 2017-03-18 VITALS — BP 112/70 | HR 80 | Ht 60.0 in | Wt 109.4 lb

## 2017-03-18 DIAGNOSIS — E034 Atrophy of thyroid (acquired): Secondary | ICD-10-CM

## 2017-03-18 DIAGNOSIS — F3341 Major depressive disorder, recurrent, in partial remission: Secondary | ICD-10-CM

## 2017-03-18 MED ORDER — MIRTAZAPINE 15 MG PO TABS: 15.0000 mg | ORAL_TABLET | Freq: Every day | ORAL | 5 refills | Status: DC

## 2017-03-18 MED ORDER — LORAZEPAM 0.5 MG PO TABS: 0.5000 mg | ORAL_TABLET | Freq: Two times a day (BID) | ORAL | 2 refills | Status: DC

## 2017-03-18 NOTE — Progress Notes (Signed)
[facility] MD/PA/NP OP Progress Note  03/18/2017 3:24 PM Lauren Bolton  MRN:  [id number]  Chief Complaint: med management  HPI: Lauren Bolton presents with ongoing feelings of shakiness and anxiety. Feels ativan helps with anxiety but feels physically uncomfortable and would like referral for second opinion for her thyroid management.  Feels uncomfortable with current thyroid management by primary provider.  Sleeping well with remeron and ativan. No acute SI, Hi,AVH, PI.  Agrees for referal to [facility] for specialty evaluation.  Visit Diagnosis:    ICD-10-CM   1. Hypothyroidism due to acquired atrophy of thyroid E03.4 Ambulatory referral to Endocrinology    CANCELED: Ambulatory referral to Endocrinology  2. Recurrent major depressive disorder, in partial remission (HCC) F33.41 mirtazapine (REMERON) 15 MG tablet    LORazepam (ATIVAN) 0.5 MG tablet    Ambulatory referral to Endocrinology    Past Psychiatric History: See intake H&P for full details. Reviewed, with no updates at this time.   Past Medical History:  Past Medical History:  Diagnosis Date  . Anxiety   . Depression   . Iron deficiency anemia   . Thyroid disease     Past Surgical History:  Procedure Laterality Date  . ESOPHAGOGASTRODUODENOSCOPY N/A 11/13/2015   Procedure: ESOPHAGOGASTRODUODENOSCOPY (EGD);  Surgeon: Dorena CookeyJohn Hayes, MD;  [facility];  Service: Endoscopy;  Laterality: N/A;  . ESOPHAGOGASTRODUODENOSCOPY (EGD) WITH PROPOFOL N/A 12/26/2015   Procedure: ESOPHAGOGASTRODUODENOSCOPY (EGD) WITH PROPOFOL;  Surgeon: Bernette Redbirdobert Buccini, MD;  [facility];  Service: Endoscopy;  Laterality: N/A;  . PLACEMENT OF BREAST IMPLANTS     20 years ago  . thyroid goiter      Family Psychiatric History: See intake H&P for full details. Reviewed, with no updates at this time.   Family History: History reviewed. No pertinent family history.  Social History:  Social History   Socioeconomic History  . Marital  status: Married    Spouse name: None  . Number of children: None  . Years of education: None  . Highest education level: None  Social Needs  . Financial resource strain: None  . Food insecurity - worry: None  . Food insecurity - inability: None  . Transportation needs - medical: None  . Transportation needs - non-medical: None  Occupational History  . None  Tobacco Use  . Smoking status: Never Smoker  . Smokeless tobacco: Never Used  Substance and Sexual Activity  . Alcohol use: No  . Drug use: No  . Sexual activity: Not Currently  Other Topics Concern  . None  Social History Narrative  . None    Allergies:  Allergies  Allergen Reactions  . Benadryl [Diphenhydramine Hcl] Diarrhea and Other (See Comments)    Reaction:  Makes her excessively sleepy   . Lactose Intolerance (Gi)     Metabolic Disorder Labs: No results found for: HGBA1C, MPG No results found for: PROLACTIN No results found for: CHOL, TRIG, HDL, CHOLHDL, VLDL, LDLCALC Lab Results  Component Value Date   TSH 0.148 (L) 09/05/2016   TSH 0.366 12/23/2015    Therapeutic Level Labs: No results found for: LITHIUM No results found for: VALPROATE No components found for:  CBMZ  Current Medications: Current Outpatient Medications  Medication Sig Dispense Refill  . docusate calcium (SURFAK) 240 MG capsule Take 240 mg by mouth daily.    Marland Kitchen. docusate sodium (COLACE) 100 MG capsule Take 100 mg by mouth daily.    . iron polysaccharides (NIFEREX) 150 MG capsule Take 1 capsule (150 mg total)  by mouth 2 (two) times daily. 60 capsule 0  . levothyroxine (SYNTHROID) 100 MCG tablet Take 1 tablet (100 mcg total) by mouth daily before breakfast. 30 tablet 0  . LORazepam (ATIVAN) 0.5 MG tablet Take 1 tablet (0.5 mg total) by mouth 2 (two) times daily. 60 tablet 2  . mirtazapine (REMERON) 15 MG tablet Take 1 tablet (15 mg total) by mouth at bedtime. 30 tablet 5  . ondansetron (ZOFRAN-ODT) 4 MG disintegrating tablet Take 1  tablet (4 mg total) by mouth every 8 (eight) hours as needed for nausea or vomiting. (Patient not taking: Reported on 03/18/2017) 8 tablet 0  . simethicone (MYLICON) 80 MG chewable tablet Chew 2 tablets (160 mg total) by mouth 4 (four) times daily as needed for flatulence. (Patient not taking: Reported on 08/18/2016) 30 tablet 0   No current facility-administered medications for this visit.      Musculoskeletal: Strength & Muscle Tone: within normal limits Gait & Station: normal Patient leans: N/A  Psychiatric Specialty Exam: ROS  Blood pressure 112/70, pulse 80, height 5' (1.524 m), weight 109 lb 6.4 oz (49.6 kg).Body mass index is 21.37 kg/m.  General Appearance: Casual and Fairly Groomed  Eye Contact:  Fair  Speech:  Normal Rate  Volume:  Normal  Mood:  Anxious  Affect:  Congruent  Thought Process:  Goal Directed and Descriptions of Associations: Intact  Orientation:  Full (Time, Place, and Person)  Thought Content: Logical   Suicidal Thoughts:  No  Homicidal Thoughts:  No  Memory:  Immediate;   Fair  Judgement:  Fair  Insight:  Fair  Psychomotor Activity:  Normal  Concentration:  Attention Span: Fair  Recall:  [employer] of Knowledge: Fair  Language: Fair  Akathisia:  Negative  Handed:  Right  AIMS (if indicated): not done  Assets:  Communication Skills Desire for Improvement Financial Resources/Insurance Housing  ADL's:  Intact  Cognition: WNL  Sleep:  Good   Screenings: AIMS     [facility]  AIMS Total Score  0    AUDIT     Ad[facility] (Discharged[facility]  Alcohol Use Disorder Identification Test Final Score (AUDIT)  0       Assessment and Plan:  Lauren Bolton presents with ongoing mood/anxiety symptoms. I am primarily concerned about mood disorder 2/2 to medical illness.  Sleeping well with remeron and ativan for anxiety.  No  acute safety issues or other concerns.  Patient and husband request referral for endocrinology, which I agree may be helpful. Will follow-up in 4 months or sooner if needed.  1. Hypothyroidism due to acquired atrophy of thyroid   2. Recurrent major depressive disorder, in partial remission (HCC)     Status of current problems: unchanged  Labs Ordered: Orders Placed This Encounter  Procedures  . Ambulatory referral to Endocrinology    Referral Priority:   Routine    Referral Type:   Consultation    Referral Reason:   Specialty Services Required    Referred to Provider:   Romero Belling, MD    Number of Visits Requested:   1    Labs Reviewed: n/a  Collateral Obtained/Records Reviewed: husband present, corroborates mood/anxiety ongoing  Plan:  remeron 15 mg qhs Ativan 0.5 mg BID rtc 4 mo  I spent 20 minutes with the patient in direct face-to-face clinical care.  Greater than 50% of this time was spent  in counseling and coordination of care with the patient.    Burnard Leigh, MD 03/18/2017, 3:24 PM

## 2017-03-22 ENCOUNTER — Telehealth (HOSPITAL_COMMUNITY): Payer: Self-pay

## 2017-03-22 DIAGNOSIS — F3341 Major depressive disorder, recurrent, in partial remission: Secondary | ICD-10-CM

## 2017-03-22 NOTE — Telephone Encounter (Signed)
Yes that would be fine to use an additional tablet if needed. But I would advise she make efforts to use 2 times per day in general, since it can be sedating and habit forming.

## 2017-03-22 NOTE — Telephone Encounter (Signed)
Patients husband is calling to see if you can prescribe the Ativan tid, he said sometimes she needs 2 a day and sometimes she needs 3 a day. This causes her to run out early and the pharmacy will not fill. Please review and advise, thank you

## 2017-03-23 MED ORDER — LORAZEPAM 0.5 MG PO TABS: 0.5000 mg | ORAL_TABLET | Freq: Two times a day (BID) | ORAL | 2 refills | Status: DC

## 2017-03-23 NOTE — Telephone Encounter (Signed)
I resent the prescription for 75 tablets and changed the sig. I called patients husband to let him know

## 2017-03-30 DIAGNOSIS — F418 Other specified anxiety disorders: Secondary | ICD-10-CM | POA: Diagnosis not present

## 2017-03-30 DIAGNOSIS — R7989 Other specified abnormal findings of blood chemistry: Secondary | ICD-10-CM | POA: Diagnosis not present

## 2017-03-30 DIAGNOSIS — E43 Unspecified severe protein-calorie malnutrition: Secondary | ICD-10-CM | POA: Diagnosis not present

## 2017-03-30 DIAGNOSIS — D649 Anemia, unspecified: Secondary | ICD-10-CM | POA: Diagnosis not present

## 2017-03-30 DIAGNOSIS — E890 Postprocedural hypothyroidism: Secondary | ICD-10-CM | POA: Diagnosis not present

## 2017-03-30 DIAGNOSIS — R42 Dizziness and giddiness: Secondary | ICD-10-CM | POA: Diagnosis not present

## 2017-03-30 DIAGNOSIS — Z1389 Encounter for screening for other disorder: Secondary | ICD-10-CM | POA: Diagnosis not present

## 2017-04-08 DIAGNOSIS — Z01419 Encounter for gynecological examination (general) (routine) without abnormal findings: Secondary | ICD-10-CM | POA: Diagnosis not present

## 2017-04-08 DIAGNOSIS — Z6822 Body mass index (BMI) 22.0-22.9, adult: Secondary | ICD-10-CM | POA: Diagnosis not present

## 2017-04-08 DIAGNOSIS — Z1231 Encounter for screening mammogram for malignant neoplasm of breast: Secondary | ICD-10-CM | POA: Diagnosis not present

## 2017-04-26 ENCOUNTER — Telehealth (HOSPITAL_COMMUNITY): Payer: Self-pay

## 2017-04-26 NOTE — Telephone Encounter (Signed)
Medication management - Telephone call with patient and her husband, Rithy Jeffus to follow up on concern patient was only given #60 Ativan. Questioned if they gave the pharmacy the new Rx from 03/23/17 and collateral not sure.  Agreed to call pharmacy to follow-up and to see when last filled and when could be filled again with new prescription.  Agreed to call them back after.  Called [facility] on [address] to follow-up and learned they do have the new order on file but last refill was from a previous prescription for #60 per 30 days.  Questioned what they could do as they will be going out of town traveling and was informed the pharmacy would fill medication in 20 days.  Called patient's husband back to inform of this as he then reported they leave on their trip 05/05/17 and do not return until 06/02/17.  Dr. Rene KocherEksir then authorized patient to fill her Ativan with the correct order (#75) and her Remeron early on 05/03/17 prior to leaving for out of town.  Called [facility] back and spoke with Onalee HuaDavid to inform this was authorized by Dr. Rene KocherEksir and they agreed with plan.  Patient to call back if any problems filling the new Ativan order and Remeron on 05/03/17.

## 2017-06-14 ENCOUNTER — Encounter (HOSPITAL_COMMUNITY): Payer: Self-pay

## 2017-06-14 ENCOUNTER — Other Ambulatory Visit: Payer: Self-pay

## 2017-06-14 ENCOUNTER — Emergency Department (HOSPITAL_COMMUNITY)
Admission: EM | Admit: 2017-06-14 | Discharge: 2017-06-14 | Disposition: A | Discharge status: Home / Self Care | Payer: BLUE CROSS/BLUE SHIELD | Attending: Emergency Medicine | Admitting: Emergency Medicine

## 2017-06-14 DIAGNOSIS — E039 Hypothyroidism, unspecified: Secondary | ICD-10-CM | POA: Diagnosis not present

## 2017-06-14 DIAGNOSIS — N390 Urinary tract infection, site not specified: Secondary | ICD-10-CM | POA: Diagnosis not present

## 2017-06-14 DIAGNOSIS — Z79899 Other long term (current) drug therapy: Secondary | ICD-10-CM | POA: Insufficient documentation

## 2017-06-14 DIAGNOSIS — R531 Weakness: Secondary | ICD-10-CM | POA: Diagnosis not present

## 2017-06-14 LAB — URINALYSIS, ROUTINE W REFLEX MICROSCOPIC
Bilirubin Urine: NEGATIVE
GLUCOSE, UA: NEGATIVE mg/dL
Ketones, ur: NEGATIVE mg/dL
Nitrite: NEGATIVE
PH: 7 (ref 5.0–8.0)
Protein, ur: NEGATIVE mg/dL
SPECIFIC GRAVITY, URINE: 1.004 — AB (ref 1.005–1.030)

## 2017-06-14 LAB — CBC
HCT: 37.1 % (ref 36.0–46.0)
Hemoglobin: 12.1 g/dL (ref 12.0–15.0)
MCH: 24.9 pg — AB (ref 26.0–34.0)
MCHC: 32.6 g/dL (ref 30.0–36.0)
MCV: 76.5 fL — AB (ref 78.0–100.0)
PLATELETS: 207 10*3/uL (ref 150–400)
RBC: 4.85 MIL/uL (ref 3.87–5.11)
RDW: 14.2 % (ref 11.5–15.5)
WBC: 4.7 10*3/uL (ref 4.0–10.5)

## 2017-06-14 LAB — BASIC METABOLIC PANEL
Anion gap: 10 (ref 5–15)
BUN: 13 mg/dL (ref 6–20)
CHLORIDE: 107 mmol/L (ref 101–111)
CO2: 24 mmol/L (ref 22–32)
CREATININE: 0.87 mg/dL (ref 0.44–1.00)
Calcium: 9.5 mg/dL (ref 8.9–10.3)
GFR calc Af Amer: >60 mL/min (ref >60)
GFR calc non Af Amer: >60 mL/min (ref >60)
Glucose, Bld: 117 mg/dL — ABNORMAL HIGH (ref 65–99)
Potassium: 3.9 mmol/L (ref 3.5–5.1)
SODIUM: 141 mmol/L (ref 135–145)

## 2017-06-14 LAB — TSH: TSH: 0.38 u[IU]/mL (ref 0.350–4.500)

## 2017-06-14 LAB — CBG MONITORING, ED: Glucose-Capillary: 86 mg/dL (ref 65–99)

## 2017-06-14 MED ORDER — SODIUM CHLORIDE 0.9 % IV BOLUS
1000.0000 mL | Freq: Once | INTRAVENOUS | Status: AC
  Administered 2017-06-14: 1000 mL via INTRAVENOUS

## 2017-06-14 MED ORDER — CEPHALEXIN 500 MG PO CAPS: 500.0000 mg | ORAL_CAPSULE | Freq: Four times a day (QID) | ORAL | 0 refills | Status: DC

## 2017-06-14 MED ORDER — SODIUM CHLORIDE 0.9 % IV SOLN
1.0000 g | Freq: Once | INTRAVENOUS | Status: AC
  Administered 2017-06-14: 1 g via INTRAVENOUS
  Filled 2017-06-14: qty 10

## 2017-06-14 NOTE — ED Triage Notes (Signed)
Patient c/o weakness, dizziness, and shaking. Patient reports a history of low iron. Patient states she called her PCP and was on vacation and was instructed to come to the ED.

## 2017-06-14 NOTE — ED Notes (Addendum)
Pt is alert and oriented x 4 and is verbally responsive. Pt escorted with spouse. Pt reports that she has chills, shakes, dizziness  and weakness. Pt reports that she has had similar episode in th past and reports that  Her thyroid medication needed adjustment. Pt  States symptoms started 2 days ago. Pt reports that she was in [place] end of May.

## 2017-06-14 NOTE — Discharge Instructions (Addendum)
Please return for any problem. Follow up with your regular physician as instructed in 2-3 days.

## 2017-06-14 NOTE — ED Provider Notes (Signed)
[facility]-EMERGENCY DEPT Provider Note   CSN: [id number] Arrival date & time: 06/14/17  1132     History   Chief Complaint Chief Complaint  Patient presents with  . Shaking  . Weakness  . Dizziness  . Chills    HPI Lauren Bolton is a 57 y.o. female.  57 year old female with prior history of anxiety and depression, anemia, and hypothyroidism presents with complaint of feeling weak and dizzy for the last 3 to 4 days.  She reports intermittent chills and shaking.  She denies any fever.  She denies any chest pain, shortness of breath, nausea, vomiting, abdominal pain, or other specific complaint.  She denies any urinary symptoms.    Chart review does reveal prior episodes of depression requiring intensive psychiatric therapy.  Patient does not endorse suicidal ideation or homicidal ideation at this time.  The history is provided by the patient and medical records.  Illness  This is a new problem. The current episode started more than 2 days ago. The problem occurs rarely. The problem has not changed since onset.Pertinent negatives include no chest pain, no abdominal pain, no headaches and no shortness of breath. Nothing aggravates the symptoms. Nothing relieves the symptoms.    Past Medical History:  Diagnosis Date  . Anxiety   . Depression   . Iron deficiency anemia   . Thyroid disease     Patient Active Problem List   Diagnosis Date Noted  . MDD (major depressive disorder), recurrent episode, severe (HCC) 02/06/2016  . Adjustment disorder with mixed anxiety and depressed mood 12/26/2015  . Malnutrition (HCC)   . Intractable vomiting 12/23/2015  . Hypokalemia 11/11/2015  . Iron deficiency anemia 11/11/2015  . Malaise and fatigue 11/11/2015  . Failure to thrive in adult 11/11/2015  . Protein-calorie malnutrition, severe 11/11/2015  . Nausea with vomiting 11/11/2015  . Anxiety state   . Decreased oral intake   . Hypothyroidism 11/10/2015     Past Surgical History:  Procedure Laterality Date  . ESOPHAGOGASTRODUODENOSCOPY N/A 11/13/2015   Procedure: ESOPHAGOGASTRODUODENOSCOPY (EGD);  Surgeon: Dorena Cookey, MD;  Location: [facility];  Service: Endoscopy;  Laterality: N/A;  . ESOPHAGOGASTRODUODENOSCOPY (EGD) WITH PROPOFOL N/A 12/26/2015   Procedure: ESOPHAGOGASTRODUODENOSCOPY (EGD) WITH PROPOFOL;  Surgeon: Bernette Redbird, MD;  Location: [facility] ENDOSCOPY;  Service: Endoscopy;  Laterality: N/A;  . PLACEMENT OF BREAST IMPLANTS     20 years ago  . thyroid goiter       OB History   None      Home Medications    Prior to Admission medications   Medication Sig Start Date End Date Taking? Authorizing Provider  Carboxymethylcellul-Glycerin (CLEAR EYES FOR DRY EYES OP) Place 1 drop into both eyes daily as needed (dry eyes).   Yes [provider]  Cholecalciferol (VITAMIN D PO) Take 1 tablet by mouth daily.   Yes [provider]  docusate sodium (COLACE) 100 MG capsule Take 100 mg by mouth daily.   Yes [provider]  FIBER ADULT GUMMIES PO Take 1 tablet by mouth daily.   Yes [provider]  ibuprofen (ADVIL,MOTRIN) 200 MG tablet Take 200-400 mg by mouth daily as needed for moderate pain.   Yes [provider]  iron polysaccharides (NIFEREX) 150 MG capsule Take 1 capsule (150 mg total) by mouth 2 (two) times daily. 02/06/16  Yes Adonis Brook, NP  levothyroxine (SYNTHROID) 100 MCG tablet Take 1 tablet (100 mcg total) by mouth daily before breakfast. 02/07/16  Yes Adonis Brook, NP  LORazepam (ATIVAN) 0.5 MG tablet Take 1 tablet (0.5 mg total) by mouth 2 (two) times daily. Take a third tablet daily if needed 03/23/17  Yes Eksir, Bo McclintockAlexander Arya, MD  mirtazapine (REMERON) 15 MG tablet Take 1 tablet (15 mg total) by mouth at bedtime. 03/18/17 03/18/18 Yes Eksir, Bo McclintockAlexander Arya, MD  PROTEIN PO Take by mouth. Mix one scoop with water and drink   Yes [provider]  cephALEXin (KEFLEX) 500  MG capsule Take 1 capsule (500 mg total) by mouth 4 (four) times daily. 06/14/17   Wynetta FinesMessick, Uilani Sanville C, MD  ondansetron (ZOFRAN-ODT) 4 MG disintegrating tablet Take 1 tablet (4 mg total) by mouth every 8 (eight) hours as needed for nausea or vomiting. Patient not taking: Reported on 03/18/2017 09/05/16   Benjiman CorePickering, Nathan, MD  simethicone [facility](MYLICON) 80 MG chewable tablet Chew 2 tablets (160 mg total) by mouth 4 (four) times daily as needed for flatulence. Patient not taking: Reported on 08/18/2016 02/06/16   Adonis BrookAgustin, Sheila, NP    Family History Family History  Problem Relation Age of Onset  . Hypertension Mother     Social History Social History   Tobacco Use  . Smoking status: Never Smoker  . Smokeless tobacco: Never Used  Substance Use Topics  . Alcohol use: No  . Drug use: No     Allergies   Benadryl [diphenhydramine hcl]; Alprazolam; and Lactose intolerance (gi)   Review of Systems Review of Systems  Constitutional: Positive for chills and fatigue. Negative for fever.  Respiratory: Negative for shortness of breath.   Cardiovascular: Negative for chest pain.  Gastrointestinal: Negative for abdominal pain.  Neurological: Negative for headaches.  All other systems reviewed and are negative.    Physical Exam Updated Vital Signs BP 128/76   Pulse 68   Temp 98.2 F (36.8 C) (Oral)   Resp (!) 23   Ht 5' (1.524 m)   Wt 49.9 kg (110 lb)   SpO2 100%   BMI 21.48 kg/m   Physical Exam  Constitutional: She is oriented to person, place, and time. She appears well-developed and well-nourished. No distress.  HENT:  Head: Normocephalic and atraumatic.  Mouth/Throat: Oropharynx is clear and moist.  Eyes: Pupils are equal, round, and reactive to light. Conjunctivae and EOM are normal.  Neck: Normal range of motion. Neck supple.  Cardiovascular: Normal rate, regular rhythm and normal heart sounds.  Pulmonary/Chest: Effort normal and breath sounds normal. No respiratory distress.   Abdominal: Soft. She exhibits no distension. There is no tenderness.  Musculoskeletal: Normal range of motion. She exhibits no edema or deformity.  Neurological: She is alert and oriented to person, place, and time.  Skin: Skin is warm and dry.  Psychiatric: She has a normal mood and affect.  Nursing note and vitals reviewed.    ED Treatments / Results  Labs (all labs ordered are listed, but only abnormal results are displayed) Labs Reviewed  BASIC METABOLIC PANEL - Abnormal; Notable for the following components:      Result Value   Glucose, Bld 117 (*)    All other components within normal limits  CBC - Abnormal; Notable for the following components:   MCV 76.5 (*)    MCH 24.9 (*)    All other components within normal limits  URINALYSIS, ROUTINE W REFLEX MICROSCOPIC - Abnormal; Notable for the following components:   Color, Urine STRAW (*)    Specific Gravity, Urine 1.004 (*)    Hgb urine dipstick SMALL (*)  Leukocytes, UA TRACE (*)    Bacteria, UA MANY (*)    All other components within normal limits  CBG MONITORING, ED    EKG EKG Interpretation  Date/Time:  Monday June 14 2017 12:05:53 EDT Ventricular Rate:  92 PR Interval:    QRS Duration: 82 QT Interval:  381 QTC Calculation: 472 R Axis:   71 Text Interpretation:  Sinus rhythm Probable anteroseptal infarct, old Baseline wander in lead(s) V5 Confirmed by Kristine Royal [id number]) on 06/14/2017 1:28:56 PM   Radiology No results found.  Procedures Procedures (including critical care time)  Medications Ordered in ED Medications  sodium chloride 0.9 % bolus 1,000 mL (1,000 mLs Intravenous New Bag/Given 06/14/17 1345)  cefTRIAXone (ROCEPHIN) 1 g in sodium chloride 0.9 % 100 mL IVPB (0 g Intravenous Stopped 06/14/17 1455)     Initial Impression / Assessment and Plan / ED Course  I have reviewed the triage vital signs and the nursing notes.  Pertinent labs & imaging results that were available during my care of  the patient were reviewed by me and considered in my medical decision making (see chart for details).     MDM  Screen complete  Patient is presenting for evaluation of reported chills, weakness, and dizziness.  Patient's symptoms may be related to a possible early UTI.  Patient given IV fluids and antibiotics in the ED.  She will be discharged with a course of antibiotics to cover a potential UTI.  An element of her symptoms may be secondary to psychiatric causes.  However, she does not endorse SI or HI.  She does not appear to meet criteria at this time for IVC.  She is stable for discharge.  Close follow-up is advised.  Strict return precautions are given and understood.  Final Clinical Impressions(s) / ED Diagnoses   Final diagnoses:  Urinary tract infection without hematuria, site unspecified    ED Discharge Orders        Ordered    cephALEXin (KEFLEX) 500 MG capsule  4 times daily     06/14/17 1509       Wynetta Fines, MD 06/14/17 1517

## 2017-06-22 ENCOUNTER — Encounter (HOSPITAL_COMMUNITY): Payer: Self-pay | Admitting: Psychiatry

## 2017-06-22 ENCOUNTER — Ambulatory Visit (INDEPENDENT_AMBULATORY_CARE_PROVIDER_SITE_OTHER): Payer: Self-pay | Admitting: Psychiatry

## 2017-06-22 VITALS — BP 126/72 | HR 78 | Ht 60.0 in | Wt 110.0 lb

## 2017-06-22 DIAGNOSIS — F3341 Major depressive disorder, recurrent, in partial remission: Secondary | ICD-10-CM

## 2017-06-22 DIAGNOSIS — R6889 Other general symptoms and signs: Secondary | ICD-10-CM

## 2017-06-22 MED ORDER — LORAZEPAM 0.5 MG PO TABS: 0.5000 mg | ORAL_TABLET | Freq: Two times a day (BID) | ORAL | 2 refills | Status: DC

## 2017-06-22 NOTE — Progress Notes (Signed)
[facility] MD/PA/NP OP Progress Note  06/22/2017 4:30 PM Lauren Bolton  MRN:  [id number]  Chief Complaint: med management  HPI: Lauren Bolton presents after recent ED visit with possible UTI.  She continues to present with the same somatic complaints, but reports that the Remeron helps her sleep.  She wishes to continue on the current doses of Remeron and Ativan.  She continues to be very focused on her thyroid disease is the primary issue related to her physical complaints, and is focused on obtaining a second opinion from Dr. Everardo AllEllison which she will meet with in August.  No acute safety concerns from her husband.  Translator was present and utilized for the interview process.  Visit Diagnosis:    ICD-10-CM   1. Recurrent major depressive disorder, in partial remission (HCC) F33.41   2. Somatic complaints, multiple R68.89     Past Psychiatric History: See intake H&P for full details. Reviewed, with no updates at this time.   Past Medical History:  Past Medical History:  Diagnosis Date  . Anxiety   . Depression   . Iron deficiency anemia   . Thyroid disease     Past Surgical History:  Procedure Laterality Date  . ESOPHAGOGASTRODUODENOSCOPY N/A 11/13/2015   Procedure: ESOPHAGOGASTRODUODENOSCOPY (EGD);  Surgeon: Dorena CookeyJohn Hayes, MD;  [facility];  Service: Endoscopy;  Laterality: N/A;  . ESOPHAGOGASTRODUODENOSCOPY (EGD) WITH PROPOFOL N/A 12/26/2015   Procedure: ESOPHAGOGASTRODUODENOSCOPY (EGD) WITH PROPOFOL;  Surgeon: Bernette Redbirdobert Buccini, MD;  [facility];  Service: Endoscopy;  Laterality: N/A;  . PLACEMENT OF BREAST IMPLANTS     20 years ago  . thyroid goiter      Family Psychiatric History: See intake H&P for full details. Reviewed, with no updates at this time.   Family History:  Family History  Problem Relation Age of Onset  . Hypertension Mother     Social History:  Social History   Socioeconomic History  . Marital status: Married    Spouse name: Not on file  .  Number of children: Not on file  . Years of education: Not on file  . Highest education level: Not on file  Occupational History  . Not on file  Social Needs  . Financial resource strain: Not on file  . Food insecurity:    Worry: Not on file    Inability: Not on file  . Transportation needs:    Medical: Not on file    Non-medical: Not on file  Tobacco Use  . Smoking status: Never Smoker  . Smokeless tobacco: Never Used  Substance and Sexual Activity  . Alcohol use: No  . Drug use: No  . Sexual activity: Not Currently  Lifestyle  . Physical activity:    Days per week: Not on file    Minutes per session: Not on file  . Stress: Not on file  Relationships  . Social connections:    Talks on phone: Not on file    Gets together: Not on file    Attends religious service: Not on file    Active member of club or organization: Not on file    Attends meetings of clubs or organizations: Not on file    Relationship status: Not on file  Other Topics Concern  . Not on file  Social History Narrative  . Not on file    Allergies:  Allergies  Allergen Reactions  . Benadryl [Diphenhydramine Hcl] Diarrhea and Other (See Comments)    Reaction:  Makes her excessively sleepy   .  Alprazolam Diarrhea  . Lactose Intolerance (Gi)     Metabolic Disorder Labs: No results found for: HGBA1C, MPG No results found for: PROLACTIN No results found for: CHOL, TRIG, HDL, CHOLHDL, VLDL, LDLCALC Lab Results  Component Value Date   TSH 0.380 06/14/2017   TSH 0.148 (L) 09/05/2016    Therapeutic Level Labs: No results found for: LITHIUM No results found for: VALPROATE No components found for:  CBMZ  Current Medications: Current Outpatient Medications  Medication Sig Dispense Refill  . Carboxymethylcellul-Glycerin (CLEAR EYES FOR DRY EYES OP) Place 1 drop into both eyes daily as needed (dry eyes).    . cephALEXin (KEFLEX) 500 MG capsule Take 1 capsule (500 mg total) by mouth 4 (four) times  daily. 28 capsule 0  . Cholecalciferol (VITAMIN D PO) Take 1 tablet by mouth daily.    Marland Kitchen docusate sodium (COLACE) 100 MG capsule Take 100 mg by mouth daily.    Marland Kitchen FIBER ADULT GUMMIES PO Take 1 tablet by mouth daily.    Marland Kitchen ibuprofen (ADVIL,MOTRIN) 200 MG tablet Take 200-400 mg by mouth daily as needed for moderate pain.    . iron polysaccharides (NIFEREX) 150 MG capsule Take 1 capsule (150 mg total) by mouth 2 (two) times daily. 60 capsule 0  . levothyroxine (SYNTHROID) 100 MCG tablet Take 1 tablet (100 mcg total) by mouth daily before breakfast. 30 tablet 0  . LORazepam (ATIVAN) 0.5 MG tablet Take 1 tablet (0.5 mg total) by mouth 2 (two) times daily. Take a third tablet daily if needed 75 tablet 2  . mirtazapine (REMERON) 15 MG tablet Take 1 tablet (15 mg total) by mouth at bedtime. 30 tablet 5  . ondansetron (ZOFRAN-ODT) 4 MG disintegrating tablet Take 1 tablet (4 mg total) by mouth every 8 (eight) hours as needed for nausea or vomiting. (Patient not taking: Reported on 03/18/2017) 8 tablet 0  . PROTEIN PO Take by mouth. Mix one scoop with water and drink    . simethicone (MYLICON) 80 MG chewable tablet Chew 2 tablets (160 mg total) by mouth 4 (four) times daily as needed for flatulence. (Patient not taking: Reported on 08/18/2016) 30 tablet 0   No current facility-administered medications for this visit.      Musculoskeletal: Strength & Muscle Tone: within normal limits Gait & Station: normal Patient leans: N/A  Psychiatric Specialty Exam: ROS  Blood pressure 126/72, pulse 78, height 5' (1.524 m), weight 110 lb (49.9 kg).Body mass index is 21.48 kg/m.  General Appearance: Casual and Fairly Groomed  Eye Contact:  Fair  Speech:  Normal Rate  Volume:  Normal  Mood:  Anxious  Affect:  Congruent  Thought Process:  Goal Directed and Descriptions of Associations: Intact  Orientation:  Full (Time, Place, and Person)  Thought Content: Logical   Suicidal Thoughts:  No  Homicidal Thoughts:  No   Memory:  Immediate;   Fair  Judgement:  Fair  Insight:  Fair  Psychomotor Activity:  Normal  Concentration:  Attention Span: Fair  Recall:  [employer] of Knowledge: Fair  Language: Fair  Akathisia:  Negative  Handed:  Right  AIMS (if indicated): not done  Assets:  Communication Skills Desire for Improvement Financial Resources/Insurance Housing  ADL's:  Intact  Cognition: WNL  Sleep:  Good   Screenings: AIMS     [facility]  AIMS Total Score  0    AUDIT     Ad[facility] ([facility]  Alcohol Use Disorder Identification Test Final Score (AUDIT)  0       Assessment and Plan:  Lauren Bolton presents with ongoing somatic complaints.  She is quite apprehensive about any new medications or changes in her regimen, but agrees to continue Remeron and Ativan as below.  We will follow-up in 2-3 months and transition care to new provider at that time as Clinical research associate is transitioning out of office.  1. Recurrent major depressive disorder, in partial remission (HCC)   2. Somatic complaints, multiple     Status of current problems: unchanged  Labs Ordered: No orders of the defined types were placed in this encounter.   Labs Reviewed: n/a  Collateral Obtained/Records Reviewed: husband present, corroborates mood/anxiety ongoing  Plan:  remeron 15 mg qhs Ativan 0.5 mg BID rtc 2-3 mo  I spent 20 minutes with the patient in direct face-to-face clinical care.  Greater than 50% of this time was spent in counseling and coordination of care with the patient.    Burnard Leigh, MD 06/22/2017, 4:30 PM

## 2017-06-24 DIAGNOSIS — Z13820 Encounter for screening for osteoporosis: Secondary | ICD-10-CM | POA: Diagnosis not present

## 2017-07-01 DIAGNOSIS — R5381 Other malaise: Secondary | ICD-10-CM | POA: Diagnosis not present

## 2017-07-01 DIAGNOSIS — R0600 Dyspnea, unspecified: Secondary | ICD-10-CM | POA: Diagnosis not present

## 2017-07-01 DIAGNOSIS — E890 Postprocedural hypothyroidism: Secondary | ICD-10-CM | POA: Diagnosis not present

## 2017-08-05 DIAGNOSIS — F4323 Adjustment disorder with mixed anxiety and depressed mood: Secondary | ICD-10-CM | POA: Diagnosis not present

## 2017-08-05 DIAGNOSIS — R7301 Impaired fasting glucose: Secondary | ICD-10-CM | POA: Diagnosis not present

## 2017-08-05 DIAGNOSIS — R634 Abnormal weight loss: Secondary | ICD-10-CM | POA: Diagnosis not present

## 2017-08-05 DIAGNOSIS — E890 Postprocedural hypothyroidism: Secondary | ICD-10-CM | POA: Diagnosis not present

## 2017-08-05 DIAGNOSIS — R740 Nonspecific elevation of levels of transaminase and lactic acid dehydrogenase [LDH]: Secondary | ICD-10-CM | POA: Diagnosis not present

## 2017-08-19 ENCOUNTER — Other Ambulatory Visit (INDEPENDENT_AMBULATORY_CARE_PROVIDER_SITE_OTHER): Payer: BLUE CROSS/BLUE SHIELD

## 2017-08-19 ENCOUNTER — Encounter: Payer: Self-pay | Admitting: Endocrinology

## 2017-08-19 ENCOUNTER — Ambulatory Visit: Payer: BLUE CROSS/BLUE SHIELD | Admitting: Endocrinology

## 2017-08-19 VITALS — BP 124/80 | HR 82 | Ht <= 58 in | Wt 114.4 lb

## 2017-08-19 DIAGNOSIS — E034 Atrophy of thyroid (acquired): Secondary | ICD-10-CM

## 2017-08-19 LAB — T3, FREE: T3, Free: 3.2 pg/mL (ref 2.3–4.2)

## 2017-08-19 LAB — T4, FREE: Free T4: 1.26 ng/dL (ref 0.60–1.60)

## 2017-08-19 LAB — TSH: TSH: 0.7 u[IU]/mL (ref 0.35–4.50)

## 2017-08-19 NOTE — Patient Instructions (Addendum)
blood tests are requested for you today.  We'll let you know about the results. I would be happy to see you back here as needed.      Hypothyroidism Hypothyroidism is a disorder of the thyroid. The thyroid is a large gland that is located in the lower front of the neck. The thyroid releases hormones that control how the body works. With hypothyroidism, the thyroid does not make enough of these hormones. What are the causes? Causes of hypothyroidism may include:  Viral infections.  Pregnancy.  Your own defense system (immune system) attacking your thyroid.  Certain medicines.  Birth defects.  Past radiation treatments to your head or neck.  Past treatment with radioactive iodine.  Past surgical removal of part or all of your thyroid.  Problems with the gland that is located in the center of your brain (pituitary).  What are the signs or symptoms? Signs and symptoms of hypothyroidism may include:  Feeling as though you have no energy (lethargy).  Inability to tolerate cold.  Weight gain that is not explained by a change in diet or exercise habits.  Dry skin.  Coarse hair.  Menstrual irregularity.  Slowing of thought processes.  Constipation.  Sadness or depression.  How is this diagnosed? Your health care provider may diagnose hypothyroidism with blood tests and ultrasound tests. How is this treated? Hypothyroidism is treated with medicine that replaces the hormones that your body does not make. After you begin treatment, it may take several weeks for symptoms to go away. Follow these instructions at home:  Take medicines only as directed by your health care provider.  If you start taking any new medicines, tell your health care provider.  Keep all follow-up visits as directed by your health care provider. This is important. As your condition improves, your dosage needs may change. You will need to have blood tests regularly so that your health care provider  can watch your condition. Contact a health care provider if:  Your symptoms do not get better with treatment.  You are taking thyroid replacement medicine and: ? You sweat excessively. ? You have tremors. ? You feel anxious. ? You lose weight rapidly. ? You cannot tolerate heat. ? You have emotional swings. ? You have diarrhea. ? You feel weak. Get help right away if:  You develop chest pain.  You develop an irregular heartbeat.  You develop a rapid heartbeat. This information is not intended to replace advice given to you by your health care provider. Make sure you discuss any questions you have with your health care provider. Document Released: 12/22/2004 Document Revised: 05/30/2015 Document Reviewed: 05/09/2013 Elsevier Interactive Patient Education  2018 [employer]

## 2017-08-19 NOTE — Progress Notes (Signed)
Subjective:    Patient ID: Lauren Bolton, female    DOB: Aug 10, 1960, 57 y.o.   MRN: [id number]  HPI Pt is referred by Dr Rene KocherEksir, for hypothyroidism.  Pt reports hypothyroidism was dx'ed in approx 1990.  she has been on prescribed thyroid hormone therapy since then.  She had thyroidectomy in 2012, due to the mass effect of a benign goiter.  she has never taken kelp or any other type of non-prescribed thyroid product.  she has never had XRT to the neck.  she has never been on amiodarone or lithium.  She has slight tremor of the hands, and assoc anxiety.  She takes synthroid 100 mcg on Mo, Tu, We, Th, and 1/2 on Fr.  None on Sa or Su.  She says she takes as rx'ed.   Past Medical History:  Diagnosis Date  . Anxiety   . Depression   . Iron deficiency anemia   . Thyroid disease     Past Surgical History:  Procedure Laterality Date  . ESOPHAGOGASTRODUODENOSCOPY N/A 11/13/2015   Procedure: ESOPHAGOGASTRODUODENOSCOPY (EGD);  Surgeon: Dorena CookeyJohn Hayes, MD;  [facility];  Service: Endoscopy;  Laterality: N/A;  . ESOPHAGOGASTRODUODENOSCOPY (EGD) WITH PROPOFOL N/A 12/26/2015   Procedure: ESOPHAGOGASTRODUODENOSCOPY (EGD) WITH PROPOFOL;  Surgeon: Bernette Redbirdobert Buccini, MD;  [facility];  Service: Endoscopy;  Laterality: N/A;  . PLACEMENT OF BREAST IMPLANTS     20 years ago  . thyroid goiter      Social History   Socioeconomic History  . Marital status: Married    Spouse name: Not on file  . Number of children: Not on file  . Years of education: Not on file  . Highest education level: Not on file  Occupational History  . Not on file  Social Needs  . Financial resource strain: Not on file  . Food insecurity:    Worry: Not on file    Inability: Not on file  . Transportation needs:    Medical: Not on file    Non-medical: Not on file  Tobacco Use  . Smoking status: Never Smoker  . Smokeless tobacco: Never Used  Substance and Sexual Activity  . Alcohol use: No  . Drug use: No  .  Sexual activity: Not Currently  Lifestyle  . Physical activity:    Days per week: Not on file    Minutes per session: Not on file  . Stress: Not on file  Relationships  . Social connections:    Talks on phone: Not on file    Gets together: Not on file    Attends religious service: Not on file    Active member of club or organization: Not on file    Attends meetings of clubs or organizations: Not on file    Relationship status: Not on file  . Intimate partner violence:    Fear of current or ex partner: Not on file    Emotionally abused: Not on file    Physically abused: Not on file    Forced sexual activity: Not on file  Other Topics Concern  . Not on file  Social History Narrative  . Not on file    Current Outpatient Medications on File Prior to Visit  Medication Sig Dispense Refill  . Carboxymethylcellul-Glycerin (CLEAR EYES FOR DRY EYES OP) Place 1 drop into both eyes daily as needed (dry eyes).    . Cholecalciferol (VITAMIN D PO) Take 1 tablet by mouth daily.    Marland Kitchen. docusate sodium (COLACE) 100 MG capsule  Take 100 mg by mouth daily.    Marland Kitchen. FIBER ADULT GUMMIES PO Take 1 tablet by mouth daily.    . iron polysaccharides (NIFEREX) 150 MG capsule Take 1 capsule (150 mg total) by mouth 2 (two) times daily. 60 capsule 0  . levothyroxine (SYNTHROID) 100 MCG tablet Take 1 tablet (100 mcg total) by mouth daily before breakfast. 30 tablet 0  . LORazepam (ATIVAN) 0.5 MG tablet Take 1 tablet (0.5 mg total) by mouth 2 (two) times daily. Take a third tablet daily if needed 75 tablet 2  . mirtazapine (REMERON) 15 MG tablet Take 1 tablet (15 mg total) by mouth at bedtime. 30 tablet 5  . ondansetron (ZOFRAN-ODT) 4 MG disintegrating tablet Take 1 tablet (4 mg total) by mouth every 8 (eight) hours as needed for nausea or vomiting. 8 tablet 0  . PROTEIN PO Take by mouth. Mix one scoop with water and drink    . simethicone (MYLICON) 80 MG chewable tablet Chew 2 tablets (160 mg total) by mouth 4 (four)  times daily as needed for flatulence. 30 tablet 0  . ibuprofen (ADVIL,MOTRIN) 200 MG tablet Take 200-400 mg by mouth daily as needed for moderate pain.     No current facility-administered medications on file prior to visit.     Allergies  Allergen Reactions  . Benadryl [Diphenhydramine Hcl] Diarrhea and Other (See Comments)    Reaction:  Makes her excessively sleepy   . Alprazolam Diarrhea  . Lactose Intolerance (Gi)     Family History  Problem Relation Age of Onset  . Hypertension Mother   . Thyroid disease Neg Hx     BP 124/80 (BP Location: Right Arm, Patient Position: Sitting, Cuff Size: Normal)   Pulse 82   Ht 4\' 10"  (1.473 m)   Wt 114 lb 6.4 oz (51.9 kg)   SpO2 99%   BMI 23.91 kg/m    Review of Systems denies depression, muscle cramps, sob, memory loss, numbness, myalgias, rhinorrhea, easy bruising, and syncope.  She has intermitt lightheadedness, cold intolerance, dry skin, constipation, weight gain, hair loss, and blurry vision.       Objective:   Physical Exam VS: see vs page GEN: no distress HEAD: head: no deformity eyes: no periorbital swelling, no proptosis external nose and ears are normal mouth: no lesion seen Neck: a healed scar is present.  I do not appreciate a nodule in the thyroid or elsewhere in the neck CHEST WALL: no deformity LUNGS: clear to auscultation CV: reg rate and rhythm, no murmur ABD: abdomen is soft, nontender.  no hepatosplenomegaly.  not distended.  no hernia MUSCULOSKELETAL: muscle bulk and strength are grossly normal.  no obvious joint swelling.  gait is normal and steady EXTEMITIES: no deformity.  no ulcer on the feet.  feet are of normal color and temp.  no edema PULSES: dorsalis pedis intact bilat.  no carotid bruit NEURO:  cn 2-12 grossly intact.   readily moves all 4's.  sensation is intact to touch on the feet SKIN:  Normal texture and temperature.  No rash or suspicious lesion is visible.   NODES:  None palpable at the  neck PSYCH: alert, well-oriented.  Does not appear anxious nor depressed.   I have reviewed outside records, and summarized: Pt was noted to have hypothyroidism, and referred here.  She was seen by psychiatry, for severe depression.    Lab Results  Component Value Date   TSH 0.70 08/19/2017      Assessment &  Plan:  Hypothyroidism: new to me: we discussed. she declines to change to a qd dosage.     Patient Instructions  blood tests are requested for you today.  We'll let you know about the results. I would be happy to see you back here as needed.      Hypothyroidism Hypothyroidism is a disorder of the thyroid. The thyroid is a large gland that is located in the lower front of the neck. The thyroid releases hormones that control how the body works. With hypothyroidism, the thyroid does not make enough of these hormones. What are the causes? Causes of hypothyroidism may include:  Viral infections.  Pregnancy.  Your own defense system (immune system) attacking your thyroid.  Certain medicines.  Birth defects.  Past radiation treatments to your head or neck.  Past treatment with radioactive iodine.  Past surgical removal of part or all of your thyroid.  Problems with the gland that is located in the center of your brain (pituitary).  What are the signs or symptoms? Signs and symptoms of hypothyroidism may include:  Feeling as though you have no energy (lethargy).  Inability to tolerate cold.  Weight gain that is not explained by a change in diet or exercise habits.  Dry skin.  Coarse hair.  Menstrual irregularity.  Slowing of thought processes.  Constipation.  Sadness or depression.  How is this diagnosed? Your health care provider may diagnose hypothyroidism with blood tests and ultrasound tests. How is this treated? Hypothyroidism is treated with medicine that replaces the hormones that your body does not make. After you begin treatment, it may take  several weeks for symptoms to go away. Follow these instructions at home:  Take medicines only as directed by your health care provider.  If you start taking any new medicines, tell your health care provider.  Keep all follow-up visits as directed by your health care provider. This is important. As your condition improves, your dosage needs may change. You will need to have blood tests regularly so that your health care provider can watch your condition. Contact a health care provider if:  Your symptoms do not get better with treatment.  You are taking thyroid replacement medicine and: ? You sweat excessively. ? You have tremors. ? You feel anxious. ? You lose weight rapidly. ? You cannot tolerate heat. ? You have emotional swings. ? You have diarrhea. ? You feel weak. Get help right away if:  You develop chest pain.  You develop an irregular heartbeat.  You develop a rapid heartbeat. This information is not intended to replace advice given to you by your health care provider. Make sure you discuss any questions you have with your health care provider. Document Released: 12/22/2004 Document Revised: 05/30/2015 Document Reviewed: 05/09/2013 Elsevier Interactive Patient Education  2018 [employer].

## 2017-08-24 ENCOUNTER — Encounter (HOSPITAL_COMMUNITY): Payer: Self-pay | Admitting: Psychiatry

## 2017-08-24 ENCOUNTER — Ambulatory Visit (HOSPITAL_COMMUNITY): Payer: BLUE CROSS/BLUE SHIELD | Admitting: Psychiatry

## 2017-08-24 VITALS — BP 138/81 | HR 72 | Ht <= 58 in | Wt 112.0 lb

## 2017-08-24 DIAGNOSIS — F410 Panic disorder [episodic paroxysmal anxiety] without agoraphobia: Secondary | ICD-10-CM

## 2017-08-24 DIAGNOSIS — F411 Generalized anxiety disorder: Secondary | ICD-10-CM | POA: Diagnosis not present

## 2017-08-24 MED ORDER — ALPRAZOLAM 0.25 MG PO TABS: 0.2500 mg | ORAL_TABLET | Freq: Three times a day (TID) | ORAL | 2 refills | Status: DC | PRN

## 2017-08-24 NOTE — Progress Notes (Signed)
[facility] MD/PA/NP OP Progress Note  08/24/2017 2:27 PM Lauren Bolton  MRN:  [id number]  Chief Complaint: med management  HPI: Lauren Bolton presents for medication management.  She feels like the Remeron does not really do anything for her sleep or appetite anymore and wonders about discontinuing.  She would like to switch from Ativan to the Xanax as she finds that to be more helpful for her sleep in the past.  We agreed to discontinue Ativan and restart Xanax 0.25 mg.  She denies any other acute concerns.  She understands this is writer's last 2 weeks at this office and her transition of care will be to Dr. Michae KavaAgarwal in 3 months.  Visit Diagnosis:    ICD-10-CM   1. GAD (generalized anxiety disorder) F41.1 ALPRAZolam (XANAX) 0.25 MG tablet  2. Panic F41.0 ALPRAZolam (XANAX) 0.25 MG tablet    Past Psychiatric History: See intake H&P for full details. Reviewed, with no updates at this time.   Past Medical History:  Past Medical History:  Diagnosis Date  . Anxiety   . Depression   . Iron deficiency anemia   . Thyroid disease     Past Surgical History:  Procedure Laterality Date  . ESOPHAGOGASTRODUODENOSCOPY N/A 11/13/2015   Procedure: ESOPHAGOGASTRODUODENOSCOPY (EGD);  Surgeon: Dorena CookeyJohn Hayes, MD;  [facility];  Service: Endoscopy;  Laterality: N/A;  . ESOPHAGOGASTRODUODENOSCOPY (EGD) WITH PROPOFOL N/A 12/26/2015   Procedure: ESOPHAGOGASTRODUODENOSCOPY (EGD) WITH PROPOFOL;  Surgeon: Bernette Redbirdobert Buccini, MD;  [facility];  Service: Endoscopy;  Laterality: N/A;  . PLACEMENT OF BREAST IMPLANTS     20 years ago  . thyroid goiter      Family Psychiatric History: See intake H&P for full details. Reviewed, with no updates at this time.   Family History:  Family History  Problem Relation Age of Onset  . Hypertension Mother   . Thyroid disease Neg Hx     Social History:  Social History   Socioeconomic History  . Marital status: Married    Spouse name: Not on file  . Number  of children: Not on file  . Years of education: Not on file  . Highest education level: Not on file  Occupational History  . Not on file  Social Needs  . Financial resource strain: Not on file  . Food insecurity:    Worry: Not on file    Inability: Not on file  . Transportation needs:    Medical: Not on file    Non-medical: Not on file  Tobacco Use  . Smoking status: Never Smoker  . Smokeless tobacco: Never Used  Substance and Sexual Activity  . Alcohol use: No  . Drug use: No  . Sexual activity: Not Currently  Lifestyle  . Physical activity:    Days per week: Not on file    Minutes per session: Not on file  . Stress: Not on file  Relationships  . Social connections:    Talks on phone: Not on file    Gets together: Not on file    Attends religious service: Not on file    Active member of club or organization: Not on file    Attends meetings of clubs or organizations: Not on file    Relationship status: Not on file  Other Topics Concern  . Not on file  Social History Narrative  . Not on file    Allergies:  Allergies  Allergen Reactions  . Benadryl [Diphenhydramine Hcl] Diarrhea and Other (See Comments)    Reaction:  Makes her excessively sleepy   . Alprazolam Diarrhea  . Lactose Intolerance (Gi)     Metabolic Disorder Labs: No results found for: HGBA1C, MPG No results found for: PROLACTIN No results found for: CHOL, TRIG, HDL, CHOLHDL, VLDL, LDLCALC Lab Results  Component Value Date   TSH 0.70 08/19/2017   TSH 0.380 06/14/2017    Therapeutic Level Labs: No results found for: LITHIUM No results found for: VALPROATE No components found for:  CBMZ  Current Medications: Current Outpatient Medications  Medication Sig Dispense Refill  . Carboxymethylcellul-Glycerin (CLEAR EYES FOR DRY EYES OP) Place 1 drop into both eyes daily as needed (dry eyes).    . Cholecalciferol (VITAMIN D PO) Take 1 tablet by mouth daily.    Marland Kitchen. docusate sodium (COLACE) 100 MG  capsule Take 100 mg by mouth daily.    Marland Kitchen. FIBER ADULT GUMMIES PO Take 1 tablet by mouth daily.    Marland Kitchen. ibuprofen (ADVIL,MOTRIN) 200 MG tablet Take 200-400 mg by mouth daily as needed for moderate pain.    . iron polysaccharides (NIFEREX) 150 MG capsule Take 1 capsule (150 mg total) by mouth 2 (two) times daily. 60 capsule 0  . levothyroxine (SYNTHROID) 100 MCG tablet Take 1 tablet (100 mcg total) by mouth daily before breakfast. 30 tablet 0  . ondansetron (ZOFRAN-ODT) 4 MG disintegrating tablet Take 1 tablet (4 mg total) by mouth every 8 (eight) hours as needed for nausea or vomiting. 8 tablet 0  . PROTEIN PO Take by mouth. Mix one scoop with water and drink    . simethicone (MYLICON) 80 MG chewable tablet Chew 2 tablets (160 mg total) by mouth 4 (four) times daily as needed for flatulence. 30 tablet 0  . ALPRAZolam (XANAX) 0.25 MG tablet Take 1 tablet (0.25 mg total) by mouth 3 (three) times daily as needed for sleep or anxiety. 90 tablet 2   No current facility-administered medications for this visit.      Musculoskeletal: Strength & Muscle Tone: within normal limits Gait & Station: normal Patient leans: N/A  Psychiatric Specialty Exam: ROS  Blood pressure 138/81, pulse 72, height 4\' 10"  (1.473 m), weight 112 lb (50.8 kg), SpO2 100 %.Body mass index is 23.41 kg/m.  General Appearance: Casual and Fairly Groomed  Eye Contact:  Fair  Speech:  Normal Rate  Volume:  Normal  Mood:  Anxious  Affect:  Congruent  Thought Process:  Goal Directed and Descriptions of Associations: Intact  Orientation:  Full (Time, Place, and Person)  Thought Content: Logical   Suicidal Thoughts:  No  Homicidal Thoughts:  No  Memory:  Immediate;   Fair  Judgement:  Fair  Insight:  Fair  Psychomotor Activity:  Normal  Concentration:  Attention Span: Fair  Recall:  [employer]  Fund of Knowledge: Fair  Language: Fair  Akathisia:  Negative  Handed:  Right  AIMS (if indicated): not done  Assets:  Communication  Skills Desire for Improvement Financial Resources/Insurance Housing  ADL's:  Intact  Cognition: WNL  Sleep:  Good   Screenings: AIMS     [facility]  AIMS Total Score  0    AUDIT     [facility]  Alcohol Use Disorder Identification Test Final Score (AUDIT)  0       Assessment and Plan:  Dylan Welliver presents with ongoing anxiety and panic, particularly in the evening.  She tends to be  somatically preoccupied related to her thyroid disease but has been assessed by endocrinology and received a second opinion.  She appears to be generally stable on this current thyroid regimen and her most recent labs are normal.  She continues to struggle with poor sleep, so we agreed to switch her from Ativan to Xanax 0.25-0.5 mg nightly.  1. GAD (generalized anxiety disorder)   2. Panic     Status of current problems: unchanged  Labs Ordered: No orders of the defined types were placed in this encounter.   Labs Reviewed: n/a  Collateral Obtained/Records Reviewed: husband present, corroborates mood/anxiety ongoing  Plan:  Stop remeron given lack of ongoing benefit Xanax 0.25-0.5 mg nightly, okay to take additional tablet during day for anxiety rtc 2-3 mo  I spent 20 minutes with the patient in direct face-to-face clinical care.  Greater than 50% of this time was spent in counseling and coordination of care with the patient.    Burnard Leigh, MD 08/24/2017, 2:27 PM

## 2017-08-25 ENCOUNTER — Telehealth (HOSPITAL_COMMUNITY): Payer: Self-pay

## 2017-08-25 NOTE — Telephone Encounter (Signed)
Yes that is fine, she can continue remeron 15 mg qhs and ativan 0.5 mg TID prn

## 2017-08-25 NOTE — Telephone Encounter (Signed)
Patients husband called and said patient took a Xanax last night and it made her sick - patient also realized that she was getting a benefit from the Remeron. Patient would like to go back to her original medications. Please review and advise, thank you

## 2017-10-05 ENCOUNTER — Other Ambulatory Visit (HOSPITAL_COMMUNITY): Payer: Self-pay | Admitting: Psychiatry

## 2017-10-05 ENCOUNTER — Telehealth (HOSPITAL_COMMUNITY): Payer: Self-pay

## 2017-10-05 DIAGNOSIS — F410 Panic disorder [episodic paroxysmal anxiety] without agoraphobia: Secondary | ICD-10-CM

## 2017-10-05 DIAGNOSIS — F411 Generalized anxiety disorder: Secondary | ICD-10-CM

## 2017-10-05 MED ORDER — MIRTAZAPINE 15 MG PO TABS: 15.0000 mg | ORAL_TABLET | Freq: Every day | ORAL | 0 refills | Status: DC

## 2017-10-05 NOTE — Telephone Encounter (Signed)
Dr. Lolly Mustache please see the August 21 message with Dr. Rene Kocher. That medication was never sent to the pharmacy so I need your help. Can you please renew that script also, can this patient get a refill of Xanax?

## 2017-10-05 NOTE — Telephone Encounter (Signed)
Remeron 50 mg called into her local pharmacy.  She already had her Xanax refills.

## 2017-11-08 ENCOUNTER — Other Ambulatory Visit (HOSPITAL_COMMUNITY): Payer: Self-pay

## 2017-11-08 MED ORDER — MIRTAZAPINE 15 MG PO TABS: 15.0000 mg | ORAL_TABLET | Freq: Every day | ORAL | 0 refills | Status: DC

## 2017-11-25 ENCOUNTER — Ambulatory Visit (INDEPENDENT_AMBULATORY_CARE_PROVIDER_SITE_OTHER): Payer: BLUE CROSS/BLUE SHIELD | Admitting: Psychiatry

## 2017-11-25 VITALS — BP 154/76 | HR 80 | Ht <= 58 in | Wt 117.0 lb

## 2017-11-25 DIAGNOSIS — F322 Major depressive disorder, single episode, severe without psychotic features: Secondary | ICD-10-CM

## 2017-11-25 DIAGNOSIS — F411 Generalized anxiety disorder: Secondary | ICD-10-CM

## 2017-11-25 MED ORDER — LORAZEPAM 0.5 MG PO TABS: 0.5000 mg | ORAL_TABLET | Freq: Two times a day (BID) | ORAL | 0 refills | Status: DC

## 2017-11-25 MED ORDER — MIRTAZAPINE 15 MG PO TABS: 15.0000 mg | ORAL_TABLET | Freq: Every day | ORAL | 0 refills | Status: DC

## 2017-11-25 NOTE — Progress Notes (Signed)
BH MD/PA/NP OP Progress Note  11/25/2017 5:17 PM Lauren Bolton  MRN:  [id number]  Chief Complaint:  Chief Complaint    Anxiety; Depression     HPI: Eating with the patient for the first time today.  She was previously treated by Dr. Rene Kocher who is no longer working at the [facility] behavioral outpatient clinic.  She is accompanied today by her husband and interpreter.  Patient was able to understand and respond in English to most of the questions that I asked.  Her husband shared some additional information.  Her interpreter did not answer questions or direct questions to the patient.  Patient reports that she has racing thoughts focused on the past especially at night.  Her sleep is variable.  She does feel depressed.  She is isolating and has low motivation.  She does not like to go out and she does not like to engage in activities.  She will do some chores around the house which is an improvement from before.  She feels tired all the time and her husband reports that patient is barely eating.  She was doing better when she initially started the Remeron.  She is fearful of side effects and does not want any dose changes or additional medication.  She denies SI/HI.   Visit Diagnosis:    ICD-10-CM   1. GAD (generalized anxiety disorder) F41.1 mirtazapine (REMERON) 15 MG tablet    LORazepam (ATIVAN) 0.5 MG tablet  2. Current severe episode of major depressive disorder without psychotic features without prior episode (HCC) F32.2 mirtazapine (REMERON) 15 MG tablet    Past Psychiatric History: Patient has a history of depression and anxiety that seem to come on in August 2017 and could be related to thyroid issues.  She was admitted [place] health inpatient in February 25, 2016 for depression and anxiety and SI.Marland Kitchen  She established care at the [facility] behavioral outpatient clinic in April 2018.  In the past she was treated with Remeron and it was helpful. Pt had some relief with Xanax and Ativan. Pt was  not able to tolerate Zoloft, Effexor.   Past Medical History:  Past Medical History:  Diagnosis Date  . Anxiety   . Depression   . Iron deficiency anemia   . Thyroid disease     Past Surgical History:  Procedure Laterality Date  . ESOPHAGOGASTRODUODENOSCOPY N/A 11/13/2015   Procedure: ESOPHAGOGASTRODUODENOSCOPY (EGD);  Surgeon: Dorena Cookey, MD;  Location: [facility];  Service: Endoscopy;  Laterality: N/A;  . ESOPHAGOGASTRODUODENOSCOPY (EGD) WITH PROPOFOL N/A 12/26/2015   Procedure: ESOPHAGOGASTRODUODENOSCOPY (EGD) WITH PROPOFOL;  Surgeon: Bernette Redbird, MD;  Location: [facility] ENDOSCOPY;  Service: Endoscopy;  Laterality: N/A;  . PLACEMENT OF BREAST IMPLANTS     20 years ago  . thyroid goiter      Family Psychiatric History: Reviewed-no significant family history of reported  Family History:  Family History  Problem Relation Age of Onset  . Hypertension Mother   . Thyroid disease Neg Hx     Social History:  Social History   Socioeconomic History  . Marital status: Married    Spouse name: Not on file  . Number of children: Not on file  . Years of education: Not on file  . Highest education level: Not on file  Occupational History  . Not on file  Social Needs  . Financial resource strain: Not on file  . Food insecurity:    Worry: Not on file    Inability: Not on file  .  Transportation needs:    Medical: Not on file    Non-medical: Not on file  Tobacco Use  . Smoking status: Never Smoker  . Smokeless tobacco: Never Used  Substance and Sexual Activity  . Alcohol use: No  . Drug use: No  . Sexual activity: Not Currently  Lifestyle  . Physical activity:    Days per week: Not on file    Minutes per session: Not on file  . Stress: Not on file  Relationships  . Social connections:    Talks on phone: Not on file    Gets together: Not on file    Attends religious service: Not on file    Active member of club or organization: Not on file    Attends meetings of clubs  or organizations: Not on file    Relationship status: Not on file  Other Topics Concern  . Not on file  Social History Narrative  . Not on file    Allergies:  Allergies  Allergen Reactions  . Benadryl [Diphenhydramine Hcl] Diarrhea and Other (See Comments)    Reaction:  Makes her excessively sleepy   . Alprazolam Diarrhea  . Lactose Intolerance (Gi)     Metabolic Disorder Labs: No results found for: HGBA1C, MPG No results found for: PROLACTIN No results found for: CHOL, TRIG, HDL, CHOLHDL, VLDL, LDLCALC Lab Results  Component Value Date   TSH 0.70 08/19/2017   TSH 0.380 06/14/2017    Therapeutic Level Labs: No results found for: LITHIUM No results found for: VALPROATE No components found for:  CBMZ  Current Medications: Current Outpatient Medications  Medication Sig Dispense Refill  . Carboxymethylcellul-Glycerin (CLEAR EYES FOR DRY EYES OP) Place 1 drop into both eyes daily as needed (dry eyes).    . Cholecalciferol (VITAMIN D PO) Take 1 tablet by mouth daily.    Marland Kitchen docusate sodium (COLACE) 100 MG capsule Take 100 mg by mouth daily.    Marland Kitchen FIBER ADULT GUMMIES PO Take 1 tablet by mouth daily.    Marland Kitchen ibuprofen (ADVIL,MOTRIN) 200 MG tablet Take 200-400 mg by mouth daily as needed for moderate pain.    . iron polysaccharides (NIFEREX) 150 MG capsule Take 1 capsule (150 mg total) by mouth 2 (two) times daily. 60 capsule 0  . levothyroxine (SYNTHROID) 100 MCG tablet Take 1 tablet (100 mcg total) by mouth daily before breakfast. 30 tablet 0  . mirtazapine (REMERON) 15 MG tablet Take 1 tablet (15 mg total) by mouth at bedtime. 30 tablet 0  . PROTEIN PO Take by mouth. Mix one scoop with water and drink    . simethicone (MYLICON) 80 MG chewable tablet Chew 2 tablets (160 mg total) by mouth 4 (four) times daily as needed for flatulence. 30 tablet 0  . LORazepam (ATIVAN) 0.5 MG tablet Take 1 tablet (0.5 mg total) by mouth 2 (two) times daily. 60 tablet 0  . ondansetron (ZOFRAN-ODT) 4 MG  disintegrating tablet Take 1 tablet (4 mg total) by mouth every 8 (eight) hours as needed for nausea or vomiting. (Patient not taking: Reported on 11/25/2017) 8 tablet 0   No current facility-administered medications for this visit.      Musculoskeletal: Strength & Muscle Tone: within normal limits Gait & Station: normal Patient leans: N/A  Psychiatric Specialty Exam: Review of Systems  Musculoskeletal: Negative for back pain, joint pain and neck pain.  Neurological: Negative for dizziness, speech change and headaches.    Blood pressure (!) 154/76, pulse 80, height 4\' 10"  (1.473  m), weight 117 lb (53.1 kg), SpO2 100 %.Body mass index is 24.45 kg/m.  General Appearance: Casual  Eye Contact:  Fair  Speech:  Slow  Volume:  Normal  Mood:  Anxious and Depressed  Affect:  Congruent and Tearful  Thought Process:  Goal Directed and Descriptions of Associations: Intact  Orientation:  Full (Time, Place, and Person)  Thought Content: Logical   Suicidal Thoughts:  No  Homicidal Thoughts:  No  Memory:  Immediate;   Fair  Judgement:  Fair  Insight:  Present  Psychomotor Activity:  Normal  Concentration:  Concentration: Fair  Recall:  [employer]  Fund of Knowledge: Fair  Language: Fair  Akathisia:  No  Handed:  Right  AIMS (if indicated): not done  Assets:  Desire for Improvement Housing Intimacy Social Support  ADL's:  Intact  Cognition: WNL  Sleep:  Poor   Screenings: AIMS     [facility]  AIMS Total Score  0    AUDIT     [facility]  Alcohol Use Disorder Identification Test Final Score (AUDIT)  0       Assessment and Plan: GAD; panic attacks; MDD- single episode, severe without psychotic features; rule out PTSD I reviewed her psychiatric notes and history in epic and confirmed with the patient and her husband   Medication  management with supportive therapy. Risks and benefits, side effects and alternative treatment options discussed with patient. Pt was given an opportunity to ask questions about medication, illness, and treatment. All current psychiatric medications have been reviewed and discussed with the patient and adjusted as clinically appropriate. The patient has been provided an accurate and updated list of the medications being now prescribed. Pt verbalized understanding and verbal consent obtained for treatment.  The risk of un-intended pregnancy is low based on the fact that pt reports postmenopausal. Pt is aware that these meds carry a teratogenic risk. Pt will discuss plan of action if she does or plans to become pregnant in the future.  Status of current problems: Unchanged anxiety, depression  Meds: d/c Xanax-patient states she never took it Remeron 15mg  po qhs for anxiety and depression.  Patient would likely benefit from a dose increase but she has refused Restart Ativan 0.5 mg p.o. twice daily as needed anxiety Patient does not want her medication changed as she has had significant side effects to other medications in the past and is fearful of making any sort of change  Labs: Reviewed labs done 06/14/2017-BMP within normal limits; CBC no signs of infection or anemia.  08/19/2017 her TSH was 0.7  Therapy: brief supportive therapy provided. Discussed psychosocial stressors in detail.     Consultations: Encouraged to follow up with PCP as needed   Pt denies SI and is at an acute low risk for suicide. Patient told to call clinic if any problems occur. Patient advised to go to ER if they should develop SI/HI, side effects, or if symptoms worsen. Pt has crisis numbers to call if needed. Pt acknowledged and agreed with plan and verbalized understanding.  F/up in 2 months or sooner if needed  The duration of this appointment visit was 45 minutes of face-to-face time with the patient.  Greater than 50%  of this time was spent in counseling, explanation of  diagnosis, planning of further management, and coordination of care      Oletta DarterSalina Makenlee Mckeag, MD 11/25/2017, 5:17  PM

## 2017-12-07 DIAGNOSIS — Z6824 Body mass index (BMI) 24.0-24.9, adult: Secondary | ICD-10-CM | POA: Diagnosis not present

## 2017-12-07 DIAGNOSIS — J069 Acute upper respiratory infection, unspecified: Secondary | ICD-10-CM | POA: Diagnosis not present

## 2018-02-03 ENCOUNTER — Ambulatory Visit (INDEPENDENT_AMBULATORY_CARE_PROVIDER_SITE_OTHER): Payer: BLUE CROSS/BLUE SHIELD | Admitting: Psychiatry

## 2018-02-03 ENCOUNTER — Encounter (HOSPITAL_COMMUNITY): Payer: Self-pay | Admitting: Psychiatry

## 2018-02-03 VITALS — BP 147/86 | HR 76 | Ht 60.0 in | Wt 116.4 lb

## 2018-02-03 DIAGNOSIS — F411 Generalized anxiety disorder: Secondary | ICD-10-CM | POA: Diagnosis not present

## 2018-02-03 DIAGNOSIS — F322 Major depressive disorder, single episode, severe without psychotic features: Secondary | ICD-10-CM | POA: Diagnosis not present

## 2018-02-03 MED ORDER — LORAZEPAM 0.5 MG PO TABS: 0.5000 mg | ORAL_TABLET | Freq: Two times a day (BID) | ORAL | 2 refills | Status: DC

## 2018-02-03 MED ORDER — MIRTAZAPINE 15 MG PO TABS: 15.0000 mg | ORAL_TABLET | Freq: Every day | ORAL | 2 refills | Status: DC

## 2018-02-03 NOTE — Progress Notes (Signed)
BH MD/PA/NP OP Progress Note  02/03/2018 3:39 PM Lauren Bolton  MRN:  [id number]  Chief Complaint:  Chief Complaint    Depression     HPI: Patient is here with her husband.  Patient tells me that she is starting to feel better.  Over the course of late December till now she has been more social and has stopped isolating herself.  She went on a 2-day trip with a friend to [place] and really enjoyed herself.  She has been making an effort to talk with her sister and her friend and will go out with them.  She has been driving to the grocery store and back but wishes that she can drive for [place] distances.  She is sleeping better.  Her anxiety has decreased and she is not thinking about the past as much.  She does take 1 Ativan during the day and then another one at bedtime.  She takes the Remeron at bedtime and states that it takes about an hour for it to be effective.  Her sleep is good overall.  She is eating more and feels that her appetite has significantly improved.  She has been a little bit more involved with housework.  At times when she is around others who are talking about things that make her depressed she has learned to get up and walk away.  She tells me that it feels nice to have people to talk to such as her sister and her friend.  She has made a big difference.  She is denying SI/HI.  She does not want to be on medications for her entire life and is asking when we can stop the medication.  I explained that as [place] as she is active depression symptoms it would not be a good idea to stop the antidepressant as it could lead to decompensation.  She verbalized her understanding.  Visit Diagnosis:    ICD-10-CM   1. Current severe episode of major depressive disorder without psychotic features without prior episode (HCC) F32.2 mirtazapine (REMERON) 15 MG tablet  2. GAD (generalized anxiety disorder) F41.1 LORazepam (ATIVAN) 0.5 MG tablet    mirtazapine (REMERON) 15 MG tablet        Past  Psychiatric History: Patient has a history of depression and anxiety that seem to come on in August 2017 and could be related to thyroid issues.  She was admitted  health inpatient in February 25, 2016 for depression and anxiety and SI.Marland Kitchen.  [facility] April 2018.  In the past she was treated with Remeron and it was helpful. Pt had some relief with Xanax and Ativan. Pt was not able to tolerate Zoloft, Effexor.   Past Medical History:  Past Medical History:  Diagnosis Date  . Anxiety   . Depression   . Iron deficiency anemia   . Thyroid disease     Past Surgical History:  Procedure Laterality Date  . ESOPHAGOGASTRODUODENOSCOPY N/A 11/13/2015   Procedure: ESOPHAGOGASTRODUODENOSCOPY (EGD);  Surgeon: Dorena CookeyJohn Hayes, MD;  [facility];  Service: Endoscopy;  Laterality: N/A;  . ESOPHAGOGASTRODUODENOSCOPY (EGD) WITH PROPOFOL N/A 12/26/2015   Procedure: ESOPHAGOGASTRODUODENOSCOPY (EGD) WITH PROPOFOL;  Surgeon: Bernette Redbirdobert Buccini, MD;  Location: [facility];  Service: Endoscopy;  Laterality: N/A;  . PLACEMENT OF BREAST IMPLANTS     20 years ago  . thyroid goiter      Family Psychiatric History: Reviewed-no significant family history of reported  Family History:  Family History  Problem Relation Age of Onset  . Hypertension Mother   . Thyroid disease Neg Hx     Social History:  Social History   Socioeconomic History  . Marital status: Married    Spouse name: Not on file  . Number of children: Not on file  . Years of education: Not on file  . Highest education level: Not on file  Occupational History  . Not on file  Social Needs  . Financial resource strain: Not on file  . Food insecurity:    Worry: Not on file    Inability: Not on file  . Transportation needs:    Medical: Not on file    Non-medical: Not on file  Tobacco Use  . Smoking status: Never Smoker  . Smokeless tobacco: Never Used  Substance and  Sexual Activity  . Alcohol use: No  . Drug use: No  . Sexual activity: Not Currently  Lifestyle  . Physical activity:    Days per week: Not on file    Minutes per session: Not on file  . Stress: Not on file  Relationships  . Social connections:    Talks on phone: Not on file    Gets together: Not on file    Attends religious service: Not on file    Active member of club or organization: Not on file    Attends meetings of clubs or organizations: Not on file    Relationship status: Not on file  Other Topics Concern  . Not on file  Social History Narrative  . Not on file    Allergies:  Allergies  Allergen Reactions  . Benadryl [Diphenhydramine Hcl] Diarrhea and Other (See Comments)    Reaction:  Makes her excessively sleepy   . Alprazolam Diarrhea  . Lactose Intolerance (Gi)     Metabolic Disorder Labs: No results found for: HGBA1C, MPG No results found for: PROLACTIN No results found for: CHOL, TRIG, HDL, CHOLHDL, VLDL, LDLCALC Lab Results  Component Value Date   TSH 0.70 08/19/2017   TSH 0.380 06/14/2017    Therapeutic Level Labs: No results found for: LITHIUM No results found for: VALPROATE No components found for:  CBMZ  Current Medications: Current Outpatient Medications  Medication Sig Dispense Refill  . Carboxymethylcellul-Glycerin (CLEAR EYES FOR DRY EYES OP) Place 1 drop into both eyes daily as needed (dry eyes).    . Cholecalciferol (VITAMIN D PO) Take 1 tablet by mouth daily.    Marland Kitchen docusate sodium (COLACE) 100 MG capsule Take 100 mg by mouth daily.    Marland Kitchen FIBER ADULT GUMMIES PO Take 1 tablet by mouth daily.    Marland Kitchen ibuprofen (ADVIL,MOTRIN) 200 MG tablet Take 200-400 mg by mouth daily as needed for moderate pain.    . iron polysaccharides (NIFEREX) 150 MG capsule Take 1 capsule (150 mg total) by mouth 2 (two) times daily. 60 capsule 0  . levothyroxine (SYNTHROID) 100 MCG tablet Take 1 tablet (100 mcg total) by mouth daily before breakfast. 30 tablet 0  .  LORazepam (ATIVAN) 0.5 MG tablet Take 1 tablet (0.5 mg total) by mouth 2 (two) times daily. 60 tablet 2  . mirtazapine (REMERON) 15 MG tablet Take 1 tablet (15 mg total) by mouth at bedtime. 30 tablet 2  . ondansetron (ZOFRAN-ODT) 4 MG disintegrating tablet Take 1 tablet (4 mg total) by mouth every 8 (eight) hours as needed for nausea or vomiting. 8 tablet 0  . PROTEIN PO Take by mouth. Mix one scoop with water and  drink    . simethicone (MYLICON) 80 MG chewable tablet Chew 2 tablets (160 mg total) by mouth 4 (four) times daily as needed for flatulence. 30 tablet 0   No current facility-administered medications for this visit.      Musculoskeletal: Strength & Muscle Tone: within normal limits Gait & Station: normal Patient leans: N/A  Psychiatric Specialty Exam: ROS  Blood pressure (!) 147/86, pulse 76, height 5' (1.524 m), weight 116 lb 6.4 oz (52.8 kg).Body mass index is 22.73 kg/m.  General Appearance: Well Groomed and with good hygeine. calmer and pleasent  Eye Contact:  Good  Speech:  Clear and Coherent and Normal Rate  Volume:  Normal  Mood:  Anxious and Depressed  Affect:  Congruent and brighter and calmer. more engaged than previous visit  Thought Process:  Goal Directed and Descriptions of Associations: Intact  Orientation:  Full (Time, Place, and Person)  Thought Content:  Logical  Suicidal Thoughts:  No  Homicidal Thoughts:  No  Memory:  Immediate;   Good  Judgement:  Fair  Insight:  Fair  Psychomotor Activity:  Normal  Concentration:  Concentration: Good  Recall:  Good  Fund of Knowledge:  Good  Language:  Good  Akathisia:  No  Handed:  Right  AIMS (if indicated):     Assets:  Communication Skills Desire for Improvement Financial Resources/Insurance Housing Intimacy Social Support Talents/Skills Transportation  ADL's:  Intact  Cognition:  WNL  Sleep:   good     Screenings: AIMS     [facility]  AIMS Total Score  0    AUDIT     [facility]  Alcohol Use Disorder Identification Test Final Score (AUDIT)  0      I reviewed the information below on 02/03/2018 and have updated it Assessment and Plan: GAD; panic attacks; MDD- single episode, severe without psychotic features; rule out PTSD   Medication management with supportive therapy. Risks and benefits, side effects and alternative treatment options discussed with patient. Pt was given an opportunity to ask questions about medication, illness, and treatment. All current psychiatric medications have been reviewed and discussed with the patient and adjusted as clinically appropriate. The patient has been provided an accurate and updated list of the medications being now prescribed. Pt verbalized understanding and verbal consent obtained for treatment.  The risk of un-intended pregnancy is low based on the fact that pt reports postmenopausal. Pt is aware that these meds carry a teratogenic risk. Pt will discuss plan of action if she does or plans to become pregnant in the future.  Status of current problems: Anxiety and depression are slowly improving   Meds:Remeron 15mg  po qhs for anxiety and depression.  Patient would likely benefit from a dose increase but she has refused Restart Ativan 0.5 mg p.o. twice daily as needed anxiety Patient does not want her medication changed as she has had significant side effects to other medications in the past and is fearful of making any sort of change  Labs: none today  Therapy: brief supportive therapy provided. Discussed psychosocial stressors in detail.     Consultations: Encouraged to follow up with PCP as needed   Pt denies SI and is at an acute low risk for suicide. Patient told to call clinic if any problems occur. Patient advised to go to ER if they should develop SI/HI, side effects, or if  symptoms worsen. Pt has crisis numbers to call if needed. Pt acknowledged and agreed with plan and verbalized understanding.  F/up in 2 months or sooner if needed  The duration of this appointment visit was 20 minutes of face-to-face time with the patient.  Greater than 50% of this time was spent in counseling, explanation of  diagnosis, planning of further management, and coordination of care      Oletta Darter, MD 02/03/2018, 3:39 PM

## 2018-02-15 IMAGING — CT CT CTA ABD/PEL W/CM AND/OR W/O CM
2 of 8 series · 15 of 46 positions shown, 17 images · IV contrast (OMNI)
Comparison: 11/11/2015

CLINICAL DATA: Epigastric pain and diffuse abdominal tenderness
with nausea and vomiting for the last 2 months. Pain has become
severe today.

EXAM:
CTA ABDOMEN AND PELVIS WITH CONTRAST
TECHNIQUE: Multidetector CT imaging of the abdomen and pelvis was performed
using the standard protocol during bolus administration of
intravenous contrast. Multiplanar reconstructed images and MIPs were
obtained and reviewed to evaluate the vascular anatomy.
CONTRAST:  80 mL Isovue 370 IV

[Series 4: dissection 3.0 i30f 3 · axial · 0.62mm/px · z∈[-979,-631]mm · 12 of 128 slices shown, 14 images]
[im 6/128  soft-tissue]
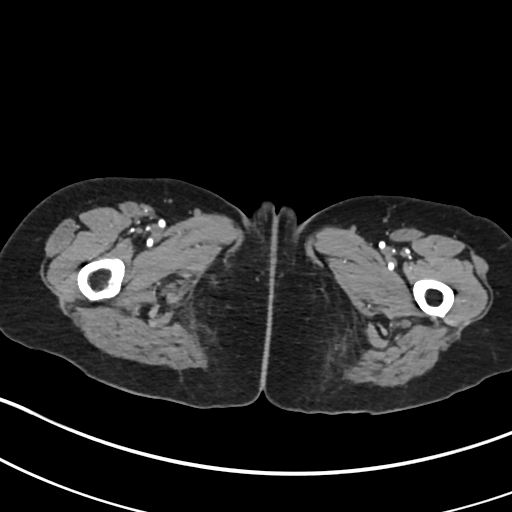
[im 6/128  bone]
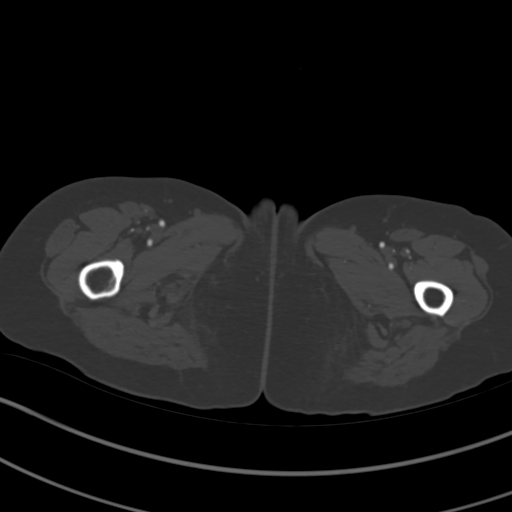
[im 16/128  soft-tissue]
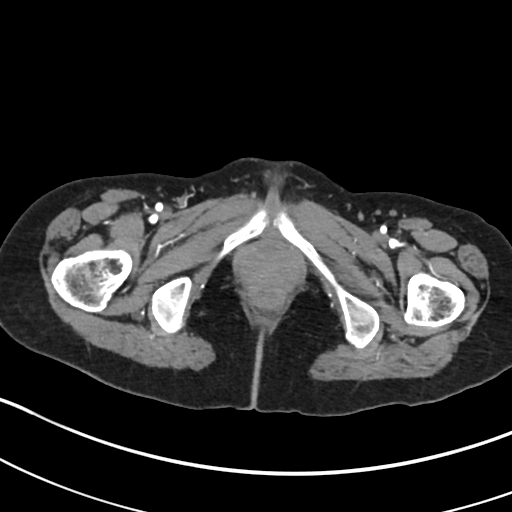
[im 27/128  soft-tissue]
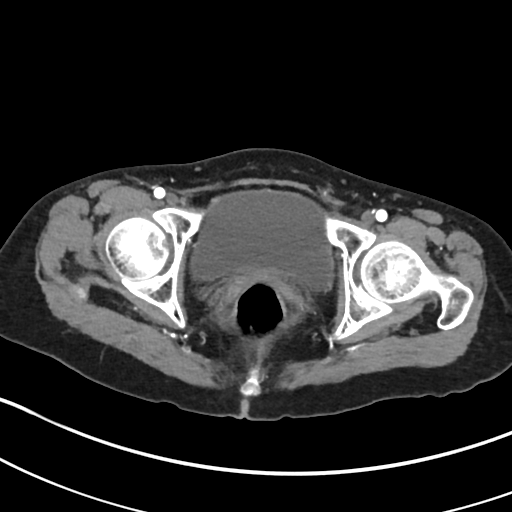
[im 38/128  soft-tissue]
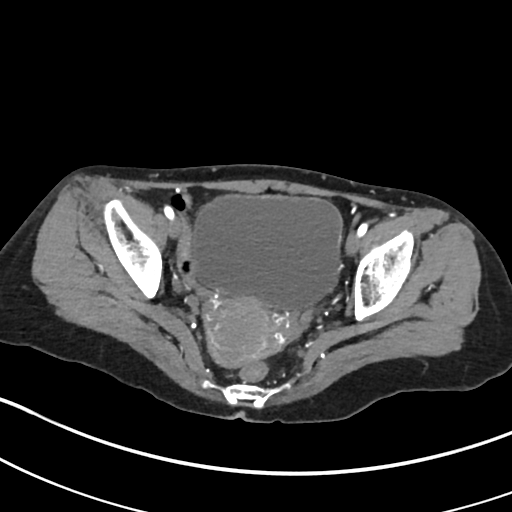
[im 48/128  soft-tissue]
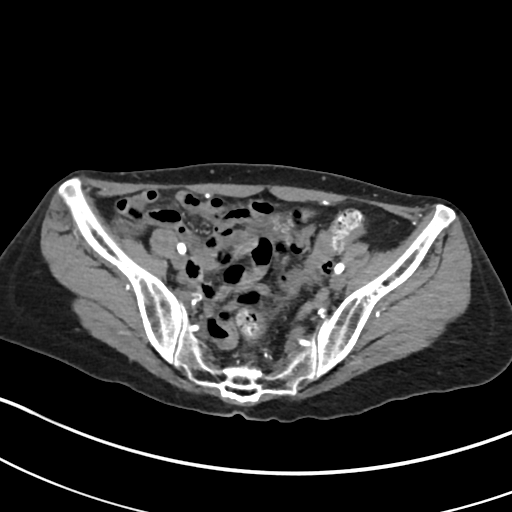
[im 59/128  soft-tissue]
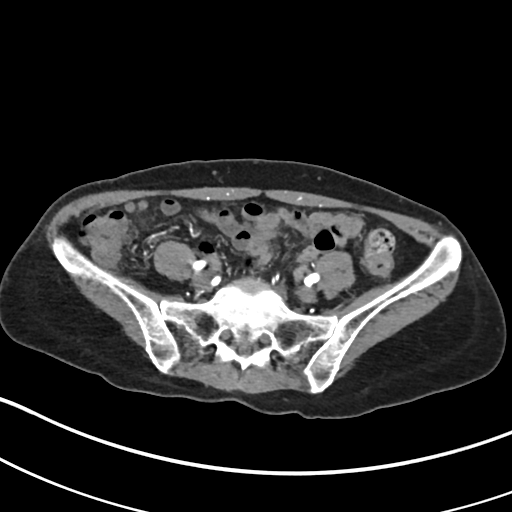
[im 69/128  soft-tissue]
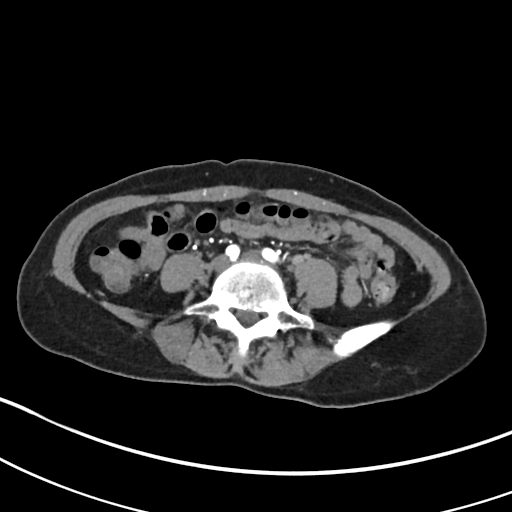
[im 80/128  soft-tissue]
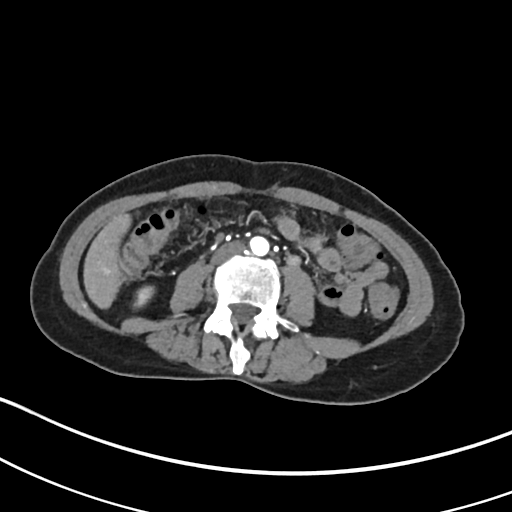
[im 90/128  soft-tissue]
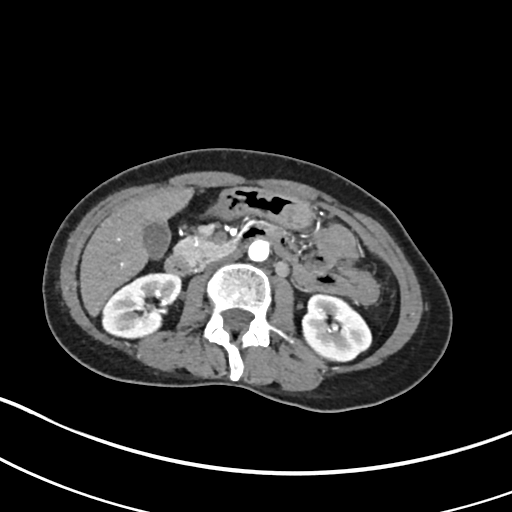
[im 90/128  bone]
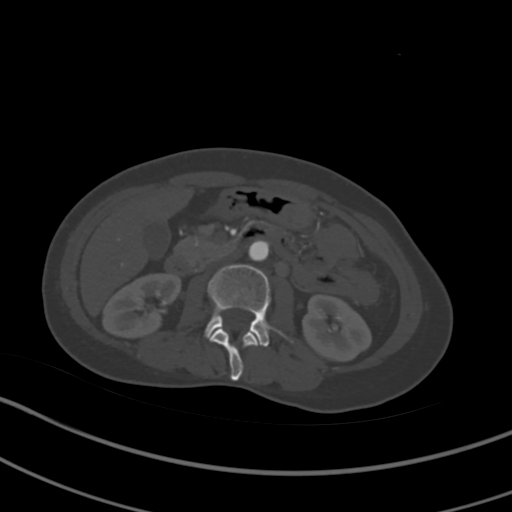
[im 101/128  soft-tissue]
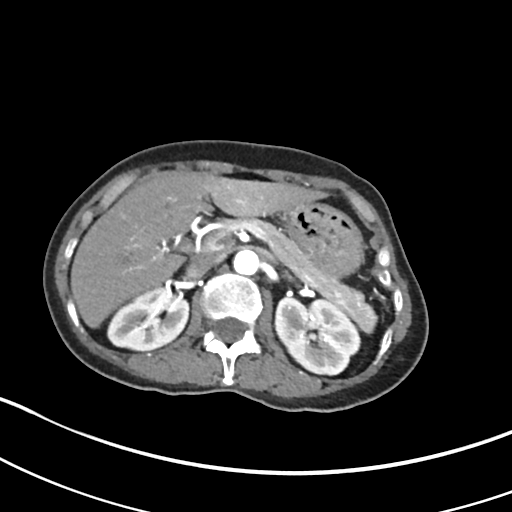
[im 112/128  soft-tissue]
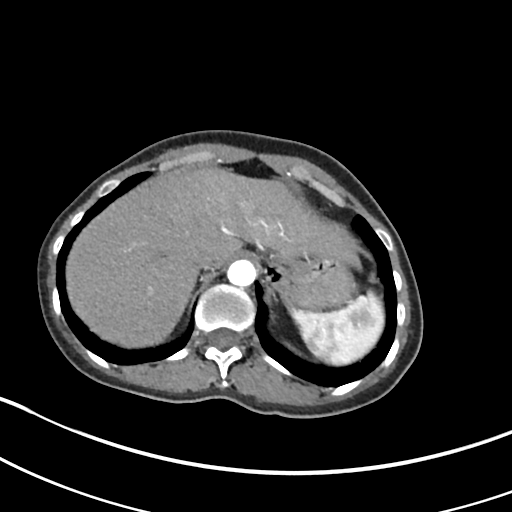
[im 122/128  soft-tissue]
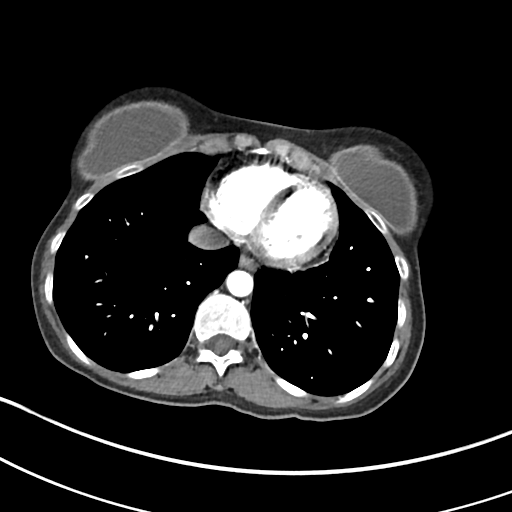

[Series 7: coronals · coronal · 0.56mm/px · 3 of 85 slices shown]
[im 22/85  soft-tissue]
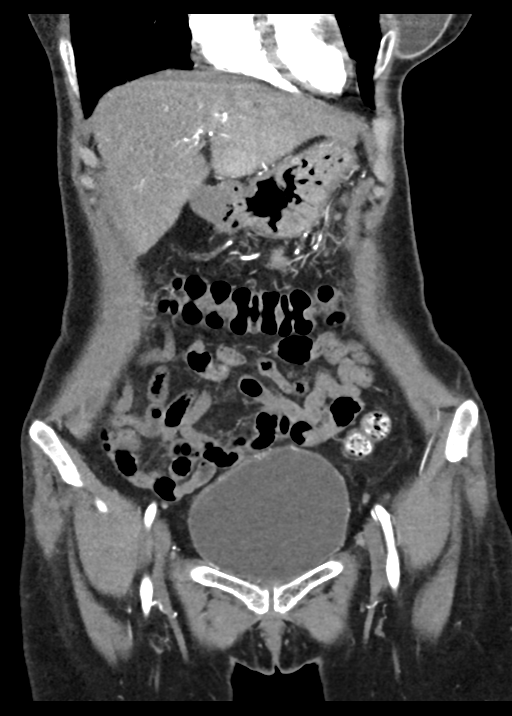
[im 43/85  soft-tissue]
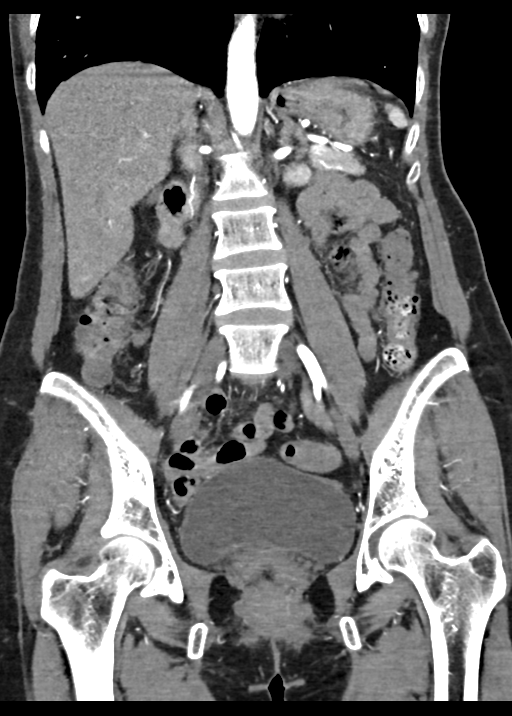
[im 64/85  soft-tissue]
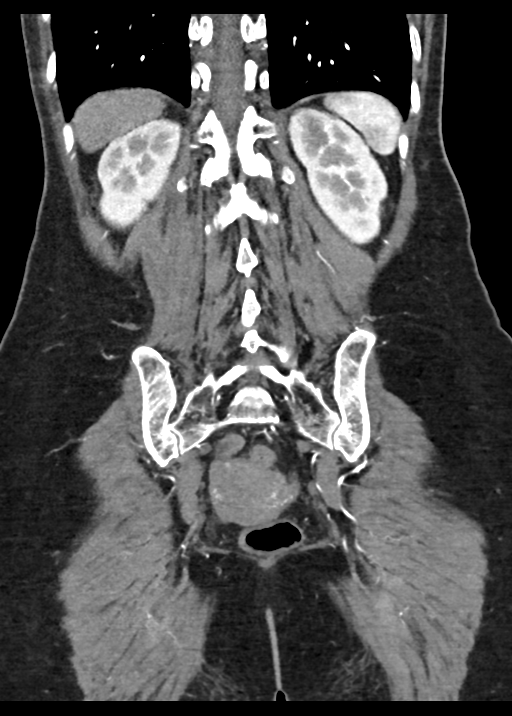

[15 of 46 positions shown; findings below may reference images not displayed]

FINDINGS: VASCULAR

Aorta: Minimal atheromatous plaque. No aneurysm, dissection, or
stenosis.

Celiac: Patent

SMA: Patent

Renals: Both renal arteries are patent without evidence of aneurysm,
dissection, vasculitis, fibromuscular dysplasia or significant
stenosis.

IMA: Patent without evidence of aneurysm, dissection, vasculitis or
significant stenosis.

Inflow: Patent without evidence of aneurysm, dissection, vasculitis
or significant stenosis.

Proximal Outflow: Bilateral common femoral and visualized portions
of the superficial and profunda femoral arteries are patent without
evidence of aneurysm, dissection, vasculitis or significant
stenosis.

Veins: Early reflux down a dilated left gonadal vein.

Review of the MIP images confirms the above findings.

NON-VASCULAR

Lower chest: Lung bases clear. No pleural or pericardial effusion.
Bilateral breast implants partially visualized.

Hepatobiliary: No focal liver abnormality is seen. No gallstones,
gallbladder wall thickening, or biliary dilatation.

Pancreas: Unremarkable. No pancreatic ductal dilatation or
surrounding inflammatory changes.

Spleen: Normal in size without focal abnormality.

Adrenals/Urinary Tract: Adrenal glands are unremarkable. Kidneys are
normal, without renal calculi, focal lesion, or hydronephrosis.
Bladder is physiologically distended.

Stomach/Bowel: Stomach is within normal limits. Appendix appears
normal. No evidence of bowel wall thickening, distention, or
inflammatory changes.

Lymphatic: No adenopathy localized.

Reproductive: Dilated refluxing left gonadal vein. Uterus and
bilateral adnexal regions unremarkable.

Other: No ascites.  No free air.

Musculoskeletal: No acute or significant osseous findings.
IMPRESSION: VASCULAR

1. No acute vascular findings.
2.  Aortic Atherosclerosis ([id number])
3. Refluxing dilated left gonadal vein. Correlate with any clinical
evidence of pelvic congestion syndrome.

NON-VASCULAR

1. No acute findings

## 2018-02-15 IMAGING — CR DG ABDOMEN ACUTE W/ 1V CHEST
3 series · 3 of 3 positions shown · non-contrast
Comparison: 06/20/2015.

CLINICAL DATA: Nausea vomiting.

EXAM:
DG ABDOMEN ACUTE W/ 1V CHEST

[chest pa]
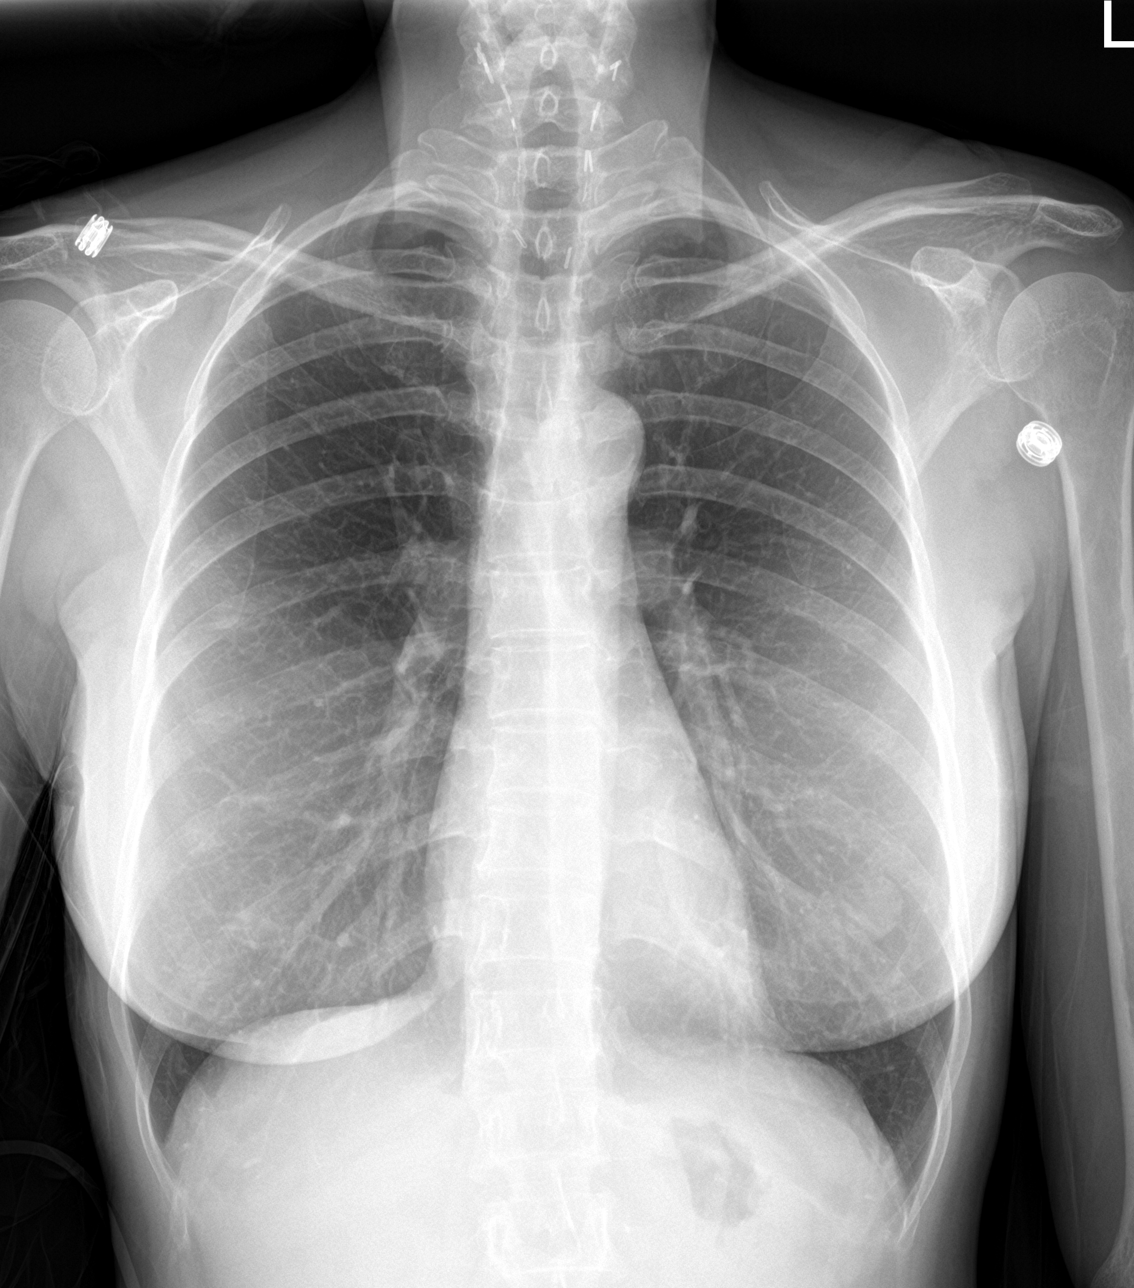

[abdomen erect]
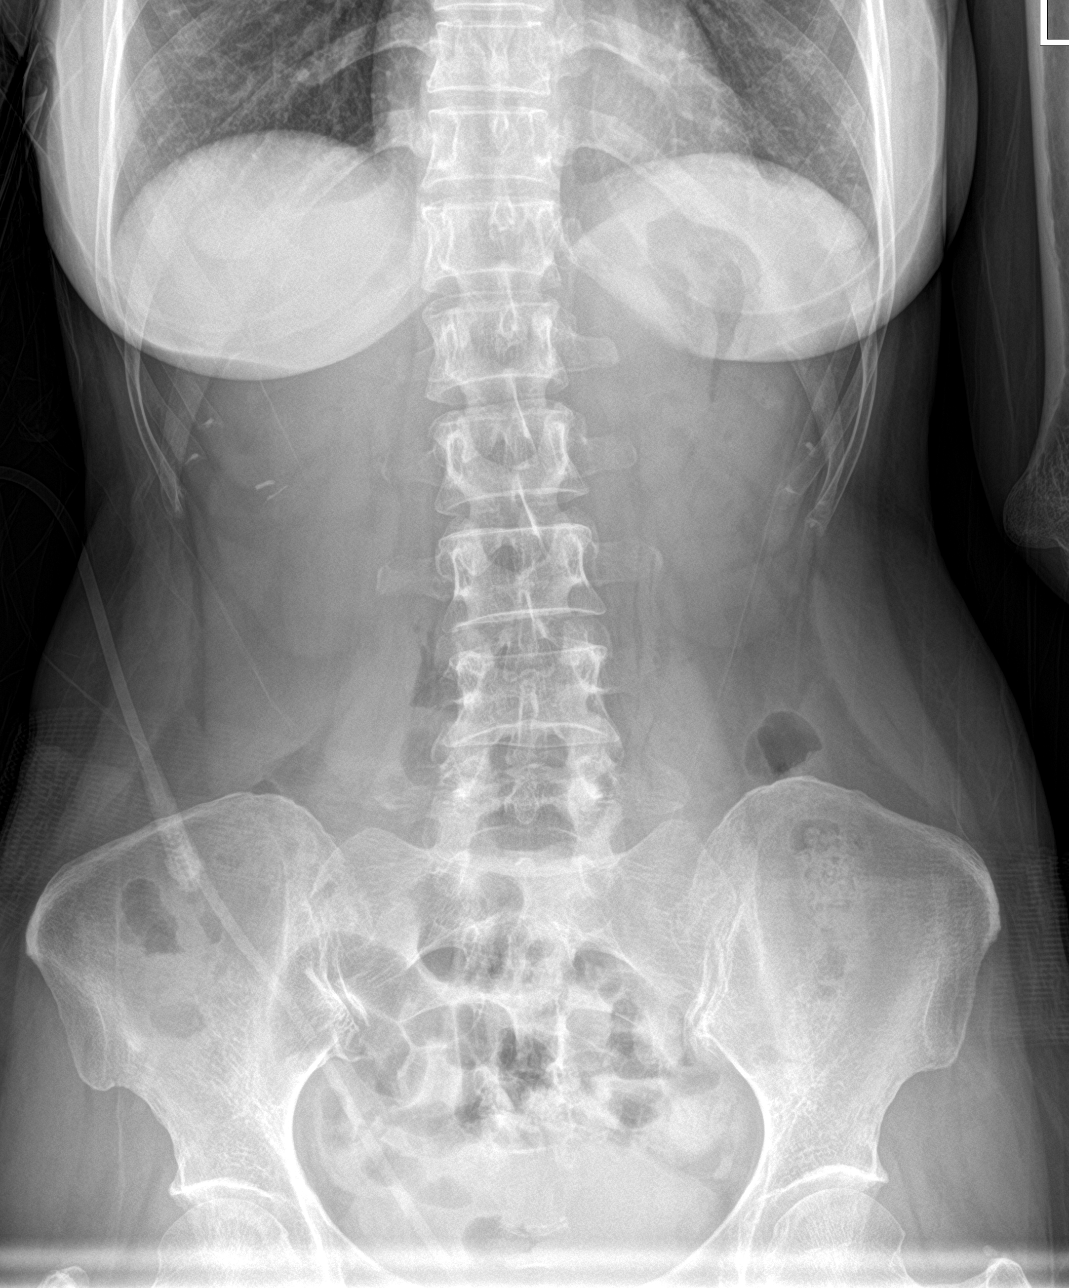

[abdomen supine]
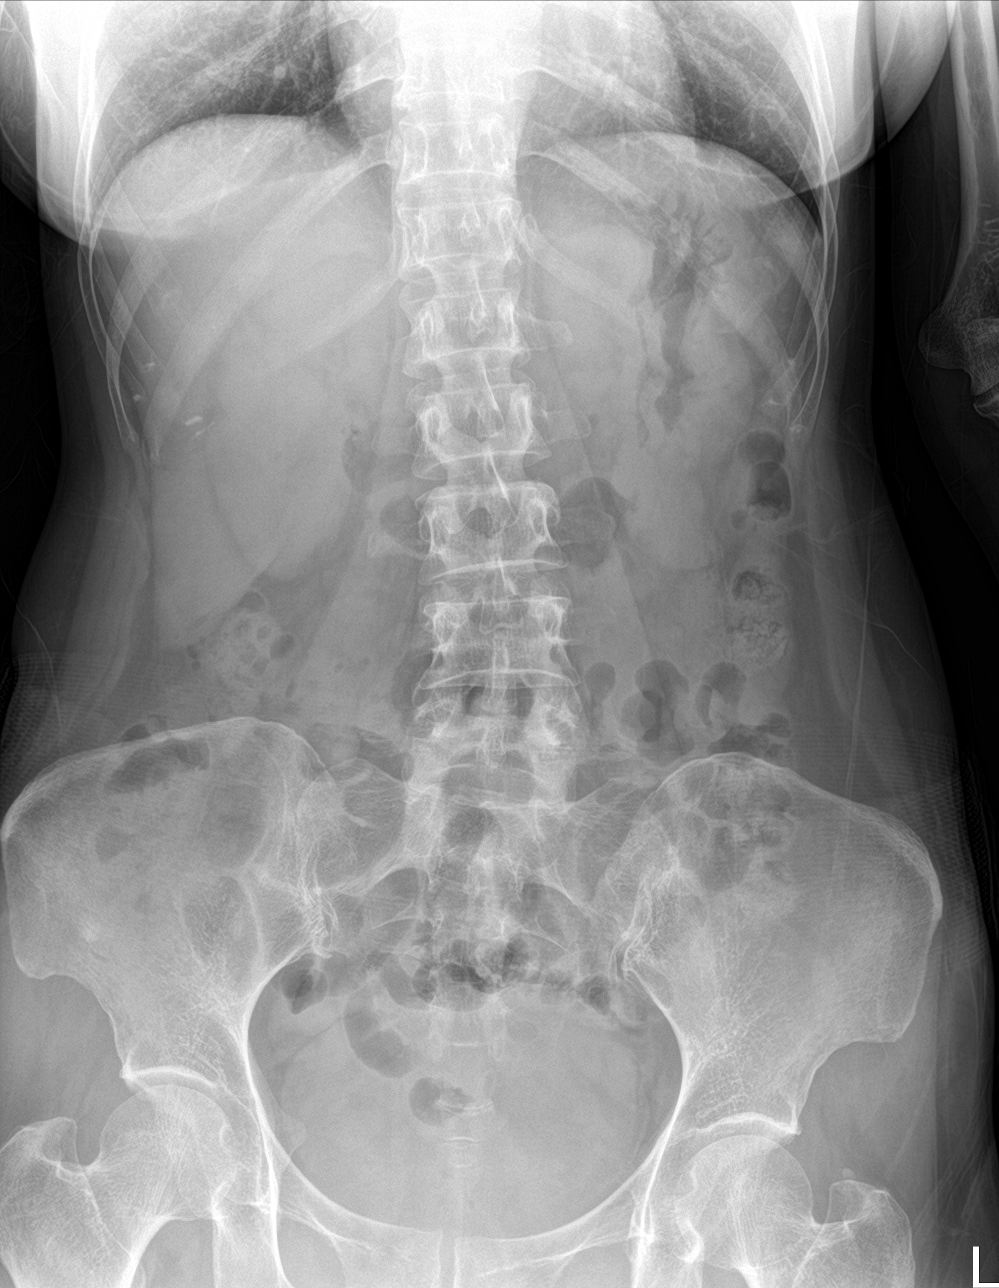

[3 of 3 positions shown; findings below may reference images not displayed]

FINDINGS: Surgical clips in the neck. Mediastinum and hilar structures normal.
Lungs are clear of acute infiltrates. Knee bilateral nodular
opacities noted over both lung bases consistent prominent nipple
shadows. Soft tissue structures of the abdomen unremarkable. No
bowel distention. No free air. Tiny sclerotic focus in the right
iliac wing is unchanged most consistent 30 small bone island. No
acute bony abnormality .
IMPRESSION: Negative abdominal radiographs.  No acute cardiopulmonary disease.

## 2018-03-31 ENCOUNTER — Other Ambulatory Visit: Payer: Self-pay

## 2018-03-31 ENCOUNTER — Ambulatory Visit (INDEPENDENT_AMBULATORY_CARE_PROVIDER_SITE_OTHER): Payer: BLUE CROSS/BLUE SHIELD | Admitting: Psychiatry

## 2018-03-31 DIAGNOSIS — F322 Major depressive disorder, single episode, severe without psychotic features: Secondary | ICD-10-CM

## 2018-03-31 DIAGNOSIS — F411 Generalized anxiety disorder: Secondary | ICD-10-CM

## 2018-03-31 MED ORDER — MIRTAZAPINE 15 MG PO TABS: 15.0000 mg | ORAL_TABLET | Freq: Every day | ORAL | 2 refills | Status: DC

## 2018-03-31 MED ORDER — LORAZEPAM 0.5 MG PO TABS: 0.5000 mg | ORAL_TABLET | Freq: Two times a day (BID) | ORAL | 2 refills | Status: DC

## 2018-03-31 NOTE — Progress Notes (Signed)
Virtual Visit via Telephone Note  I connected with Lauren Bolton on 03/31/18 at  3:00 PM EDT by telephone and verified that I am speaking with the correct person using two identifiers.   I discussed the limitations, risks, security and privacy concerns of performing an evaluation and management service by telephone and the availability of in person appointments. I also discussed with the patient that there may be a patient responsible charge related to this service. The patient expressed understanding and agreed to proceed.   History of Present Illness: I spoke with the patient on the phone.  She tells me that she is "okay but it is not perfect yet".  When I asked her to elaborate she tells me that her depression is significantly improved from several months ago but she still feels some of it.  It shows up with some negative self talk and lack of motivation.  She does experience a lot more anxiety now that the coronavirus pandemic is going on.  She states that she is thinking about it all the time and cannot tolerate when other people talk about negative things.  She is restless and has difficulty focusing on a specific task.  She treats it with Ativan and it takes about 1 hour for her to calm down.  Currently she is denying any SI/HI/AVH.  She is sleeping well and states that the Remeron makes her feel tired during the day so she does not want to have a dose change.   Observations/Objective: Patient is pleasant and cooperative on the phone today.  Her speech was coherent but with a halting rate and normal volume.  Her mood is anxious and affect is congruent.  Thought processes are goal oriented and intact.  Thought content is logical.  She denies SI/HI and does not appear to be responding to internal stimuli.  Memory and concentration are fair.  Fund of knowledge is good language is fair.  I am unable to comment on general appearance, eye contact or psychomotor activity is a am not able to physically see  the patient.  Assessment and Plan: GAD; Panic attacks; MDD-single episode, severe without psychotic features; r/o PTSD  Increase in anxiety. Depression is ongoing. Sleep is good  Ativan 0.5mg  po BID prn anxiety Remeron 15mg  po qHS for mood, anxiety and sleep Patient does not want medication dose changed at this time  Follow Up Instructions:  Follow-up in 2 months on June 09, 2018 at 3 PM    I discussed the assessment and treatment plan with the patient. The patient was provided an opportunity to ask questions and all were answered. The patient agreed with the plan and demonstrated an understanding of the instructions.   The patient was advised to call back or seek an in-person evaluation if the symptoms worsen or if the condition fails to improve as anticipated.  I provided 15 minutes of non-face-to-face time during this encounter.   Oletta Darter, MD

## 2018-06-09 ENCOUNTER — Ambulatory Visit (INDEPENDENT_AMBULATORY_CARE_PROVIDER_SITE_OTHER): Payer: BLUE CROSS/BLUE SHIELD | Admitting: Psychiatry

## 2018-06-09 ENCOUNTER — Other Ambulatory Visit: Payer: Self-pay

## 2018-06-09 ENCOUNTER — Encounter (HOSPITAL_COMMUNITY): Payer: Self-pay | Admitting: Psychiatry

## 2018-06-09 DIAGNOSIS — F322 Major depressive disorder, single episode, severe without psychotic features: Secondary | ICD-10-CM

## 2018-06-09 DIAGNOSIS — F411 Generalized anxiety disorder: Secondary | ICD-10-CM | POA: Diagnosis not present

## 2018-06-09 MED ORDER — MIRTAZAPINE 15 MG PO TABS: 15.0000 mg | ORAL_TABLET | Freq: Every day | ORAL | 2 refills | Status: DC

## 2018-06-09 MED ORDER — LORAZEPAM 0.5 MG PO TABS: 0.5000 mg | ORAL_TABLET | Freq: Two times a day (BID) | ORAL | 2 refills | Status: DC

## 2018-06-09 NOTE — Progress Notes (Signed)
Virtual Visit via Telephone Note  I connected with Lauren Bolton on 06/09/18 at  3:00 PM EDT by telephone and verified that I am speaking with the correct person using two identifiers.  Location: Patient: Home Provider: Office   I discussed the limitations, risks, security and privacy concerns of performing an evaluation and management service by telephone and the availability of in person appointments. I also discussed with the patient that there may be a patient responsible charge related to this service. The patient expressed understanding and agreed to proceed.   History of Present Illness: "Its ok. Nothing changed". Her depression and anxiety are unchanged. She has random periods of time each day where she will get very anxious about everything and then takes Ativan to calm down. Her sleep is fair and if not then she takes Ativan. Her depression has worsened because of the social isolation. Pt denies SI/HI.    Observations/Objective: Patient was calm and engaged in the conversation.  Her speech was coherent but with a halting rate and normal volume.  Her accent did make it difficult to understand her at times.  Her mood remains anxious and depressed.  Her affect is congruent.  Her thought processes were linear, goal directed and intact.  Thought content is logical.  She denies SI/HI.  She denies AVH and did not appear to be responding to internal stimuli.  Memory and concentration were fair.  Fund of knowledge and use of language were fair.  Insight is fair and judgment is good.  I am unable to comment on general appearance, eye contact or psychomotor activity as I am unable to physically see the patient.  Assessment and Plan: GAD; Panic attacks; MDD-single episode; moderate  Status of current symptoms-ongoing depression and anxiety  Remeron 15mg  po qHS Ativan 0.5mg  po BID prn anxiety Patient does not want her medications changed  Follow Up Instructions: 2 mo or sooner if needed   I  discussed the assessment and treatment plan with the patient. The patient was provided an opportunity to ask questions and all were answered. The patient agreed with the plan and demonstrated an understanding of the instructions.   The patient was advised to call back or seek an in-person evaluation if the symptoms worsen or if the condition fails to improve as anticipated.  I provided 15 minutes of non-face-to-face time during this encounter.   Oletta Darter, MD

## 2018-07-04 DIAGNOSIS — R5383 Other fatigue: Secondary | ICD-10-CM | POA: Diagnosis not present

## 2018-07-04 DIAGNOSIS — E890 Postprocedural hypothyroidism: Secondary | ICD-10-CM | POA: Diagnosis not present

## 2018-08-02 DIAGNOSIS — F4323 Adjustment disorder with mixed anxiety and depressed mood: Secondary | ICD-10-CM | POA: Diagnosis not present

## 2018-08-02 DIAGNOSIS — M810 Age-related osteoporosis without current pathological fracture: Secondary | ICD-10-CM | POA: Diagnosis not present

## 2018-08-02 DIAGNOSIS — R740 Nonspecific elevation of levels of transaminase and lactic acid dehydrogenase [LDH]: Secondary | ICD-10-CM | POA: Diagnosis not present

## 2018-08-02 DIAGNOSIS — E890 Postprocedural hypothyroidism: Secondary | ICD-10-CM | POA: Diagnosis not present

## 2018-08-29 DIAGNOSIS — R740 Nonspecific elevation of levels of transaminase and lactic acid dehydrogenase [LDH]: Secondary | ICD-10-CM | POA: Diagnosis not present

## 2018-08-29 DIAGNOSIS — E7849 Other hyperlipidemia: Secondary | ICD-10-CM | POA: Diagnosis not present

## 2018-08-29 DIAGNOSIS — E890 Postprocedural hypothyroidism: Secondary | ICD-10-CM | POA: Diagnosis not present

## 2018-09-01 ENCOUNTER — Encounter (HOSPITAL_COMMUNITY): Payer: Self-pay | Admitting: Psychiatry

## 2018-09-01 ENCOUNTER — Ambulatory Visit (INDEPENDENT_AMBULATORY_CARE_PROVIDER_SITE_OTHER): Payer: BLUE CROSS/BLUE SHIELD | Admitting: Psychiatry

## 2018-09-01 ENCOUNTER — Other Ambulatory Visit: Payer: Self-pay

## 2018-09-01 DIAGNOSIS — F322 Major depressive disorder, single episode, severe without psychotic features: Secondary | ICD-10-CM

## 2018-09-01 DIAGNOSIS — F411 Generalized anxiety disorder: Secondary | ICD-10-CM

## 2018-09-01 MED ORDER — MIRTAZAPINE 15 MG PO TABS: 15.0000 mg | ORAL_TABLET | Freq: Every day | ORAL | 2 refills | Status: DC

## 2018-09-01 MED ORDER — LORAZEPAM 0.5 MG PO TABS: 0.5000 mg | ORAL_TABLET | Freq: Two times a day (BID) | ORAL | 2 refills | Status: DC

## 2018-09-01 NOTE — Progress Notes (Signed)
Virtual Visit via Telephone Note  I connected with Lauren Bolton on 09/01/18 at  2:30 PM EDT by telephone and verified that I am speaking with the correct person using two identifiers.  Location: Patient: home Provider: office   I discussed the limitations, risks, security and privacy concerns of performing an evaluation and management service by telephone and the availability of in person appointments. I also discussed with the patient that there may be a patient responsible charge related to this service. The patient expressed understanding and agreed to proceed.   History of Present Illness: Pt is more nervous since her dose of Synthroid was increased. It was increased 2 weeks ago. Pt is taking Ativan BID and it helps to calm her down. Sleep is ok but she feels she has no energy. She spends the day walking in and outside the house. She will read books and do some chores. Her depression is unchanged. Her appetite is good. Pt denies SI/HI.    Observations/Objective: I spoke with Mayela Thorman on the phone.  Pt was calm, pleasant and cooperative.  Pt was engaged in the conversation and answered questions appropriately.  Speech was coherent with normal rate, tone and volume.  Speech is with a heavy accent and I did have to ask her to repeat her answer a few times. Mood is depressed and anxious, affect is congruent. Thought processes are coherent and intact.  Thought content is with ruminations.  Pt denies SI/HI.   Pt denies auditory and visual hallucinations and did not appear to be responding to internal stimuli.  Memory and concentration are good.  Fund of knowledge and use of language are average.  Insight and judgment are fair.  I am unable to comment on psychomotor activity, general appearance, hygiene, or eye contact as I was unable to physically see the patient on the phone.  Vital signs not available since interview conducted virtually.    Assessment and Plan:  GAD; Panic attacks; MDD-single  episode, moderater  Remeron 15mg  po qHS for mood and anxiety  Ativan 0.5mg  po BID prn anxiety  Pt does not want her meds changed today. I encouraged her to reach out to her PCP regarding her concerns about her thyroid medication.   Follow Up Instructions: In 2 months for sooner if needed   I discussed the assessment and treatment plan with the patient. The patient was provided an opportunity to ask questions and all were answered. The patient agreed with the plan and demonstrated an understanding of the instructions.   The patient was advised to call back or seek an in-person evaluation if the symptoms worsen or if the condition fails to improve as anticipated.  I provided 20 minutes of non-face-to-face time during this encounter.   Charlcie Cradle, MD

## 2018-10-19 DIAGNOSIS — E890 Postprocedural hypothyroidism: Secondary | ICD-10-CM | POA: Diagnosis not present

## 2018-10-25 DIAGNOSIS — F4323 Adjustment disorder with mixed anxiety and depressed mood: Secondary | ICD-10-CM | POA: Diagnosis not present

## 2018-10-25 DIAGNOSIS — E890 Postprocedural hypothyroidism: Secondary | ICD-10-CM | POA: Diagnosis not present

## 2018-10-25 DIAGNOSIS — E785 Hyperlipidemia, unspecified: Secondary | ICD-10-CM | POA: Diagnosis not present

## 2018-10-25 DIAGNOSIS — Z1331 Encounter for screening for depression: Secondary | ICD-10-CM | POA: Diagnosis not present

## 2018-10-25 DIAGNOSIS — R7401 Elevation of levels of liver transaminase levels: Secondary | ICD-10-CM | POA: Diagnosis not present

## 2018-10-27 ENCOUNTER — Encounter (HOSPITAL_COMMUNITY): Payer: Self-pay | Admitting: Psychiatry

## 2018-10-27 ENCOUNTER — Other Ambulatory Visit: Payer: Self-pay

## 2018-10-27 ENCOUNTER — Ambulatory Visit (INDEPENDENT_AMBULATORY_CARE_PROVIDER_SITE_OTHER): Payer: BLUE CROSS/BLUE SHIELD | Admitting: Psychiatry

## 2018-10-27 DIAGNOSIS — F322 Major depressive disorder, single episode, severe without psychotic features: Secondary | ICD-10-CM

## 2018-10-27 DIAGNOSIS — F411 Generalized anxiety disorder: Secondary | ICD-10-CM | POA: Diagnosis not present

## 2018-10-27 MED ORDER — MIRTAZAPINE 15 MG PO TABS: 15.0000 mg | ORAL_TABLET | Freq: Every day | ORAL | 2 refills | Status: DC

## 2018-10-27 MED ORDER — LORAZEPAM 0.5 MG PO TABS: 0.5000 mg | ORAL_TABLET | Freq: Two times a day (BID) | ORAL | 2 refills | Status: DC

## 2018-10-27 NOTE — Progress Notes (Signed)
  Virtual Visit via Telephone Note  I connected with Lauren Bolton  on 10/27/18 at  3:30 PM EDT by telephone and verified that I am speaking with the correct person using two identifiers.  Location: Patient: home Provider: office   I discussed the limitations, risks, security and privacy concerns of performing an evaluation and management service by telephone and the availability of in person appointments. I also discussed with the patient that there may be a patient responsible charge related to this service. The patient expressed understanding and agreed to proceed.   History of Present Illness: Patient states that nothing is really changed.  She remains depressed and anxious.  She spends a lot of time at her home.  She goes from room to room.  She watches TV or takes naps.  Her sleep is not good and her energy is low.  Patient denies SI/HI.  She reports that she does continue to have panic attacks but did not elaborate on the frequency.  She spoke with her PCP earlier this week.  They have made some adjustments to her levothyroxine.  She would like to wait to see the effect before making any changes to her psychiatric medications.    Observations/Objective: I spoke with Lauren Bolton on the phone.  Pt was  cooperative.  Pt was engaged in the conversation and answered questions appropriately.  Speech was with normal rate, tone and volume.  Mood is depressed and anxious, affect is congruent. Thought processes are coherent and intact.  Thought content is with ruminations.  Pt denies SI/HI.   Pt denies auditory and visual hallucinations and did not appear to be responding to internal stimuli.  Memory and concentration are good.  Fund of knowledge and use of language are average.  Insight and judgment are fair.  I am unable to comment on psychomotor activity, general appearance, hygiene, or eye contact as I was unable to physically see the patient on the phone.  Vital signs not available since  interview conducted virtually.     Assessment and Plan: GAD; Panic attacks without agoraphobia; MDD-single episode, moderate  Status of current symptoms: unchanged  Remeron 15mg  po qHS for mood and anxiety  Ativan 0.5mg  po BID prn anxiety.  She has been taking the Ativan for several months and has denied any side effects or adverse reactions.  I declined her request to increase the dose.   Pt does not want her meds changed today and repeatedly asked me to make sure I do not change them.  She spoke with her PCP earlier this week. They are adjusting her thyroid medication and pt wants to wait to see the affect   Follow Up Instructions: In 10-12 weeks or sooner if needed   I discussed the assessment and treatment plan with the patient. The patient was provided an opportunity to ask questions and all were answered. The patient agreed with the plan and demonstrated an understanding of the instructions.   The patient was advised to call back or seek an in-person evaluation if the symptoms worsen or if the condition fails to improve as anticipated.  I provided 15 minutes of non-face-to-face time during this encounter.   Charlcie Cradle, MD

## 2018-11-21 DIAGNOSIS — E890 Postprocedural hypothyroidism: Secondary | ICD-10-CM | POA: Diagnosis not present

## 2019-01-19 ENCOUNTER — Other Ambulatory Visit: Payer: Self-pay

## 2019-01-19 ENCOUNTER — Encounter (HOSPITAL_COMMUNITY): Payer: Self-pay | Admitting: Psychiatry

## 2019-01-19 ENCOUNTER — Ambulatory Visit (INDEPENDENT_AMBULATORY_CARE_PROVIDER_SITE_OTHER): Payer: 59 | Admitting: Psychiatry

## 2019-01-19 DIAGNOSIS — F322 Major depressive disorder, single episode, severe without psychotic features: Secondary | ICD-10-CM | POA: Diagnosis not present

## 2019-01-19 DIAGNOSIS — F411 Generalized anxiety disorder: Secondary | ICD-10-CM | POA: Diagnosis not present

## 2019-01-19 MED ORDER — LORAZEPAM 0.5 MG PO TABS: 0.5000 mg | ORAL_TABLET | Freq: Two times a day (BID) | ORAL | 2 refills | Status: DC

## 2019-01-19 MED ORDER — MIRTAZAPINE 15 MG PO TABS: 22.5000 mg | ORAL_TABLET | Freq: Every day | ORAL | 2 refills | Status: DC

## 2019-01-19 NOTE — Progress Notes (Signed)
Virtual Visit via Telephone Note  I connected with Lauren Bolton on 01/19/19 at  3:00 PM EST by telephone and verified that I am speaking with the correct person using two identifiers.  Location: Patient: home Provider: office   I discussed the limitations, risks, security and privacy concerns of performing an evaluation and management service by telephone and the availability of in person appointments. I also discussed with the patient that there may be a patient responsible charge related to this service. The patient expressed understanding and agreed to proceed.   History of Present Illness: Lauren Bolton shares that her anxiety and depression are worsening. She feels sad all the time and has no motivation to do anything. Lauren Bolton is always tired and her sleep is poor. She spends a lot of time in bed. She has stress induced panic attacks. The anxiety is ongoing but the Ativan helps. She takes it in the morning and at night and it calms her down. She does not go out. Her family stays with her. She denies SI/HI.   Observations/Objective:  There were no vitals taken for this visit.There is no height or weight on file to calculate BMI.  General Appearance: unable to assess  Eye Contact:  unable to assess  Speech:  Clear and Coherent and Normal Rate  Volume:  Normal  Mood:  Anxious and Depressed  Affect:  Congruent  Thought Process:  Goal Directed, Linear and Descriptions of Associations: Intact  Orientation:  Full (Time, Place, and Person)  Thought Content:  Logical  Suicidal Thoughts:  No  Homicidal Thoughts:  No  Memory:  Immediate;   Good  Judgement:  Fair  Insight:  Present  Psychomotor Activity:  unable to assess  Concentration:  Concentration: Good  Recall:  Good  Fund of Knowledge:  Fair  Language:  Fair  Akathisia:  unable to assess  Handed:  Right  AIMS (if indicated):     Assets:  Desire for Improvement Financial Resources/Insurance Housing Leisure Time Social  Support Talents/Skills  ADL's:  Unable to assess  Cognition:  WNL  Sleep:   poor     Assessment and Plan: MDD- single episode, moderate; GAD; Panic attacks without agoraphobia  Status of current symptoms: unchanged  Ativan 0.5mg  po BID prn aniety  Increase Remeron 22.5mg  po qHS for    Follow Up Instructions: In 1 months or sooner if needed   I discussed the assessment and treatment plan with the patient. The patient was provided an opportunity to ask questions and all were answered. The patient agreed with the plan and demonstrated an understanding of the instructions.   The patient was advised to call back or seek an in-person evaluation if the symptoms worsen or if the condition fails to improve as anticipated.  I provided 20 minutes of non-face-to-face time during this encounter.   Oletta Darter, MD

## 2019-03-02 ENCOUNTER — Encounter (HOSPITAL_COMMUNITY): Payer: Self-pay | Admitting: Psychiatry

## 2019-03-02 ENCOUNTER — Other Ambulatory Visit: Payer: Self-pay

## 2019-03-02 ENCOUNTER — Ambulatory Visit (INDEPENDENT_AMBULATORY_CARE_PROVIDER_SITE_OTHER): Payer: 59 | Admitting: Psychiatry

## 2019-03-02 DIAGNOSIS — F411 Generalized anxiety disorder: Secondary | ICD-10-CM

## 2019-03-02 DIAGNOSIS — F322 Major depressive disorder, single episode, severe without psychotic features: Secondary | ICD-10-CM | POA: Diagnosis not present

## 2019-03-02 MED ORDER — LORAZEPAM 0.5 MG PO TABS: 0.5000 mg | ORAL_TABLET | Freq: Two times a day (BID) | ORAL | 2 refills | Status: DC

## 2019-03-02 MED ORDER — MIRTAZAPINE 15 MG PO TABS: 15.0000 mg | ORAL_TABLET | Freq: Every day | ORAL | 0 refills | Status: DC

## 2019-03-02 NOTE — Progress Notes (Signed)
Virtual Visit via Telephone Note  I connected with Lauren Bolton on 03/02/19 at  8:15 AM EST by telephone and verified that I am speaking with the correct person using two identifiers.  Location: Patient: home Provider: office   I discussed the limitations, risks, security and privacy concerns of performing an evaluation and management service by telephone and the availability of in person appointments. I also discussed with the patient that there may be a patient responsible charge related to this service. The patient expressed understanding and agreed to proceed.   History of Present Illness: Lauren Bolton reports no change in depression or anxiety since our last visit. She is scared to change her meds in any way so she only took Remeron 15mg . She knows the COVID restrictions have greatly contributed to her current mental health stressors. She saw her PCP recently and they are checking on her thyroid level. She is always tired and was told to take iron.  Observations/Objective:  General Appearance: unable to assess  Eye Contact:  unable to assess  Speech:  Normal Rate  Volume:  Normal  Mood:  Anxious and Depressed  Affect:  Congruent  Thought Process:  Coherent and Descriptions of Associations: Intact  Orientation:  Full (Time, Place, and Person)  Thought Content:  Logical  Suicidal Thoughts:  No  Homicidal Thoughts:  No  Memory:  Immediate;   Good  Judgement:  Poor  Insight:  Good  Psychomotor Activity:  unable to assess  Concentration:  Concentration: Fair  Recall:  of Knowledge:  Fair  Language:  Fair  Akathisia:  unable to assess  Handed:  Right  AIMS (if indicated):     Assets:  [employer] Intimacy Transportation  ADL's:  Unable to assess  Cognition:  WNL  Sleep:        Assessment and Plan:  Pt never took increased dose of Remeron and prefers to stay at 15mg .  1. GAD (generalized anxiety disorder)  - LORazepam (ATIVAN) 0.5 MG  tablet; Take 1 tablet (0.5 mg total) by mouth 2 (two) times daily.  Dispense: 60 tablet; Refill: 2 - mirtazapine (REMERON) 15 MG tablet; Take 1 tablet (15 mg total) by mouth at bedtime.  Dispense: 90 tablet; Refill: 0  2. Current severe episode of major depressive disorder without psychotic features without prior episode (HCC)  - mirtazapine (REMERON) 15 MG tablet; Take 1 tablet (15 mg total) by mouth at bedtime.  Dispense: 90 tablet; Refill: 0    Follow Up Instructions: In 2-3 months or sooner if needed   I discussed the assessment and treatment plan with the patient. The patient was provided an opportunity to ask questions and all were answered. The patient agreed with the plan and demonstrated an understanding of the instructions.   The patient was advised to call back or seek an in-person evaluation if the symptoms worsen or if the condition fails to improve as anticipated.  I provided 15 minutes of non-face-to-face time during this encounter.   Health and safety inspector, MD

## 2019-05-11 ENCOUNTER — Ambulatory Visit (HOSPITAL_COMMUNITY): Payer: 59 | Admitting: Psychiatry

## 2019-05-25 ENCOUNTER — Other Ambulatory Visit: Payer: Self-pay

## 2019-05-25 ENCOUNTER — Encounter (HOSPITAL_COMMUNITY): Payer: Self-pay | Admitting: Psychiatry

## 2019-05-25 ENCOUNTER — Telehealth (INDEPENDENT_AMBULATORY_CARE_PROVIDER_SITE_OTHER): Payer: 59 | Admitting: Psychiatry

## 2019-05-25 DIAGNOSIS — F322 Major depressive disorder, single episode, severe without psychotic features: Secondary | ICD-10-CM

## 2019-05-25 DIAGNOSIS — F411 Generalized anxiety disorder: Secondary | ICD-10-CM | POA: Diagnosis not present

## 2019-05-25 MED ORDER — MIRTAZAPINE 15 MG PO TABS: 15.0000 mg | ORAL_TABLET | Freq: Every day | ORAL | 0 refills | Status: DC

## 2019-05-25 NOTE — Progress Notes (Signed)
Virtual Visit via Telephone Note  I connected with Lauren Bolton on 05/25/19 at  3:00 PM EDT by telephone and verified that I am speaking with the correct person using two identifiers.  Location: Patient: home Provider: office   I discussed the limitations, risks, security and privacy concerns of performing an evaluation and management service by telephone and the availability of in person appointments. I also discussed with the patient that there may be a patient responsible charge related to this service. The patient expressed understanding and agreed to proceed.   History of Present Illness: Lauren Bolton shares that her depression and anxiety are unchanged. She does not enjoy anything and spends her time doing small things around the house. She wants to work on her thyroid meds as she believes this will help her depression significantly. She denies SI/HI.   Observations/Objective:  General Appearance: unable to assess  Eye Contact:  unable to assess  Speech:  Slow  Volume:  Normal  Mood:  Depressed  Affect:  Congruent  Thought Process:  Coherent and Descriptions of Associations: Intact  Orientation:  Full (Time, Place, and Person)  Thought Content:  Rumination  Suicidal Thoughts:  No  Homicidal Thoughts:  No  Memory:  Immediate;   Good  Judgement:  Poor  Insight:  Present  Psychomotor Activity: unable to assess  Concentration:  Concentration: Fair  Recall:  [employer] of Knowledge:  Fair  Language:  Fair  Akathisia:  unable to assess  Handed:  Right  AIMS (if indicated):     Assets:  Desire for Improvement Financial Resources/Insurance Housing Intimacy Talents/Skills  ADL's:  unable to assess  Cognition:  WNL  Sleep:         Assessment and Plan:  MDD; GAD  Remeron 15mg  po qHS I do think she would benefit from an increased dose but Lauren Bolton prefers to continue this dose and focus on treating her thyroid issues   Follow Up Instructions: In 3 mo or sooner if  needed   I discussed the assessment and treatment plan with the patient. The patient was provided an opportunity to ask questions and all were answered. The patient agreed with the plan and demonstrated an understanding of the instructions.   The patient was advised to call back or seek an in-person evaluation if the symptoms worsen or if the condition fails to improve as anticipated.  I provided 10 minutes of non-face-to-face time during this encounter.   , MD

## 2019-06-12 ENCOUNTER — Telehealth (HOSPITAL_COMMUNITY): Payer: Self-pay | Admitting: *Deleted

## 2019-06-12 ENCOUNTER — Other Ambulatory Visit (HOSPITAL_COMMUNITY): Payer: Self-pay | Admitting: Psychiatry

## 2019-06-12 DIAGNOSIS — F411 Generalized anxiety disorder: Secondary | ICD-10-CM

## 2019-06-12 NOTE — Telephone Encounter (Signed)
Pt husband called requesting a refill for pt Ativan 0.5mg  last written on 03/02/19. Pt has an upcoming appointment on 08/31/19. Please review.

## 2019-06-13 ENCOUNTER — Telehealth (HOSPITAL_COMMUNITY): Payer: Self-pay | Admitting: *Deleted

## 2019-06-13 NOTE — Telephone Encounter (Signed)
Pt husband continues to call requeting a refill on pt lorazepam 0.5mg  last written on 03/02/19 with 2 refills. Pt next upcoming appointment on 08/31/19. Ok to refill? Please review and advise.

## 2019-06-14 ENCOUNTER — Telehealth (HOSPITAL_COMMUNITY): Payer: Self-pay

## 2019-06-14 NOTE — Telephone Encounter (Signed)
Patient called requesting a refill on her Lorazepam 0.5mg  to be sent to [facility] on [address] in [place]. Followup appointment scheduled for 08/31/19. Thank you.

## 2019-06-15 ENCOUNTER — Other Ambulatory Visit (HOSPITAL_COMMUNITY): Payer: Self-pay | Admitting: Psychiatry

## 2019-06-15 DIAGNOSIS — F411 Generalized anxiety disorder: Secondary | ICD-10-CM

## 2019-06-15 MED ORDER — LORAZEPAM 0.5 MG PO TABS: 0.5000 mg | ORAL_TABLET | Freq: Two times a day (BID) | ORAL | 1 refills | Status: DC

## 2019-06-15 NOTE — Telephone Encounter (Signed)
done

## 2019-06-15 NOTE — Progress Notes (Signed)
Refilled her Ativan

## 2019-06-25 ENCOUNTER — Other Ambulatory Visit (HOSPITAL_COMMUNITY): Payer: Self-pay | Admitting: Psychiatry

## 2019-06-25 DIAGNOSIS — F411 Generalized anxiety disorder: Secondary | ICD-10-CM

## 2019-06-25 DIAGNOSIS — F322 Major depressive disorder, single episode, severe without psychotic features: Secondary | ICD-10-CM

## 2019-06-26 ENCOUNTER — Other Ambulatory Visit (HOSPITAL_COMMUNITY): Payer: Self-pay | Admitting: *Deleted

## 2019-06-26 DIAGNOSIS — F322 Major depressive disorder, single episode, severe without psychotic features: Secondary | ICD-10-CM

## 2019-06-26 DIAGNOSIS — F411 Generalized anxiety disorder: Secondary | ICD-10-CM

## 2019-06-26 MED ORDER — MIRTAZAPINE 15 MG PO TABS: 15.0000 mg | ORAL_TABLET | Freq: Every day | ORAL | 0 refills | Status: DC

## 2019-08-11 ENCOUNTER — Other Ambulatory Visit (HOSPITAL_COMMUNITY): Payer: Self-pay | Admitting: Psychiatry

## 2019-08-11 DIAGNOSIS — F411 Generalized anxiety disorder: Secondary | ICD-10-CM

## 2019-08-31 ENCOUNTER — Telehealth (HOSPITAL_COMMUNITY): Payer: 59 | Admitting: Psychiatry

## 2019-08-31 ENCOUNTER — Other Ambulatory Visit: Payer: Self-pay

## 2019-08-31 DIAGNOSIS — F322 Major depressive disorder, single episode, severe without psychotic features: Secondary | ICD-10-CM

## 2019-08-31 DIAGNOSIS — F411 Generalized anxiety disorder: Secondary | ICD-10-CM

## 2019-08-31 NOTE — Progress Notes (Incomplete)
Virtual Visit via Telephone Note  I connected with Lauren Bolton on 08/31/19 at  4:30 PM EDT by telephone and verified that I am speaking with the correct person using two identifiers.  Location: Patient: *** Provider: ***   I discussed the limitations, risks, security and privacy concerns of performing an evaluation and management service by telephone and the availability of in person appointments. I also discussed with the patient that there may be a patient responsible charge related to this service. The patient expressed understanding and agreed to proceed.   History of Present Illness:    Observations/Objective:   Assessment and Plan:   Follow Up Instructions: In 3-4 months or sooner if needed   I discussed the assessment and treatment plan with the patient. The patient was provided an opportunity to ask questions and all were answered. The patient agreed with the plan and demonstrated an understanding of the instructions.   The patient was advised to call back or seek an in-person evaluation if the symptoms worsen or if the condition fails to improve as anticipated.  I provided *** minutes of non-face-to-face time during this encounter.   Oletta Darter, MD

## 2019-09-05 MED ORDER — LORAZEPAM 0.5 MG PO TABS: ORAL_TABLET | ORAL | 3 refills | Status: DC

## 2019-09-05 MED ORDER — MIRTAZAPINE 15 MG PO TABS: ORAL_TABLET | ORAL | 1 refills | Status: DC

## 2020-01-08 ENCOUNTER — Telehealth (HOSPITAL_COMMUNITY): Payer: Self-pay | Admitting: *Deleted

## 2020-01-08 NOTE — Telephone Encounter (Signed)
Pt called requesting refill of the Lorazepam 0.5mg . pt has an appointment on 01/11/20.

## 2020-01-11 ENCOUNTER — Other Ambulatory Visit: Payer: Self-pay

## 2020-01-11 ENCOUNTER — Telehealth (HOSPITAL_COMMUNITY): Payer: 59 | Admitting: Psychiatry

## 2020-01-11 DIAGNOSIS — F322 Major depressive disorder, single episode, severe without psychotic features: Secondary | ICD-10-CM

## 2020-01-11 DIAGNOSIS — F411 Generalized anxiety disorder: Secondary | ICD-10-CM

## 2020-01-11 MED ORDER — MIRTAZAPINE 15 MG PO TABS: 15.0000 mg | ORAL_TABLET | Freq: Every day | ORAL | 2 refills | Status: DC

## 2020-01-11 MED ORDER — LORAZEPAM 0.5 MG PO TABS: ORAL_TABLET | ORAL | 2 refills | Status: DC

## 2020-01-11 NOTE — Progress Notes (Incomplete)
Virtual Visit via Telephone Note  I connected with Lauren Bolton on 01/11/2020 by telephone and verified that I am speaking with the correct person using two identifiers.  Location: Patient: home Provider: office   I discussed the limitations, risks, security and privacy concerns of performing an evaluation and management service by telephone and the availability of in person appointments. I also discussed with the patient that there may be a patient responsible charge related to this service. The patient expressed understanding and agreed to proceed.   History of Present Illness: [name] states nothings has really changed since our last visit. She remains anxious and takes Ativan BID and it calms her. She spends most of her time in her room. [name] does not really much of anything at home or out. Her depression is present but she is more focused on her anxiety. She denies SI/HI. [name] is unwilling to make any changes to her meds because she believes it is all related to a medical    Observations/Objective:  General Appearance: unable to assess  Eye Contact:  unable to assess  Speech:  slow, clear and coherent  Volume:  Normal  Mood:  Anxious  Affect:  Congruent  Thought Process:  Goal Directed and Descriptions of Associations: Intact  Orientation:  Full (Time, Place, and Person)  Thought Content:  Rumination  Suicidal Thoughts:  No  Homicidal Thoughts:  No  Memory:  Immediate;   Good  Judgement:  Poor  Insight:  Present  Psychomotor Activity: unable to assess  Concentration:  Concentration: Good  Recall:  Good  Fund of Knowledge:  Good  Language:  Fair  Akathisia:  unable to assess  Handed:  Right  AIMS (if indicated):     Assets:  Communication Skills Desire for Improvement Financial Resources/Insurance Housing Intimacy Leisure Time Resilience Social Support Talents/Skills Transportation Vocational/Educational  ADL's:  unable to assess  Cognition:  WNL  Sleep:          Assessment and Plan: 1. GAD (generalized anxiety disorder)  2. Current severe episode of major depressive disorder without psychotic features without prior episode (HCC)  - continue Remeron and Ativan  Follow Up Instructions: In 3-4 months or sooner if needed   I discussed the assessment and treatment plan with the patient. The patient was provided an opportunity to ask questions and all were answered. The patient agreed with the plan and demonstrated an understanding of the instructions.   The patient was advised to call back or seek an in-person evaluation if the symptoms worsen or if the condition fails to improve as anticipated.  I provided 12 minutes of non-face-to-face time during this encounter.   Oletta Darter, MD

## 2020-03-13 ENCOUNTER — Telehealth (HOSPITAL_COMMUNITY): Payer: Self-pay | Admitting: *Deleted

## 2020-03-13 NOTE — Telephone Encounter (Signed)
Pt called requesting a refill of the Ativan which should have been refilled on or around on 03/10/20. Pt is completely out.  Appointment for 04/04/20 already cx. Writer spoke with pharmacy and script ws filled today for #60. FYI.

## 2020-03-14 NOTE — Telephone Encounter (Signed)
She did but is calling for another refill FYI

## 2020-03-14 NOTE — Telephone Encounter (Signed)
Ok so she picked up the refill due around 3/6?

## 2020-03-21 ENCOUNTER — Other Ambulatory Visit (HOSPITAL_COMMUNITY): Payer: Self-pay | Admitting: Psychiatry

## 2020-03-21 DIAGNOSIS — F411 Generalized anxiety disorder: Secondary | ICD-10-CM

## 2020-03-21 MED ORDER — LORAZEPAM 0.5 MG PO TABS: ORAL_TABLET | ORAL | 0 refills | Status: DC

## 2020-03-21 NOTE — Telephone Encounter (Signed)
Sent in 30 days supply  

## 2020-04-04 ENCOUNTER — Telehealth (HOSPITAL_COMMUNITY): Payer: 59 | Admitting: Psychiatry

## 2020-04-24 ENCOUNTER — Telehealth (HOSPITAL_COMMUNITY): Payer: Self-pay | Admitting: *Deleted

## 2020-04-24 NOTE — Telephone Encounter (Signed)
Pt called requeting a refill of the Lorazepam 0.5mg . pt has an upcoming appointment on 05/02/20.

## 2020-04-25 ENCOUNTER — Other Ambulatory Visit (HOSPITAL_COMMUNITY): Payer: Self-pay | Admitting: Psychiatry

## 2020-04-25 ENCOUNTER — Other Ambulatory Visit (HOSPITAL_COMMUNITY): Payer: Self-pay | Admitting: *Deleted

## 2020-04-25 DIAGNOSIS — F411 Generalized anxiety disorder: Secondary | ICD-10-CM

## 2020-04-25 DIAGNOSIS — F322 Major depressive disorder, single episode, severe without psychotic features: Secondary | ICD-10-CM

## 2020-04-25 MED ORDER — LORAZEPAM 0.5 MG PO TABS
ORAL_TABLET | ORAL | 0 refills | Status: DC
Start: 2020-04-25 — End: 2020-05-02

## 2020-04-25 MED ORDER — MIRTAZAPINE 15 MG PO TABS: 15.0000 mg | ORAL_TABLET | Freq: Every day | ORAL | 0 refills | Status: DC

## 2020-05-02 ENCOUNTER — Telehealth (INDEPENDENT_AMBULATORY_CARE_PROVIDER_SITE_OTHER): Payer: Medicare Other | Admitting: Psychiatry

## 2020-05-02 ENCOUNTER — Other Ambulatory Visit: Payer: Self-pay

## 2020-05-02 DIAGNOSIS — F322 Major depressive disorder, single episode, severe without psychotic features: Secondary | ICD-10-CM | POA: Diagnosis not present

## 2020-05-02 DIAGNOSIS — F411 Generalized anxiety disorder: Secondary | ICD-10-CM | POA: Diagnosis not present

## 2020-05-02 MED ORDER — LORAZEPAM 0.5 MG PO TABS: 0.5000 mg | ORAL_TABLET | Freq: Two times a day (BID) | ORAL | 3 refills | Status: DC | PRN

## 2020-05-02 MED ORDER — MIRTAZAPINE 15 MG PO TABS
15.0000 mg | ORAL_TABLET | Freq: Every day | ORAL | 0 refills | Status: DC
Start: 2020-05-02 — End: 2020-07-18

## 2020-05-02 NOTE — Progress Notes (Signed)
Virtual Visit via Telephone Note  I connected with Lauren Bolton on 05/02/20 at  2:30 PM EDT by telephone and verified that I am speaking with the correct person using two identifiers.  Location: Patient: home Provider: office   I discussed the limitations, risks, security and privacy concerns of performing an evaluation and management service by telephone and the availability of in person appointments. I also discussed with the patient that there may be a patient responsible charge related to this service. The patient expressed understanding and agreed to proceed.   History of Present Illness: "I feel a little bit better right now". She has been feeling more motivated and active. Her depression is better as compared to before. [name] is sleeping well and her energy is good. She denies SI/HI. Her anxiety comes and goes. Some days she has to take Ativan twice a day and other days she only has to take it once/day. On bad days she has racing thoughts and restlessness. The Ativan helps to calm her down. Her thyroid level is high but he doesn't want to make any med adjustments right now. Her next follow up in Aug 2022.    Observations/Objective:  General Appearance: unable to assess  Eye Contact:  unable to assess  Speech:  Normal Rate and stuttering  Volume:  Normal  Mood:  Anxious and Depressed  Affect:  Full Range  Thought Process:  Goal Directed, Linear and Descriptions of Associations: Intact  Orientation:  Full (Time, Place, and Person)  Thought Content:  Logical  Suicidal Thoughts:  No  Homicidal Thoughts:  No  Memory:  Immediate;   Good  Judgement:  Good  Insight:  Shallow  Psychomotor Activity: unable to assess  Concentration:  Concentration: Good  Recall:  Good  Fund of Knowledge:  Good  Language:  Fair  Akathisia:  unable to assess  Handed:  Right  AIMS (if indicated):     Assets:  Communication Skills Desire for Improvement Financial  Resources/Insurance Housing Intimacy Resilience Social Support Talents/Skills Transportation Vocational/Educational  ADL's:  unable to assess  Cognition:  WNL  Sleep:           Assessment and Plan: Depression screen [facility] 2/9 05/02/2020  Decreased Interest 0  Down, Depressed, Hopeless 1  PHQ - 2 Score 1    Flowsheet Row Video Visit [facility] 05/02/2020 in [facility]  C-SSRS RISK CATEGORY No Risk     1. GAD (generalized anxiety disorder) - LORazepam (ATIVAN) 0.5 MG tablet; Take 1 tablet (0.5 mg total) by mouth 2 (two) times daily as needed for anxiety.  Dispense: 60 tablet; Refill: 3 - mirtazapine (REMERON) 15 MG tablet; Take 1 tablet (15 mg total) by mouth at bedtime.  Dispense: 90 tablet; Refill: 0  2. Current severe episode of major depressive disorder without psychotic features without prior episode (HCC) - mirtazapine (REMERON) 15 MG tablet; Take 1 tablet (15 mg total) by mouth at bedtime.  Dispense: 90 tablet; Refill: 0     Follow Up Instructions: In 2-3 months or sooner if needed   I discussed the assessment and treatment plan with the patient. The patient was provided an opportunity to ask questions and all were answered. The patient agreed with the plan and demonstrated an understanding of the instructions.   The patient was advised to call back or seek an in-person evaluation if the symptoms worsen or if the condition fails to improve as anticipated.  I provided 10 minutes of non-face-to-face time during this  encounter.   Charlcie Cradle, MD

## 2020-07-18 ENCOUNTER — Telehealth (INDEPENDENT_AMBULATORY_CARE_PROVIDER_SITE_OTHER): Payer: Medicare Other | Admitting: Psychiatry

## 2020-07-18 ENCOUNTER — Other Ambulatory Visit: Payer: Self-pay

## 2020-07-18 DIAGNOSIS — F411 Generalized anxiety disorder: Secondary | ICD-10-CM | POA: Diagnosis not present

## 2020-07-18 DIAGNOSIS — F322 Major depressive disorder, single episode, severe without psychotic features: Secondary | ICD-10-CM

## 2020-07-18 MED ORDER — MIRTAZAPINE 15 MG PO TABS: 15.0000 mg | ORAL_TABLET | Freq: Every day | ORAL | 0 refills | Status: DC

## 2020-07-18 MED ORDER — LORAZEPAM 0.5 MG PO TABS: 0.5000 mg | ORAL_TABLET | Freq: Two times a day (BID) | ORAL | 2 refills | Status: DC | PRN

## 2020-07-18 NOTE — Progress Notes (Signed)
Virtual Visit via Telephone Note  I connected with Lauren Bolton on 07/18/20 at  4:00 PM EDT by telephone and verified that I am speaking with the correct person using two identifiers. We were unable to connect by video due to a system error.   Location: Patient: home Provider: office   I discussed the limitations, risks, security and privacy concerns of performing an evaluation and management service by telephone and the availability of in person appointments. I also discussed with the patient that there may be a patient responsible charge related to this service. The patient expressed understanding and agreed to proceed.   History of Present Illness: Lauren Bolton shares that her Synthroid was changed last month. Since then her anxiety has increased. Her depression remains the same. Sometimes she stays in bed and other days she walks around the house. She sometimes has racing thoughts. Her anxiety is mostly in the morning and night and the Ativan helps. Her sleep is poor and her energy is low. Her appetite is poor. It is hard to describe her symptoms. She denies SI/HI. She does not want her meds changed today.    Observations/Objective:  General Appearance: unable to assess  Eye Contact:  unable to assess  Speech:  Clear and Coherent and Slow  Volume:  Decreased  Mood:  Anxious and Depressed  Affect:  Congruent  Thought Process:  Coherent, Linear, and Descriptions of Associations: Intact  Orientation:  Full (Time, Place, and Person)  Thought Content:  Logical  Suicidal Thoughts:  No  Homicidal Thoughts:  No  Memory:  Immediate;   Fair  Judgement:  Poor  Insight:  Fair  Psychomotor Activity: unable to assess  Concentration:  Concentration: Fair  Recall:  [employer] of Knowledge:  Fair  Language:  Poor  Akathisia:  unable to assess  Handed:  Right  AIMS (if indicated):     Assets:  Communication Skills Desire for Improvement Financial Resources/Insurance Housing Intimacy Social  Support Talents/Skills Transportation Vocational/Educational  ADL's:  unable to assess  Cognition:  WNL  Sleep:        Assessment and Plan: 1. GAD (generalized anxiety disorder) - LORazepam (ATIVAN) 0.5 MG tablet; Take 1 tablet (0.5 mg total) by mouth 2 (two) times daily as needed for anxiety.  Dispense: 60 tablet; Refill: 2 - mirtazapine (REMERON) 15 MG tablet; Take 1 tablet (15 mg total) by mouth at bedtime.  Dispense: 90 tablet; Refill: 0  2. Current severe episode of major depressive disorder without psychotic features without prior episode (HCC) - mirtazapine (REMERON) 15 MG tablet; Take 1 tablet (15 mg total) by mouth at bedtime.  Dispense: 90 tablet; Refill: 0  We have discussed increasing the dose of Remeron many times but Lauren Bolton is fearful of doing so. She does not want to try any other medications.  Follow Up Instructions: In 3 months or sooner if needed   I discussed the assessment and treatment plan with the patient. The patient was provided an opportunity to ask questions and all were answered. The patient agreed with the plan and demonstrated an understanding of the instructions.   The patient was advised to call back or seek an in-person evaluation if the symptoms worsen or if the condition fails to improve as anticipated.  I provided 9 minutes of non-face-to-face time during this encounter.   Oletta Darter, MD

## 2020-07-21 ENCOUNTER — Other Ambulatory Visit (HOSPITAL_COMMUNITY): Payer: Self-pay | Admitting: Psychiatry

## 2020-07-21 DIAGNOSIS — F322 Major depressive disorder, single episode, severe without psychotic features: Secondary | ICD-10-CM

## 2020-07-21 DIAGNOSIS — F411 Generalized anxiety disorder: Secondary | ICD-10-CM

## 2020-08-14 DIAGNOSIS — R7301 Impaired fasting glucose: Secondary | ICD-10-CM | POA: Diagnosis not present

## 2020-08-14 DIAGNOSIS — D649 Anemia, unspecified: Secondary | ICD-10-CM | POA: Diagnosis not present

## 2020-08-14 DIAGNOSIS — I700 Atherosclerosis of aorta: Secondary | ICD-10-CM | POA: Diagnosis not present

## 2020-08-14 DIAGNOSIS — E785 Hyperlipidemia, unspecified: Secondary | ICD-10-CM | POA: Diagnosis not present

## 2020-08-14 DIAGNOSIS — E890 Postprocedural hypothyroidism: Secondary | ICD-10-CM | POA: Diagnosis not present

## 2020-09-04 DIAGNOSIS — Z6821 Body mass index (BMI) 21.0-21.9, adult: Secondary | ICD-10-CM | POA: Diagnosis not present

## 2020-09-04 DIAGNOSIS — Z01419 Encounter for gynecological examination (general) (routine) without abnormal findings: Secondary | ICD-10-CM | POA: Diagnosis not present

## 2020-09-04 DIAGNOSIS — Z1231 Encounter for screening mammogram for malignant neoplasm of breast: Secondary | ICD-10-CM | POA: Diagnosis not present

## 2020-10-17 ENCOUNTER — Other Ambulatory Visit: Payer: Self-pay

## 2020-10-17 ENCOUNTER — Telehealth (HOSPITAL_BASED_OUTPATIENT_CLINIC_OR_DEPARTMENT_OTHER): Payer: Medicare Other | Admitting: Psychiatry

## 2020-10-17 DIAGNOSIS — F411 Generalized anxiety disorder: Secondary | ICD-10-CM | POA: Diagnosis not present

## 2020-10-17 DIAGNOSIS — F322 Major depressive disorder, single episode, severe without psychotic features: Secondary | ICD-10-CM | POA: Diagnosis not present

## 2020-10-17 MED ORDER — MIRTAZAPINE 15 MG PO TABS
15.0000 mg | ORAL_TABLET | Freq: Every day | ORAL | 0 refills | Status: DC
Start: 2020-10-17 — End: 2021-01-16

## 2020-10-17 MED ORDER — LORAZEPAM 0.5 MG PO TABS: 0.5000 mg | ORAL_TABLET | Freq: Two times a day (BID) | ORAL | 2 refills | Status: DC | PRN

## 2020-10-17 NOTE — Progress Notes (Signed)
Virtual Visit via Telephone Note  I connected with Lauren Bolton on 10/17/20 at  4:00 PM EDT by telephone and verified that I am speaking with the correct person using two identifiers.  Location: Patient: home Provider: office   I discussed the limitations, risks, security and privacy concerns of performing an evaluation and management service by telephone and the availability of in person appointments. I also discussed with the patient that there may be a patient responsible charge related to this service. The patient expressed understanding and agreed to proceed.   History of Present Illness: "I'm ok". Since our last visit things have been "ok. Its good, not bad, just ok". Her depression has improved and it is more manageable. She is more active at home and is a little more motivated. Her sleep is variable. She does not nap during the daytime. Her anxiety is overall unchanged. She takes the Ativan when her anxiety is overwhelming. It works for 4-5 hrs then she starts feeling shaky and anxious again but tries to distract herself. She takes Ativan again before bed. Lauren Bolton denies SI/HI. She wants to stop the Remeron because her depression is a little better.    Observations/Objective:  General Appearance: unable to assess  Eye Contact:  unable to assess  Speech:  Clear and Coherent and Normal Rate  Volume:  Normal  Mood:  Anxious  Affect:  Congruent  Thought Process:  Goal Directed, Linear, and Descriptions of Associations: Intact  Orientation:  Full (Time, Place, and Person)  Thought Content:  Rumination  Suicidal Thoughts:  No  Homicidal Thoughts:  No  Memory:  Immediate;   Good  Judgement:  Fair  Insight:  Fair  Psychomotor Activity: unable to assess  Concentration:  Concentration: Fair  Recall:  [employer] of Knowledge:  Fair  Language:  Fair  Akathisia:  unable to assess  Handed:  unable to assess  AIMS (if indicated):     Assets:  Manufacturing systems engineer Desire for  Improvement Financial Resources/Insurance Housing Intimacy Social Support Talents/Skills Transportation Vocational/Educational  ADL's:  unable to assess  Cognition:  WNL  Sleep:        Assessment and Plan:  Lauren Bolton does not want the Remeron to be increased due to potential SE. We discussed the benefits of going up on the dose. She doesn't want to try any other medications.   - talked the plan to eventually stop Ativan at the next visit  1. GAD (generalized anxiety disorder) - LORazepam (ATIVAN) 0.5 MG tablet; Take 1 tablet (0.5 mg total) by mouth 2 (two) times daily as needed for anxiety.  Dispense: 60 tablet; Refill: 2 - mirtazapine (REMERON) 15 MG tablet; Take 1 tablet (15 mg total) by mouth at bedtime.  Dispense: 90 tablet; Refill: 0  2. Current severe episode of major depressive disorder without psychotic features without prior episode (HCC) - mirtazapine (REMERON) 15 MG tablet; Take 1 tablet (15 mg total) by mouth at bedtime.  Dispense: 90 tablet; Refill: 0   Follow Up Instructions: In 2-3 months or sooner if needed   I discussed the assessment and treatment plan with the patient. The patient was provided an opportunity to ask questions and all were answered. The patient agreed with the plan and demonstrated an understanding of the instructions.   The patient was advised to call back or seek an in-person evaluation if the symptoms worsen or if the condition fails to improve as anticipated.  I provided 18 minutes of non-face-to-face time during this encounter.  Charlcie Cradle, MD

## 2020-10-19 ENCOUNTER — Other Ambulatory Visit (HOSPITAL_COMMUNITY): Payer: Self-pay | Admitting: Psychiatry

## 2020-10-19 DIAGNOSIS — F411 Generalized anxiety disorder: Secondary | ICD-10-CM

## 2020-10-19 DIAGNOSIS — F322 Major depressive disorder, single episode, severe without psychotic features: Secondary | ICD-10-CM

## 2020-12-18 DIAGNOSIS — R7301 Impaired fasting glucose: Secondary | ICD-10-CM | POA: Diagnosis not present

## 2020-12-18 DIAGNOSIS — E890 Postprocedural hypothyroidism: Secondary | ICD-10-CM | POA: Diagnosis not present

## 2020-12-18 DIAGNOSIS — I700 Atherosclerosis of aorta: Secondary | ICD-10-CM | POA: Diagnosis not present

## 2020-12-18 DIAGNOSIS — D649 Anemia, unspecified: Secondary | ICD-10-CM | POA: Diagnosis not present

## 2020-12-26 ENCOUNTER — Institutional Professional Consult (permissible substitution): Payer: Self-pay | Admitting: Plastic Surgery

## 2021-01-01 ENCOUNTER — Other Ambulatory Visit: Payer: Self-pay

## 2021-01-01 ENCOUNTER — Ambulatory Visit: Payer: Medicare Other | Admitting: Plastic Surgery

## 2021-01-01 VITALS — BP 156/78 | HR 77 | Ht 60.0 in | Wt 104.2 lb

## 2021-01-01 DIAGNOSIS — Z411 Encounter for cosmetic surgery: Secondary | ICD-10-CM

## 2021-01-01 DIAGNOSIS — H02831 Dermatochalasis of right upper eyelid: Secondary | ICD-10-CM | POA: Diagnosis not present

## 2021-01-01 DIAGNOSIS — H02834 Dermatochalasis of left upper eyelid: Secondary | ICD-10-CM

## 2021-01-01 NOTE — Progress Notes (Signed)
Referring Provider Adrian Prince, MD [address] [place],  [place] [address]   CC:  Chief Complaint  Patient presents with   Advice Only      Lauren Bolton is an 60 y.o. female.  HPI: Patient presents today with her sister for evaluation of her upper and lower eyelids.  She is bothered by the heaviness to her upper lids specifically.  She says that she did have an upper lid blepharoplasty in 2016 but feels like the result is diminished over time.  She wants to see what her options are.  She does feel as though the upper lid skin is heavier and affects the vision in the upper aspect of her visual field.  Allergies  Allergen Reactions   Benadryl [Diphenhydramine Hcl] Diarrhea and Other (See Comments)    Reaction:  Makes her excessively sleepy    Alprazolam Diarrhea   Lactose Intolerance (Gi)     Outpatient Encounter Medications as of 01/01/2021  Medication Sig Note   Carboxymethylcellul-Glycerin (CLEAR EYES FOR DRY EYES OP) Place 1 drop into both eyes daily as needed (dry eyes).    Cholecalciferol (VITAMIN D PO) Take 1 tablet by mouth daily.    docusate sodium (COLACE) 100 MG capsule Take 100 mg by mouth daily.    FIBER ADULT GUMMIES PO Take 1 tablet by mouth daily.    ibuprofen (ADVIL,MOTRIN) 200 MG tablet Take 200-400 mg by mouth daily as needed for moderate pain.    iron polysaccharides (NIFEREX) 150 MG capsule Take 1 capsule (150 mg total) by mouth 2 (two) times daily.    levothyroxine (SYNTHROID) 100 MCG tablet Take 1 tablet (100 mcg total) by mouth daily before breakfast. 06/14/2017: 100 mcg Mon-Thurs, 50 mcg on Fridays, none on Sat or Sun   LORazepam (ATIVAN) 0.5 MG tablet Take 1 tablet (0.5 mg total) by mouth 2 (two) times daily as needed for anxiety.    mirtazapine (REMERON) 15 MG tablet Take 1 tablet (15 mg total) by mouth at bedtime.    ondansetron (ZOFRAN-ODT) 4 MG disintegrating tablet Take 1 tablet (4 mg total) by mouth every 8 (eight) hours as needed for nausea or  vomiting.    No facility-administered encounter medications on file as of 01/01/2021.     Past Medical History:  Diagnosis Date   Anxiety    Depression    Iron deficiency anemia    Thyroid disease     Past Surgical History:  Procedure Laterality Date   ESOPHAGOGASTRODUODENOSCOPY N/A 11/13/2015   Procedure: ESOPHAGOGASTRODUODENOSCOPY (EGD);  Surgeon: Dorena Cookey, MD;  Location: [facility];  Service: Endoscopy;  Laterality: N/A;   ESOPHAGOGASTRODUODENOSCOPY (EGD) WITH PROPOFOL N/A 12/26/2015   Procedure: ESOPHAGOGASTRODUODENOSCOPY (EGD) WITH PROPOFOL;  Surgeon: Bernette Redbird, MD;  Location: [facility];  Service: Endoscopy;  Laterality: N/A;   PLACEMENT OF BREAST IMPLANTS     20 years ago   thyroid goiter      Family History  Problem Relation Age of Onset   Hypertension Mother    Thyroid disease Neg Hx     Social History   Social History Narrative   Not on file     Review of Systems General: Denies fevers, chills, weight loss CV: Denies chest pain, shortness of breath, palpitations  Physical Exam Vitals with BMI 01/01/2021 08/19/2017 06/14/2017  Height 5\' 0"  4\' 10"  -  Weight 104 lbs 3 oz 114 lbs 6 oz -  BMI 20.35 23.92 -  Systolic 156 124  Diastolic 78 80 76  Pulse  77 82 68  Some encounter information is confidential and restricted. Go to Review Flowsheets activity to see all data.    General:  No acute distress,  Alert and oriented, Non-Toxic, Normal speech and affect On exam she has symmetric brow position that is appropriate.  She has mild to moderate dermatochalasis of the upper lids.  She has symmetric eyelid margin position with normal levator function.  MRD 1 is 3 to 4 mm.  In the lower lid she has mild traumatic chalasis with a mild tear trough and mild dermatochalasis on the lower lids.  Assessment/Plan Patient presents for evaluation of her upper and lower eyelids.  I do think she may benefit from revision upper blepharoplasty with excision of  additional skin.  I did caution her that the degree of change would be probably less than her last surgery but I do see opportunity for improvement in that area.  Regarding the lower lids similarly I think the improvement to be expected would be fairly subtle.  She seems on the fence about moving forward with the surgeries.  We will plan to get a visual field to see how much the upper lids are affecting her vision and go from there.  All of her questions were answered.  Lauren Bolton 01/01/2021, 5:15 PM

## 2021-01-14 ENCOUNTER — Other Ambulatory Visit (HOSPITAL_COMMUNITY): Payer: Self-pay | Admitting: Psychiatry

## 2021-01-14 DIAGNOSIS — F322 Major depressive disorder, single episode, severe without psychotic features: Secondary | ICD-10-CM

## 2021-01-14 DIAGNOSIS — F411 Generalized anxiety disorder: Secondary | ICD-10-CM

## 2021-01-16 ENCOUNTER — Other Ambulatory Visit: Payer: Self-pay

## 2021-01-16 ENCOUNTER — Telehealth (HOSPITAL_BASED_OUTPATIENT_CLINIC_OR_DEPARTMENT_OTHER): Payer: Medicare Other | Admitting: Psychiatry

## 2021-01-16 DIAGNOSIS — F411 Generalized anxiety disorder: Secondary | ICD-10-CM

## 2021-01-16 DIAGNOSIS — F322 Major depressive disorder, single episode, severe without psychotic features: Secondary | ICD-10-CM

## 2021-01-16 MED ORDER — LORAZEPAM 0.5 MG PO TABS: 0.5000 mg | ORAL_TABLET | Freq: Two times a day (BID) | ORAL | 2 refills | Status: DC | PRN

## 2021-01-16 MED ORDER — MIRTAZAPINE 15 MG PO TABS: 15.0000 mg | ORAL_TABLET | Freq: Every day | ORAL | 0 refills | Status: DC

## 2021-01-16 NOTE — Progress Notes (Signed)
Virtual Visit via Telephone Note  I connected with Lauren Bolton on 01/16/21 at  4:00 PM EST by telephone and verified that I am speaking with the correct person using two identifiers. She does not have a smartphone or laptop to do a video visit today.  Location: Patient: home Provider: office   I discussed the limitations, risks, security and privacy concerns of performing an evaluation and management service by telephone and the availability of in person appointments. I also discussed with the patient that there may be a patient responsible charge related to this service. The patient expressed understanding and agreed to proceed.   History of Present Illness: Lauren Bolton shares she has good and bad days. She has several bad days a week. [place] denies anhedonia and isolation. She goes out with her husband to shop or walk around. She does not think she is very depressed at all. Her anxiety comes and goes randomly. It feels like she is cold and she keeps thinking and checking for an hour until she calms down. Her sleep is poor most nights. It takes her a Tolle time to fall asleep and then she only gets a few hours. She does not nap during the day and tries to stay busy. Lauren Bolton shares that her appetite is variable. Some days she eats 3 meals and some day she has small snacks multiple times a day. She denies SI/HI.    Observations/Objective:  General Appearance: unable to assess  Eye Contact:  unable to assess  Speech:  Clear and Coherent and Slow  Volume:  Normal  Mood:  Anxious and Depressed  Affect:  Full Range  Thought Process:  Goal Directed, Linear, and Descriptions of Associations: Intact  Orientation:  Full (Time, Place, and Person)  Thought Content:  Logical  Suicidal Thoughts:  No  Homicidal Thoughts:  No  Memory:  Immediate;   Good  Judgement:  Fair  Insight:  Shallow  Psychomotor Activity: unable to assess  Concentration:  Concentration: Good  Recall:  Good  Fund of Knowledge:   Fair  Language:  Fair  Akathisia:  unable to assess  Handed:  unable to assess  AIMS (if indicated):     Assets:  Desire for Improvement Financial Resources/Insurance Housing Leisure Time Resilience Social Support Talents/Skills Vocational/Educational  ADL's:  unable to assess  Cognition:  WNL  Sleep:        Assessment and Plan: Depression screen [facility] 2/9 01/16/2021 05/02/2020  Decreased Interest 0 0  Down, Depressed, Hopeless 1 1  PHQ - 2 Score 1 1    Flowsheet Row Video Visit [facility] 01/16/2021 in [facility] Video Visit from 05/02/2020 in [facility]  C-SSRS RISK CATEGORY No Risk No Risk       1. GAD (generalized anxiety disorder) - LORazepam (ATIVAN) 0.5 MG tablet; Take 1 tablet (0.5 mg total) by mouth 2 (two) times daily as needed for anxiety.  Dispense: 60 tablet; Refill: 2 - mirtazapine (REMERON) 15 MG tablet; Take 1 tablet (15 mg total) by mouth at bedtime.  Dispense: 90 tablet; Refill: 0  2. Current severe episode of major depressive disorder without psychotic features without prior episode (HCC) - mirtazapine (REMERON) 15 MG tablet; Take 1 tablet (15 mg total) by mouth at bedtime.  Dispense: 90 tablet; Refill: 0    Follow Up Instructions: In 2-3 months or sooner if needed. Next visit will be in person   I discussed the assessment and treatment plan with the patient.  The patient was provided an opportunity to ask questions and all were answered. The patient agreed with the plan and demonstrated an understanding of the instructions.   The patient was advised to call back or seek an in-person evaluation if the symptoms worsen or if the condition fails to improve as anticipated.  I provided 13 minutes of non-face-to-face time during this encounter.   Oletta Darter, MD

## 2021-04-03 ENCOUNTER — Telehealth (HOSPITAL_BASED_OUTPATIENT_CLINIC_OR_DEPARTMENT_OTHER): Payer: Medicare Other | Admitting: Psychiatry

## 2021-04-03 DIAGNOSIS — F411 Generalized anxiety disorder: Secondary | ICD-10-CM

## 2021-04-03 DIAGNOSIS — F322 Major depressive disorder, single episode, severe without psychotic features: Secondary | ICD-10-CM | POA: Diagnosis not present

## 2021-04-03 MED ORDER — MIRTAZAPINE 15 MG PO TABS: 15.0000 mg | ORAL_TABLET | Freq: Every day | ORAL | 0 refills | Status: DC

## 2021-04-03 MED ORDER — LORAZEPAM 0.5 MG PO TABS: 0.5000 mg | ORAL_TABLET | Freq: Two times a day (BID) | ORAL | 1 refills | Status: DC | PRN

## 2021-04-03 NOTE — Progress Notes (Signed)
Virtual Visit via Telephone Note ? ?I connected with Lauren Bolton on 04/03/21 at  4:00 PM EDT by telephone and verified that I am speaking with the correct person using two identifiers. ? ?Location: ?Patient: home ?Provider: office ?  ?I discussed the limitations, risks, security and privacy concerns of performing an evaluation and management service by telephone and the availability of in person appointments. I also discussed with the patient that there may be a patient responsible charge related to this service. The patient expressed understanding and agreed to proceed. ? ? ?History of Present Illness: ?"I'm ok". Lauren Bolton has been a little more active and has been going out more. She has some anxiety that comes and goes thru out the day. Depression "is ok, not that bad". She gets bored easily. She denies anhedonia. ?Her sleep is ok. Her appetite is unchanged. She denies SI/HI.  ? ?Observations/Objective: ? ?General Appearance: unable to assess  ?Eye Contact:  unable to assess  ?Speech:  Slow  ?Volume:  Normal  ?Mood:  Anxious and Depressed  ?Affect:  Full Range and brighter than previous visit  ?Thought Process:  Coherent and Descriptions of Associations: Intact  ?Orientation:  Full (Time, Place, and Person)  ?Thought Content:  Logical  ?Suicidal Thoughts:  No  ?Homicidal Thoughts:  No  ?Memory:  Immediate;   Good  ?Judgement:  Fair  ?Insight:  Fair  ?Psychomotor Activity: unable to assess  ?Concentration:  Concentration: Good  ?Recall:  Good  ?Fund of Knowledge:  Good  ?Language:  Fair  ?Akathisia:  unable to assess  ?Handed:  unable to assess  ?AIMS (if indicated):     ?Assets:  Desire for Improvement ?Financial Resources/Insurance ?Housing ?Intimacy ?Leisure Time ?Social Support ?Talents/Skills ?Transportation ?Vocational/Educational  ?ADL's:  unable to assess  ?Cognition:  WNL  ?Sleep:     ? ? ? ?Assessment and Plan: ? ?  04/03/2021  ?  4:07 PM 01/16/2021  ?  4:09 PM 05/02/2020  ?  2:37 PM  ?Depression screen PHQ  2/9  ?Decreased Interest 0 0 0  ?Down, Depressed, Hopeless 2 1 1   ?PHQ - 2 Score 2 1 1   ?Altered sleeping 2    ?Tired, decreased energy 1    ?Change in appetite 3    ?Feeling bad or failure about yourself  2    ?Trouble concentrating 0    ?Moving slowly or fidgety/restless 0    ?Suicidal thoughts 0    ?PHQ-9 Score 10    ?Difficult doing work/chores Very difficult    ? ? ?Flowsheet Row Video Visit from 04/03/2021 in [facility] Video Visit from 01/16/2021 in [facility] Video Visit from 05/02/2020 in [facility]  ?C-SSRS RISK CATEGORY No Risk No Risk No Risk  ? ?  ? ? ?1. GAD (generalized anxiety disorder) ?- LORazepam (ATIVAN) 0.5 MG tablet; Take 1 tablet (0.5 mg total) by mouth 2 (two) times daily as needed for anxiety.  Dispense: 60 tablet; Refill: 1 ?- mirtazapine (REMERON) 15 MG tablet; Take 1 tablet (15 mg total) by mouth at bedtime.  Dispense: 90 tablet; Refill: 0 ? ?2. Current severe episode of major depressive disorder without psychotic features without prior episode ([place]) ?- mirtazapine (REMERON) 15 MG tablet; Take 1 tablet (15 mg total) by mouth at bedtime.  Dispense: 90 tablet; Refill: 0 ? ? ? ?Follow Up Instructions: ?In 2-3 months or sooner if needed- in person visit with interpretor  ?  ?I discussed  the assessment and treatment plan with the patient. The patient was provided an opportunity to ask questions and all were answered. The patient agreed with the plan and demonstrated an understanding of the instructions. ?  ?The patient was advised to call back or seek an in-person evaluation if the symptoms worsen or if the condition fails to improve as anticipated. ? ?I provided 8 minutes of non-face-to-face time during this encounter. ? ? ?Charlcie Cradle, MD ? ?

## 2021-04-23 DIAGNOSIS — E890 Postprocedural hypothyroidism: Secondary | ICD-10-CM | POA: Diagnosis not present

## 2021-04-23 DIAGNOSIS — I700 Atherosclerosis of aorta: Secondary | ICD-10-CM | POA: Diagnosis not present

## 2021-04-23 DIAGNOSIS — D649 Anemia, unspecified: Secondary | ICD-10-CM | POA: Diagnosis not present

## 2021-04-23 DIAGNOSIS — R7301 Impaired fasting glucose: Secondary | ICD-10-CM | POA: Diagnosis not present

## 2021-05-07 DIAGNOSIS — H02831 Dermatochalasis of right upper eyelid: Secondary | ICD-10-CM | POA: Diagnosis not present

## 2021-05-07 DIAGNOSIS — H25813 Combined forms of age-related cataract, bilateral: Secondary | ICD-10-CM | POA: Diagnosis not present

## 2021-05-07 DIAGNOSIS — H02834 Dermatochalasis of left upper eyelid: Secondary | ICD-10-CM | POA: Diagnosis not present

## 2021-05-07 DIAGNOSIS — H02402 Unspecified ptosis of left eyelid: Secondary | ICD-10-CM | POA: Diagnosis not present

## 2021-06-05 ENCOUNTER — Ambulatory Visit (HOSPITAL_COMMUNITY): Payer: Medicare Other | Admitting: Psychiatry

## 2021-06-05 ENCOUNTER — Encounter (HOSPITAL_COMMUNITY): Payer: Self-pay | Admitting: Psychiatry

## 2021-06-05 VITALS — BP 136/85 | HR 85 | Temp 97.8°F | Ht 59.5 in | Wt 100.8 lb

## 2021-06-05 DIAGNOSIS — F411 Generalized anxiety disorder: Secondary | ICD-10-CM | POA: Diagnosis not present

## 2021-06-05 DIAGNOSIS — F322 Major depressive disorder, single episode, severe without psychotic features: Secondary | ICD-10-CM

## 2021-06-05 MED ORDER — LORAZEPAM 0.5 MG PO TABS: 0.2500 mg | ORAL_TABLET | Freq: Two times a day (BID) | ORAL | 0 refills | Status: DC | PRN

## 2021-06-05 NOTE — Progress Notes (Signed)
BH MD/PA/NP OP Progress Note  06/05/2021 4:56 PM Lauren Bolton  MRN:  [id number]  Chief Complaint:  Chief Complaint  Patient presents with   Anxiety   HPI: I met with Lauren Bolton and her husband in the office today.  She was accompanied by a [place] interpreter.  Patient states that her depression is completely resolved.  She denies isolation, anhedonia and hopelessness.  She denies SI/HI.  Her anxiety is ongoing.  She takes Ativan 0.5 mg daily and about 2-3 times a week she takes 1 mg when her anxiety is higher.  The affect last for 3 to 4 hours.  She usually tries to distract herself as she takes the Ativan and that combination seems to help resolve her anxiety.  Patient has not been able to identify any triggers for her anxiety.  She states that her health is fine and she had her thyroid checked last month.  She does not think that her thyroid problems contribute to her anxiety.  On a day-to-day basis she is active and social.  Her sleep is variable most nights.  When she gets anxious she gets shaky, feels cold and tired and uncomfortable.  She does not think the Remeron helps with any of her symptoms and wants to stop it.  She would prefer to continue on with Ativan use.  Visit Diagnosis:    ICD-10-CM   1. Current severe episode of major depressive disorder without psychotic features without prior episode ([place])  F32.2     2. GAD (generalized anxiety disorder)  F41.1 LORazepam (ATIVAN) 0.5 MG tablet      Past Psychiatric History: no updates or changes per patient  Past Medical History:  Past Medical History:  Diagnosis Date   Anxiety    Depression    Iron deficiency anemia    Thyroid disease     Past Surgical History:  Procedure Laterality Date   ESOPHAGOGASTRODUODENOSCOPY N/A 11/13/2015   Procedure: ESOPHAGOGASTRODUODENOSCOPY (EGD);  Surgeon: Teena Irani, MD;  Location: [facility];  Service: Endoscopy;  Laterality: N/A;   ESOPHAGOGASTRODUODENOSCOPY (EGD) WITH PROPOFOL N/A 12/26/2015    Procedure: ESOPHAGOGASTRODUODENOSCOPY (EGD) WITH PROPOFOL;  Surgeon: Ronald Lobo, MD;  Location: [facility];  Service: Endoscopy;  Laterality: N/A;   PLACEMENT OF BREAST IMPLANTS     20 years ago   thyroid goiter      Family Psychiatric and Medical History:  Family History  Problem Relation Age of Onset   Hypertension Mother    Thyroid disease Neg Hx     Social History:  Social History   Socioeconomic History   Marital status: Married    Spouse name: Not on file   Number of children: Not on file   Years of education: Not on file   Highest education level: Not on file  Occupational History   Not on file  Tobacco Use   Smoking status: Never   Smokeless tobacco: Never  Vaping Use   Vaping Use: Never used  Substance and Sexual Activity   Alcohol use: No   Drug use: No   Sexual activity: Not Currently  Other Topics Concern   Not on file  Social History Narrative   Not on file   Social Determinants of Health   Financial Resource Strain: Not on file  Food Insecurity: Not on file  Transportation Needs: Not on file  Physical Activity: Not on file  Stress: Not on file  Social Connections: Not on file    Allergies:  Allergies  Allergen Reactions  Benadryl [Diphenhydramine Hcl] Diarrhea and Other (See Comments)    Reaction:  Makes her excessively sleepy    Alprazolam Diarrhea   Lactose Intolerance (Gi)     Metabolic Disorder Labs: No results found for: HGBA1C, MPG No results found for: PROLACTIN No results found for: CHOL, TRIG, HDL, CHOLHDL, VLDL, LDLCALC Lab Results  Component Value Date   TSH 0.70 08/19/2017   TSH 0.380 06/14/2017    Therapeutic Level Labs: No results found for: LITHIUM No results found for: VALPROATE No components found for:  CBMZ  Current Medications: Current Outpatient Medications  Medication Sig Dispense Refill   Carboxymethylcellul-Glycerin (CLEAR EYES FOR DRY EYES OP) Place 1 drop into both eyes daily as needed (dry  eyes).     Cholecalciferol (VITAMIN D PO) Take 1 tablet by mouth daily.     FIBER ADULT GUMMIES PO Take 1 tablet by mouth daily.     ibuprofen (ADVIL,MOTRIN) 200 MG tablet Take 200-400 mg by mouth daily as needed for moderate pain.     iron polysaccharides (NIFEREX) 150 MG capsule Take 1 capsule (150 mg total) by mouth 2 (two) times daily. 60 capsule 0   levothyroxine (SYNTHROID) 100 MCG tablet Take 1 tablet (100 mcg total) by mouth daily before breakfast. 30 tablet 0   docusate sodium (COLACE) 100 MG capsule Take 100 mg by mouth daily. (Patient not taking: Reported on 04/03/2021)     LORazepam (ATIVAN) 0.5 MG tablet Take 0.5 tablets (0.25 mg total) by mouth 2 (two) times daily as needed for anxiety. 30 tablet 0   ondansetron (ZOFRAN-ODT) 4 MG disintegrating tablet Take 1 tablet (4 mg total) by mouth every 8 (eight) hours as needed for nausea or vomiting. (Patient not taking: Reported on 04/03/2021) 8 tablet 0   No current facility-administered medications for this visit.     Musculoskeletal: Strength & Muscle Tone: within normal limits Gait & Station: normal Patient leans:  straigh  Psychiatric Specialty Exam: Review of Systems  Blood pressure 136/85, pulse 85, temperature 97.8 F (36.6 C), height 4' 11.5" (1.511 m), weight 100 lb 12.8 oz (45.7 kg), SpO2 100 %.Body mass index is 20.02 kg/m.  General Appearance: Casual and Neat  Eye Contact:  Good  Speech:  Clear and Coherent and Normal Rate  Volume:  Normal  Mood:  Anxious  Affect:  Full Range  Thought Process:  Coherent and Descriptions of Associations: Intact  Orientation:  Full (Time, Place, and Person)  Thought Content: Rumination   Suicidal Thoughts:  No  Homicidal Thoughts:  No  Memory:  Immediate;   Good  Judgement:  Fair  Insight:  Good  Psychomotor Activity:  Normal  Concentration:  Concentration: Good  Recall:  Good  Fund of Knowledge: Good  Language: Good  Akathisia:  No  Handed:  Right  AIMS (if indicated): not  done  Assets:  Communication Skills Desire for Improvement Financial Resources/Insurance Housing Intimacy Leisure Time Resilience Social Support Talents/Skills Transportation Vocational/Educational  ADL's:  Intact  Cognition: WNL  Sleep:  Fair   Screenings: AIMS    Flowsheet Row Admission (Discharged) from 02/06/2016 in [facility]  AIMS Total Score 0      AUDIT    Flowsheet Row Admission (Discharged) from 02/06/2016 in [facility]  Alcohol Use Disorder Identification Test Final Score (AUDIT) 0      PHQ2-9    Flowsheet Row Video Visit from 04/03/2021 in [facility] Video Visit from 01/16/2021  in [facility] Video Visit from 05/02/2020 in [facility]  PHQ-2 Total Score _0 PHQ-9 Total Score 10 -- --      Flowsheet Row Video Visit from 04/03/2021 in [facility] Video Visit from 01/16/2021 in [facility] Video Visit from 05/02/2020 in [facility] No Risk No Risk No Risk        Assessment and Plan:     Medication management with supportive therapy. Risks and benefits, side effects and alternative treatment options discussed with patient. Pt was given an opportunity to ask questions about medication, illness, and treatment. All current psychiatric medications have been reviewed and discussed with the patient and adjusted as clinically appropriate. The patient has been provided an accurate and updated list of the medications being now prescribed. Pt verbalized understanding and verbal consent obtained for treatment.   Status of current problems: ongoing anxiety, no depression symptoms  Meds: D/c Remeron per patient request.  We discussed potential  risk of insomnia, worsening anxiety and depression.  She and her husband verbalized understanding.  She does not want to try any other medications for anxiety or depression.  She took Zoloft for a little while but stopped it due to side effects.  In the past when she increased the Remeron dose to 30 mg she experienced insomnia and stopped taking it.  Decrease Ativan to 0.40m BID prn anxiety. We talked about the need to decrease use from daily to a few times a week. She is taking 0.584mevery day and sometimes takes 26m24mhen overly anxious. My goal is to wean her down slowly.    Labs: none  Therapy: brief supportive therapy provided.   F/up in 3-4 weeks or sooner if needed  The duration of this appointment visit was 25 minutes of face-to-face time with the patient.  Greater than 50% of this time was spent in counseling, explanation of  diagnosis, planning of further management, and coordination of care    Collaboration of Care: Collaboration of Care: Other none today  Patient/Guardian was advised Release of Information must be obtained prior to any record release in order to collaborate their care with an outside provider. Patient/Guardian was advised if they have not already done so to contact the registration department to sign all necessary forms in order for us [place] release information regarding their care.   Consent: Patient/Guardian gives verbal consent for treatment and assignment of benefits for services provided during this visit. Patient/Guardian expressed understanding and agreed to proceed.    SalCharlcie CradleD 06/05/2021, 4:56 PM

## 2021-07-03 ENCOUNTER — Telehealth (HOSPITAL_BASED_OUTPATIENT_CLINIC_OR_DEPARTMENT_OTHER): Payer: Medicare Other | Admitting: Psychiatry

## 2021-07-03 DIAGNOSIS — F411 Generalized anxiety disorder: Secondary | ICD-10-CM | POA: Diagnosis not present

## 2021-07-03 DIAGNOSIS — F322 Major depressive disorder, single episode, severe without psychotic features: Secondary | ICD-10-CM

## 2021-07-03 MED ORDER — LORAZEPAM 0.5 MG PO TABS: 0.2500 mg | ORAL_TABLET | Freq: Two times a day (BID) | ORAL | 2 refills | Status: DC | PRN

## 2021-07-03 NOTE — Progress Notes (Signed)
Virtual Visit via phone Note  I connected with Lauren Bolton on 07/03/21 at  2:45 PM EDT by phone. She was unable to connect by a video enabled telemedicine application and verified that I am speaking with the correct person using two identifiers.  Location: Patient: home Provider: office   I discussed the limitations of evaluation and management by telemedicine and the availability of in person appointments. The patient expressed understanding and agreed to proceed.  History of Present Illness: Lauren Bolton states she is doing ok. She is off Remeron completely. She is taking at least 1 tab of Ativan each day at bedtime.  Her sleep schedule is variable and she is getting anywhere from 6-7 hrs. Lauren Bolton shares her anxiety is ongoing. At times she feels like her body is shaking and she feels hot and cold. It is variable and can happen 2-4x/week. She will then take Ativan and it helps. Overall her anxiety feels the same. Lauren Bolton denies depression and isolation and anhedonia. She denies SI/HI.    Observations/Objective: Psychiatric Specialty Exam:  General Appearance: unable to assess  Eye Contact:  unable to assess  Speech:  Clear and Coherent and Slow  Volume:  Normal  Mood:  Anxious  Affect:  Full Range  Thought Process:  Goal Directed, Linear, and Descriptions of Associations: Intact  Orientation:  Full (Time, Place, and Person)  Thought Content:  Logical  Suicidal Thoughts:  No  Homicidal Thoughts:  No  Memory:  Immediate;   Good  Judgement:  Good  Insight:  Good  Psychomotor Activity: unable to assess  Concentration:  Concentration: Good  Recall:  Good  Fund of Knowledge:  Good  Language:  Good  Akathisia:  unable to assess  Handed:  unable to assess  AIMS (if indicated):     Assets:  Communication Skills Desire for Improvement Financial Resources/Insurance Housing Intimacy Leisure Time Resilience Social Support Talents/Skills Transportation Vocational/Educational  ADL's:   unable to assess  Cognition:  WNL  Sleep:         Assessment and Plan:     07/03/2021    3:08 PM 04/03/2021    4:07 PM 01/16/2021    4:09 PM 05/02/2020    2:37 PM  Depression screen PHQ 2/9  Decreased Interest 0 0 0 0  Down, Depressed, Hopeless 0 2 1 1   PHQ - 2 Score 0 2 1 1   Altered sleeping  2    Tired, decreased energy  1    Change in appetite  3    Feeling bad or failure about yourself   2    Trouble concentrating  0    Moving slowly or fidgety/restless  0    Suicidal thoughts  0    PHQ-9 Score  10    Difficult doing work/chores  Very difficult      Flowsheet Row [facility]03[facility] Video Visit from 04/03/2021 in [facility] Video Visit from 01[facility]16/2021 in [facility]  C-SSRS RISK CATEGORY No Risk No Risk No Risk          Status of current problems: anxiety is ongoing  Meds:  1. GAD (generalized anxiety disorder) - LORazepam (ATIVAN) 0.5 MG tablet; Take 0.5 tablets (0.25 mg total) by mouth 2 (two) times daily as needed for anxiety.  Dispense: 30 tablet; Refill: 2  2. Current severe episode of major depressive disorder without psychotic features without prior episode [facility]) - goal is to wean  off Ativan. She has cut back to .25mg  daily and an extra 0.25mg  a couple of times a week. She is aware of the plan to discontinue in the near future.     Labs: none    Therapy: brief supportive therapy provided.   Collaboration of Care: Other none  Patient/Guardian was advised Release of Information must be obtained prior to any record release in order to collaborate their care with an outside provider. Patient/Guardian was advised if they have not already done so to contact the registration department to sign all necessary forms in order for to release information regarding their care.   Consent: Patient/Guardian gives verbal  consent for treatment and assignment of benefits for services provided during this visit. Patient/Guardian expressed understanding and agreed to proceed.     Follow Up Instructions: Follow up in 3 months or sooner if needed    I discussed the assessment and treatment plan with the patient. The patient was provided an opportunity to ask questions and all were answered. The patient agreed with the plan and demonstrated an understanding of the instructions.   The patient was advised to call back or seek an in-person evaluation if the symptoms worsen or if the condition fails to improve as anticipated.  I provided 8 minutes of non-face-to-face time during this encounter.   [place], MD

## 2021-07-28 ENCOUNTER — Ambulatory Visit: Payer: Medicare Other | Admitting: Physician Assistant

## 2021-07-28 DIAGNOSIS — H02831 Dermatochalasis of right upper eyelid: Secondary | ICD-10-CM

## 2021-07-28 DIAGNOSIS — H02834 Dermatochalasis of left upper eyelid: Secondary | ICD-10-CM | POA: Diagnosis not present

## 2021-07-28 NOTE — Progress Notes (Signed)
   Referring Provider Adrian Prince, MD [address] [place],  [place] [address]   CC:  Chief Complaint  Patient presents with   Skin Problem      Lauren Bolton is an 61 y.o. female.  HPI: Patient is a 61 y.o. year old female here for follow up after completing visual field testing for dermatochalasis bilateral upper and lower eyelids.  She was seen for initial consult by Dr. Arita Miss.  At that time, she described history of previous upper eyelid blepharoplasty performed in 2016.  However, she is bothered by persistent heaviness to her upper lid specifically as well as visual impairment.  MRD 1 is 3 to 4 mm.  Symmetric brow position.  Mild to moderate dermatochalasis of upper lids.  Mild dermatochalasis on lower lids.  She was not fully convinced that she wanted to proceed to surgery, but plan was for visual field test to see how affected her vision is from her mild dermatochalasis.  Pictures were obtained at that time.  Today, she is accompanied by her husband at bedside.  While she is anxious at the prospect of having surgery, she does want to proceed.  She states that underneath the excess skin beneath her eyes she experiences itching symptoms.  As for her upper eyelids, she reports that they are heavy and obstructs her vision.  She presents with documentation from ophthalmology appointment for visual field testing.  She understands that Dr. Arita Miss is no longer with the clinic.   Review of Systems HEENT: Upper eyelid heaviness and lower eyelid itching Skin: Denies rashes  Physical Exam    06/05/2021    2:55 PM 01/01/2021    4:14 PM 02/03/2018    2:56 PM  Vitals with BMI  Height  5\' 0"    Weight  104 lbs 3 oz   BMI  20.35   Systolic  156   Diastolic  78   Pulse  77      Information is confidential and restricted. Go to Review Flowsheets to unlock data.    General:  No acute distress,  Alert and oriented, Non-Toxic, Normal speech and affect Psych: Normal behavior and  mood Respiratory: No increased WOB MSK: Ambulatory HEENT: Mild upper and lower dermatochalasis bilaterally.  PERRL and EOM intact.  No proptosis.  Assessment/Plan  Patient is interested in pursuing surgical intervention for symptomatic dermatochalasis. Patient has completed visual field testing.  Dr. was available to provide consult here in clinic today and meet with the patient, per her request.  Discussed focusing on upper lids.  Patient states that she would prefer to hold off on lower lid blepharoplasty given that her lower lid dermatochalasis is minimal.  Instead, we will submit to insurance for upper eyelid blepharoplasty with plan to focus on removing majority of skin from lateral aspects.  Discussed with patient we would submit to insurance for authorization, discussed approval could take up to 6 weeks.    Lauren Bolton 07/28/2021, 9:13 AM

## 2021-08-25 ENCOUNTER — Telehealth: Payer: Self-pay | Admitting: Plastic Surgery

## 2021-08-25 NOTE — Telephone Encounter (Signed)
Received call from Lauren Bolton. from [place] regarding auth #[id number]; requesting call back with date of procedure. Stated she put the authorization through for 8/10 thru 09/14/21 since the CMA wasn't available to verify and advised it's ok to call back to request an extension if needed.  Please advise at [phone].

## 2021-08-29 ENCOUNTER — Institutional Professional Consult (permissible substitution): Payer: Medicare Other | Admitting: Plastic Surgery

## 2021-09-25 ENCOUNTER — Telehealth (HOSPITAL_BASED_OUTPATIENT_CLINIC_OR_DEPARTMENT_OTHER): Payer: Medicare Other | Admitting: Psychiatry

## 2021-09-25 DIAGNOSIS — F411 Generalized anxiety disorder: Secondary | ICD-10-CM

## 2021-09-25 DIAGNOSIS — F330 Major depressive disorder, recurrent, mild: Secondary | ICD-10-CM

## 2021-09-25 MED ORDER — LORAZEPAM 0.5 MG PO TABS: 0.2500 mg | ORAL_TABLET | Freq: Two times a day (BID) | ORAL | 1 refills | Status: DC | PRN

## 2021-09-25 NOTE — Progress Notes (Signed)
Virtual Visit via Phone Note  I connected with Lauren Bolton on 09/25/21 at  3:00 PM EDT by phone. I verified that I am speaking with the correct person using two identifiers.  Location: Patient: home Provider: office   I discussed the limitations of evaluation and management by telemedicine and the availability of in person appointments. The patient expressed understanding and agreed to proceed.  History of Present Illness: Lauren Bolton shares she is doing ok. She struggles with anxiety at times several times a week. Sometimes it can last 4-5 days and other times for a few minutes. She Ativan to calm down. She takes 1 tab daily instead of 1/2 tab as prescribed. She forgot she was only supposed to take 1/2 tab. She denies depression and anhedonia. She denies SI/HI.   Observations/Objective: Psychiatric Specialty Exam:  General Appearance: unable to assess  Eye Contact:  unable to assess  Speech:  Slow and halting, tone normal  Volume:  Normal  Mood:  Anxious  Affect:  Congruent  Thought Process:  Coherent and Descriptions of Associations: Intact  Orientation:  Full (Time, Place, and Person)  Thought Content:  Rumination  Suicidal Thoughts:  No  Homicidal Thoughts:  No  Memory:  Immediate;   Good  Judgement:  Fair  Insight:  Fair  Psychomotor Activity: unable to assess  Concentration:  Concentration: Good  Recall:  Good  Fund of Knowledge:  Good  Language:  Fair  Akathisia:  unable to assess  Handed:  unable to assess  AIMS (if indicated):     Assets:  Communication Skills Desire for Improvement Financial Resources/Insurance Housing Intimacy Leisure Time Resilience Social Support Talents/Skills Transportation Vocational/Educational  ADL's:  unable to assess  Cognition:  WNL  Sleep:         Assessment and Plan:     09/25/2021    3:12 PM 07/03/2021    3:08 PM 04/03/2021    4:07 PM 01/16/2021    4:09 PM 05/02/2020    2:37 PM  Depression screen PHQ 2/9  Decreased  Interest 0 0 0 0 0  Down, Depressed, Hopeless 0 0 2 1 1   PHQ - 2 Score 0 0 2 1 1   Altered sleeping   2    Tired, decreased energy   1    Change in appetite   3    Feeling bad or failure about yourself    2    Trouble concentrating   0    Moving slowly or fidgety/restless   0    Suicidal thoughts   0    PHQ-9 Score   10    Difficult doing work/chores   Very difficult      Flowsheet Row Video Visit from 07/03/2021 in [facility] Video Visit from 04/03/2021 in [facility] Video Visit from 01/16/2021 in [place] No Risk No Risk No Risk         Pt is aware that these meds carry a teratogenic risk. Pt will discuss plan of action if she does or plans to become pregnant in the future.  Status of current problems: ongoing anxiety  Meds: decrease Ativan to 0.25mg  po BID prn anxiety. Plan is to still wean off benzos 1. GAD (generalized anxiety disorder) - LORazepam (ATIVAN) 0.5 MG tablet; Take 0.5 tablets (0.25 mg total) by mouth 2 (two) times daily as needed for anxiety.  Dispense: 30 tablet; Refill: 1  2. MDD (major depressive  disorder), recurrent episode, mild ([place])     Labs: none    Therapy: brief supportive therapy provided.    Collaboration of Care: Other none  Patient/Guardian was advised Release of Information must be obtained prior to any record release in order to collaborate their care with an outside provider. Patient/Guardian was advised if they have not already done so to contact the registration department to sign all necessary forms in order for [place] to release information regarding their care.   Consent: Patient/Guardian gives verbal consent for treatment and assignment of benefits for services provided during this visit. Patient/Guardian expressed understanding and agreed to proceed.      Follow Up Instructions: Follow up in  2-3 months or sooner if needed    I discussed the assessment and treatment plan with the patient. The patient was provided an opportunity to ask questions and all were answered. The patient agreed with the plan and demonstrated an understanding of the instructions.   The patient was advised to call back or seek an in-person evaluation if the symptoms worsen or if the condition fails to improve as anticipated.  I provided 11 minutes of non-face-to-face time during this encounter.   Charlcie Cradle, MD

## 2021-10-01 DIAGNOSIS — Z1231 Encounter for screening mammogram for malignant neoplasm of breast: Secondary | ICD-10-CM | POA: Diagnosis not present

## 2021-10-01 DIAGNOSIS — Z6820 Body mass index (BMI) 20.0-20.9, adult: Secondary | ICD-10-CM | POA: Diagnosis not present

## 2021-10-01 DIAGNOSIS — Z01419 Encounter for gynecological examination (general) (routine) without abnormal findings: Secondary | ICD-10-CM | POA: Diagnosis not present

## 2021-10-15 ENCOUNTER — Institutional Professional Consult (permissible substitution): Payer: Medicare Other | Admitting: Plastic Surgery

## 2021-10-22 ENCOUNTER — Ambulatory Visit (INDEPENDENT_AMBULATORY_CARE_PROVIDER_SITE_OTHER): Payer: Medicare Other | Admitting: Plastic Surgery

## 2021-10-22 ENCOUNTER — Encounter: Payer: Self-pay | Admitting: Plastic Surgery

## 2021-10-22 DIAGNOSIS — H02834 Dermatochalasis of left upper eyelid: Secondary | ICD-10-CM | POA: Diagnosis not present

## 2021-10-22 DIAGNOSIS — H02831 Dermatochalasis of right upper eyelid: Secondary | ICD-10-CM

## 2021-10-22 NOTE — Progress Notes (Signed)
Referring Provider Reynold Bowen, MD [address] [place],  [place] [address]   CC:  Chief Complaint  Patient presents with   Follow-up    N/C consult      Lauren Bolton is an 61 y.o. female.  HPI: Ms. Harkins returns today for reevaluation and to resubmission for  bilateral upper lid blepharoplasty.  Change in her condition or her request.  Allergies  Allergen Reactions   Benadryl [Diphenhydramine Hcl] Diarrhea and Other (See Comments)    Reaction:  Makes her excessively sleepy    Alprazolam Diarrhea   Lactose Intolerance (Gi)     Outpatient Encounter Medications as of 10/22/2021  Medication Sig Note   Carboxymethylcellul-Glycerin (CLEAR EYES FOR DRY EYES OP) Place 1 drop into both eyes daily as needed (dry eyes).    Cholecalciferol (VITAMIN D PO) Take 1 tablet by mouth daily.    docusate sodium (COLACE) 100 MG capsule Take 100 mg by mouth daily.    FIBER ADULT GUMMIES PO Take 1 tablet by mouth daily.    ibuprofen (ADVIL,MOTRIN) 200 MG tablet Take 200-400 mg by mouth daily as needed for moderate pain.    iron polysaccharides (NIFEREX) 150 MG capsule Take 1 capsule (150 mg total) by mouth 2 (two) times daily.    levothyroxine (SYNTHROID) 100 MCG tablet Take 1 tablet (100 mcg total) by mouth daily before breakfast. 06/14/2017: 100 mcg Mon-Thurs, 50 mcg on Fridays, none on Sat or Sun   LORazepam (ATIVAN) 0.5 MG tablet Take 0.5 tablets (0.25 mg total) by mouth 2 (two) times daily as needed for anxiety.    Omega-3 Fatty Acids (FISH OIL) 1000 MG CAPS Take 1 capsule by mouth.    ondansetron (ZOFRAN-ODT) 4 MG disintegrating tablet Take 1 tablet (4 mg total) by mouth every 8 (eight) hours as needed for nausea or vomiting.    No facility-administered encounter medications on file as of 10/22/2021.     Past Medical History:  Diagnosis Date   Anxiety    Depression    Iron deficiency anemia    Thyroid disease     Past Surgical History:  Procedure Laterality Date    ESOPHAGOGASTRODUODENOSCOPY N/A 11/13/2015   Procedure: ESOPHAGOGASTRODUODENOSCOPY (EGD);  Surgeon: Teena Irani, MD;  Location: [facility];  Service: Endoscopy;  Laterality: N/A;   ESOPHAGOGASTRODUODENOSCOPY (EGD) WITH PROPOFOL N/A 12/26/2015   Procedure: ESOPHAGOGASTRODUODENOSCOPY (EGD) WITH PROPOFOL;  Surgeon: Ronald Lobo, MD;  Location: [facility];  Service: Endoscopy;  Laterality: N/A;   PLACEMENT OF BREAST IMPLANTS     20 years ago   thyroid goiter      Family History  Problem Relation Age of Onset   Hypertension Mother    Thyroid disease Neg Hx     Social History   Social History Narrative   Not on file     Review of Systems General: Denies fevers, chills, weight loss CV: Denies chest pain, shortness of breath, palpitations Vision: Peripheral vision obstructed by excess skin on the upper eyelids  Physical Exam    06/05/2021    2:55 PM 01/01/2021    4:14 PM 02/03/2018    2:56 PM  Vitals with BMI  Height  5\' 0"    Weight  104 lbs 3 oz   BMI  [id number]   Systolic  [id number]   Diastolic  78   Pulse  77      Information is confidential and restricted. Go to Review Flowsheets to unlock data.    General:  No acute distress,  Alert and  oriented, Non-Toxic, Normal speech and affect Eyes: Excess skin on the upper eyelids especially laterally. Visual field examination: The patient underwent visual field testing at [facility].  The results showed a 10 degree central vertical field increase with taping and a 20 degree horizontal field increase with taping.  Assessment/Plan Dermatochalasis: The patient was previously approved for surgery through her insurance company however the surgery was not scheduled within the defined period of time.  She needs to have resubmission and approval for surgery again at which time we will proceed with bilateral upper lid excess skin excision.  Lauren Bolton 10/22/2021, 5:25 PM

## 2021-10-29 DIAGNOSIS — F411 Generalized anxiety disorder: Secondary | ICD-10-CM | POA: Diagnosis not present

## 2021-10-29 DIAGNOSIS — R7301 Impaired fasting glucose: Secondary | ICD-10-CM | POA: Diagnosis not present

## 2021-10-29 DIAGNOSIS — E785 Hyperlipidemia, unspecified: Secondary | ICD-10-CM | POA: Diagnosis not present

## 2021-10-29 DIAGNOSIS — M810 Age-related osteoporosis without current pathological fracture: Secondary | ICD-10-CM | POA: Diagnosis not present

## 2021-11-05 DIAGNOSIS — D649 Anemia, unspecified: Secondary | ICD-10-CM | POA: Diagnosis not present

## 2021-11-05 DIAGNOSIS — R7301 Impaired fasting glucose: Secondary | ICD-10-CM | POA: Diagnosis not present

## 2021-11-05 DIAGNOSIS — Z0000 Encounter for general adult medical examination without abnormal findings: Secondary | ICD-10-CM | POA: Diagnosis not present

## 2021-11-05 DIAGNOSIS — E890 Postprocedural hypothyroidism: Secondary | ICD-10-CM | POA: Diagnosis not present

## 2021-11-20 ENCOUNTER — Telehealth (HOSPITAL_BASED_OUTPATIENT_CLINIC_OR_DEPARTMENT_OTHER): Payer: Medicare Other | Admitting: Psychiatry

## 2021-11-20 DIAGNOSIS — F330 Major depressive disorder, recurrent, mild: Secondary | ICD-10-CM

## 2021-11-20 DIAGNOSIS — F411 Generalized anxiety disorder: Secondary | ICD-10-CM | POA: Diagnosis not present

## 2021-11-20 MED ORDER — LORAZEPAM 0.5 MG PO TABS: 0.2500 mg | ORAL_TABLET | Freq: Every evening | ORAL | 1 refills | Status: DC | PRN

## 2021-11-20 NOTE — Progress Notes (Signed)
Virtual Visit via Phone Note  I connected with Lauren Bolton on 11/20/21 at  4:00 PM EST by phone. She does not have the ability to do a video enabled telemedicine application. I verified that I am speaking with the correct person using two identifiers.  Location: Patient: home Provider: office   I discussed the limitations of evaluation and management by telemedicine and the availability of in person appointments. The patient expressed understanding and agreed to proceed.  History of Present Illness: Lauren Bolton shares she is doing well. She still struggles with anxiety on a daily basis. She is taking 1/2 tab of Ativan twice a day to calm her. It is not very effective so she tries other things to distract herself. Lauren Bolton denies depression. Her sleep is variable and some nights she feels well. She denies napping. She denies SI/HI.    Observations/Objective: Psychiatric Specialty Exam:  General Appearance: unable to assess  Eye Contact:  unable to assess  Speech:  Clear and Coherent and Slow  Volume:  Normal  Mood:  Anxious  Affect:  Full Range  Thought Process:  Goal Directed, Linear, and Descriptions of Associations: Intact  Orientation:  Full (Time, Place, and Person)  Thought Content:  Logical  Suicidal Thoughts:  No  Homicidal Thoughts:  No  Memory:  Immediate;   Good  Judgement:  Good  Insight:  Good  Psychomotor Activity: unable to assess  Concentration:  Concentration: Good  Recall:  Good  Fund of Knowledge:  Good  Language:  Fair  Akathisia:  unable to assess  Handed:  unable to assess  AIMS (if indicated):     Assets:  Communication Skills Desire for Improvement Financial Resources/Insurance Housing Intimacy Leisure Time Resilience Social Support Talents/Skills Vocational/Educational  ADL's:  unable to assess  Cognition:  WNL  Sleep:         Assessment and Plan:     11/20/2021    4:12 PM 09/25/2021    3:12 PM 07/03/2021    3:08 PM 04/03/2021    4:07 PM  01/16/2021    4:09 PM  Depression screen PHQ 2/9  Decreased Interest 0 0 0 0 0  Down, Depressed, Hopeless 0 0 0 2 1  PHQ - 2 Score 0 0 0 2 1  Altered sleeping    2   Tired, decreased energy    1   Change in appetite    3   Feeling bad or failure about yourself     2   Trouble concentrating    0   Moving slowly or fidgety/restless    0   Suicidal thoughts    0   PHQ-9 Score    10   Difficult doing work/chores    Very difficult     Flowsheet Row Video Visit [facility]/20[facility]2021 in [facility] Video Visit from 07/03/2021 in [facility] Video Visit from 04/03/2021 in [facility]  C-SSRS RISK CATEGORY No Risk No Risk No Risk        Pt is aware that these meds carry a teratogenic risk. Pt will discuss plan of action if she does or plans to become pregnant in the future.  Status of current problems: ongoing issues with anxiety  Meds: decrease Ativan 0.25mg  po qD prn anxiety. The goal is to minimize and eventually stop Ativan. She does not want to take any other meds and we have discussed options extensively over the years. I believe she can return  to her PCP for management of her anxiety at this time.  1. GAD (generalized anxiety disorder) - LORazepam (ATIVAN) 0.5 MG tablet; Take 0.5 tablets (0.25 mg total) by mouth at bedtime as needed for anxiety.  Dispense: 15 tablet; Refill: 1  2. MDD (major depressive disorder), recurrent episode, mild ([facility])     Labs: none    Therapy: brief supportive therapy provided.     Collaboration of Care: Other none  Patient/Guardian was advised Release of Information must be obtained prior to any record release in order to collaborate their care with an outside provider. Patient/Guardian was advised if they have not already done so to contact the registration department to sign all necessary forms in order for [place] to release information  regarding their care.   Consent: Patient/Guardian gives verbal consent for treatment and assignment of benefits for services provided during this visit. Patient/Guardian expressed understanding and agreed to proceed.      Follow Up Instructions: Follow up with her PCP and with me PRN.      I discussed the assessment and treatment plan with the patient. The patient was provided an opportunity to ask questions and all were answered. The patient agreed with the plan and demonstrated an understanding of the instructions.   The patient was advised to call back or seek an in-person evaluation if the symptoms worsen or if the condition fails to improve as anticipated.  I provided 11 minutes of non-face-to-face time during this encounter.   Oletta Darter, MD

## 2021-12-03 ENCOUNTER — Telehealth: Payer: Self-pay | Admitting: Plastic Surgery

## 2021-12-03 NOTE — Telephone Encounter (Signed)
Husband called and would like an updated on insurance authorization since his wife saw Ladona Ridgel to restart the auth process.

## 2021-12-19 ENCOUNTER — Telehealth: Payer: Self-pay | Admitting: *Deleted

## 2021-12-19 NOTE — Telephone Encounter (Signed)
New auth submitted, clinicals faxed, color photos emailed per request.  Fax: [phone] Email: [email or web address]

## 2021-12-19 NOTE — Telephone Encounter (Signed)
This message was sent via FAXCOM, a product from [employer]. [email or web address].[email or web address]/                    -------Fax Transmission Report-------  To:               Recipient at [phone] Subject:          [secure] Requested clinicals  - [id number] DOB Oct 23, 2060 / [place] [id number] Result:           The transmission was successful. Explanation:      All Pages Ok Pages Sent:       18 Connect Time:     7 minutes, 40 seconds Transmit Time:    12/19/2021 09:51 Transfer Rate:    14400 Status Code:      0000 Retry Count:      1 Job Id:           [id number] Unique Id:        [id number] Fax Line:         36 Fax Server:       [employer]

## 2022-01-06 ENCOUNTER — Telehealth: Payer: Self-pay | Admitting: *Deleted

## 2022-01-06 NOTE — Telephone Encounter (Signed)
Called patient back, spouse answered and asked that I call them back around 230 today

## 2022-01-06 NOTE — Telephone Encounter (Signed)
Spoke with spouse and patient to schedule sx and related appointments

## 2022-01-07 DIAGNOSIS — K080 Exfoliation of teeth due to systemic causes: Secondary | ICD-10-CM | POA: Diagnosis not present

## 2022-01-14 ENCOUNTER — Encounter (HOSPITAL_BASED_OUTPATIENT_CLINIC_OR_DEPARTMENT_OTHER): Payer: Self-pay | Admitting: Plastic Surgery

## 2022-01-14 ENCOUNTER — Other Ambulatory Visit: Payer: Self-pay

## 2022-01-14 ENCOUNTER — Encounter: Payer: Self-pay | Admitting: Physician Assistant

## 2022-01-14 ENCOUNTER — Ambulatory Visit (INDEPENDENT_AMBULATORY_CARE_PROVIDER_SITE_OTHER): Payer: Medicare Other | Admitting: Physician Assistant

## 2022-01-14 VITALS — BP 177/84 | HR 74 | Ht 60.0 in | Wt 100.2 lb

## 2022-01-14 DIAGNOSIS — H02834 Dermatochalasis of left upper eyelid: Secondary | ICD-10-CM

## 2022-01-14 DIAGNOSIS — H02831 Dermatochalasis of right upper eyelid: Secondary | ICD-10-CM

## 2022-01-14 MED ORDER — ONDANSETRON 4 MG PO TBDP: 4.0000 mg | ORAL_TABLET | Freq: Three times a day (TID) | ORAL | 0 refills | Status: DC | PRN

## 2022-01-14 MED ORDER — OXYCODONE HCL 5 MG PO TABS: 5.0000 mg | ORAL_TABLET | Freq: Three times a day (TID) | ORAL | 0 refills | Status: AC | PRN

## 2022-01-14 NOTE — Progress Notes (Signed)
Patient ID: Lauren Bolton, female    DOB: [birth year], 62 y.o.   MRN: [id number]  Chief Complaint  Patient presents with   Pre-op Exam      ICD-10-CM   1. Dermatochalasis of both upper eyelids  H02.831    H02.834        History of Present Illness: Lauren Bolton is a 62 y.o.  female  with a history of dermatochalasis.  She presents for preoperative evaluation for upcoming procedure, bilateral upper blepharoplasty, scheduled for 01/20/2022 with Dr.  Lovena Le .  The patient has not had problems with anesthesia aside from some mild PONV.  She is accompanied by her husband as well as an interpreter at bedside.  Thyroid disorder with previous thyroidectomy, well-controlled on Synthroid.  She understands to hold multivitamins and supplements x 1 week prior to surgery.  Denies any personal or family history of blood clots or clotting disorder, cancer, or any severe cardiac/pulmonary disease.  She does not smoke or use nicotine-containing products.  She states that she sees Dr. Reynold Bowen for primary care and that his iron deficiency anemia is controlled on iron replacement.  She states that her blood pressures are typically well-controlled 409W or 119J systolic, but she has been taking DayQuil for URI symptoms and also felt nervous today for her preoperative examination.  Summary of Previous Visit: She was seen for initial consult by Dr. Claudia Desanctis and then subsequently by Dr. Erin Hearing and most recently Dr. Lovena Le.  She has obtained visual field testing through [facility] given her symptomatic dermatochalasis.  She is bothered by persistent heaviness to her upper lids in addition to the visual impairment.  Job: N/A.  PMH Significant for: Bilateral upper lid dermatochalasis, GAD on benzodiazepines, MDD, thyroid disorder, iron deficiency anemia with most recent hemoglobin in chart 06/2017 WNL.     Past Medical History: Allergies: Allergies  Allergen Reactions   Benadryl  [Diphenhydramine Hcl] Diarrhea and Other (See Comments)    Reaction:  Makes her excessively sleepy    Alprazolam Diarrhea   Lactose Intolerance (Gi)     Current Medications:  Current Outpatient Medications:    Carboxymethylcellul-Glycerin (CLEAR EYES FOR DRY EYES OP), Place 1 drop into both eyes daily as needed (dry eyes)., Disp: , Rfl:    Cholecalciferol (VITAMIN D PO), Take 1 tablet by mouth daily., Disp: , Rfl:    FIBER ADULT GUMMIES PO, Take 1 tablet by mouth daily., Disp: , Rfl:    ibuprofen (ADVIL,MOTRIN) 200 MG tablet, Take 200-400 mg by mouth daily as needed for moderate pain., Disp: , Rfl:    iron polysaccharides (NIFEREX) 150 MG capsule, Take 1 capsule (150 mg total) by mouth 2 (two) times daily., Disp: 60 capsule, Rfl: 0   levothyroxine (SYNTHROID) 100 MCG tablet, Take 1 tablet (100 mcg total) by mouth daily before breakfast., Disp: 30 tablet, Rfl: 0   LORazepam (ATIVAN) 0.5 MG tablet, Take 0.5 tablets (0.25 mg total) by mouth at bedtime as needed for anxiety., Disp: 15 tablet, Rfl: 1   Omega-3 Fatty Acids (FISH OIL) 1000 MG CAPS, Take 1 capsule by mouth., Disp: , Rfl:    docusate sodium (COLACE) 100 MG capsule, Take 100 mg by mouth daily. (Patient not taking: Reported on 11/20/2021), Disp: , Rfl:    ondansetron (ZOFRAN-ODT) 4 MG disintegrating tablet, Take 1 tablet (4 mg total) by mouth every 8 (eight) hours as needed for nausea or vomiting. (Patient not taking: Reported on 11/20/2021), Disp: 8 tablet, Rfl: 0  Past Medical Problems: Past Medical History:  Diagnosis Date   Anxiety    Depression    Iron deficiency anemia    Thyroid disease     Past Surgical History: Past Surgical History:  Procedure Laterality Date   ESOPHAGOGASTRODUODENOSCOPY N/A 11/13/2015   Procedure: ESOPHAGOGASTRODUODENOSCOPY (EGD);  Surgeon: Teena Irani, MD;  Location: [facility];  Service: Endoscopy;  Laterality: N/A;   ESOPHAGOGASTRODUODENOSCOPY (EGD) WITH PROPOFOL N/A 12/26/2015   Procedure:  ESOPHAGOGASTRODUODENOSCOPY (EGD) WITH PROPOFOL;  Surgeon: Ronald Lobo, MD;  Location: [facility] ENDOSCOPY;  Service: Endoscopy;  Laterality: N/A;   PLACEMENT OF BREAST IMPLANTS     20 years ago   thyroid goiter      Social History: Social History   Socioeconomic History   Marital status: Married    Spouse name: Not on file   Number of children: Not on file   Years of education: Not on file   Highest education level: Not on file  Occupational History   Not on file  Tobacco Use   Smoking status: Never   Smokeless tobacco: Never  Vaping Use   Vaping Use: Never used  Substance and Sexual Activity   Alcohol use: No   Drug use: No   Sexual activity: Not Currently  Other Topics Concern   Not on file  Social History Narrative   Not on file   Social Determinants of Health   Financial Resource Strain: Not on file  Food Insecurity: Not on file  Transportation Needs: Not on file  Physical Activity: Not on file  Stress: Not on file  Social Connections: Not on file  Intimate Partner Violence: Not on file    Family History: Family History  Problem Relation Age of Onset   Hypertension Mother    Thyroid disease Neg Hx     Review of Systems: ROS Denies any recent chest pain, difficulty breathing, leg swelling, or fevers.  Physical Exam: Vital Signs BP (!) 177/84 (BP Location: Left Arm, Patient Position: Sitting, Cuff Size: Normal)   Pulse 74   Ht 5' (1.524 m)   Wt 100 lb 3.2 oz (45.5 kg)   SpO2 98%   BMI 19.57 kg/m   Physical Exam  Constitutional:      General: Not in acute distress.    Appearance: Normal appearance. Not ill-appearing.  HENT:     Head: Normocephalic and atraumatic.  Eyes:     Pupils: Pupils are equal, round. Cardiovascular:     Rate and Rhythm: Normal rate.    Pulses: Normal pulses.  Pulmonary:     Effort: No respiratory distress or increased work of breathing.  Speaks in full sentences. Abdominal:     General: Abdomen is flat. No distension.    Musculoskeletal: Normal range of motion. No lower extremity swelling or edema. No varicosities.  Skin:    General: Skin is warm and dry.     Findings: No erythema or rash.  Neurological:     Mental Status: Alert and oriented to person, place, and time.  Psychiatric:        Mood and Affect: Mood normal.        Behavior: Behavior normal.    Assessment/Plan: The patient is scheduled for bilateral upper blepharoplasty with Dr.  Lovena Le .  Risks, benefits, and alternatives of procedure discussed, questions answered and consent obtained.    Smoking Status: Non-smoker.  Caprini Score: 4; Risk Factors include: Age and length of planned surgery. Recommendation for mechanical prophylaxis. Encourage early ambulation.   Pictures obtained:  01/01/2021  Post-op Rx sent to pharmacy: Oxycodone and Zofran.  Patient was provided with the General Surgical Risk consent document and Pain Medication Agreement prior to their appointment.  They had adequate time to read through the risk consent documents and Pain Medication Agreement. We also discussed them in person together during this preop appointment. All of their questions were answered to their satisfaction.  Recommended calling if they have any further questions.  Risk consent form and Pain Medication Agreement to be scanned into patient's chart.  The risks that can be encountered with and after a blepharoplasty were discussed and include the following but no limited to these:  Asymmetry, dry eyes, lid lag, sensitivity to sun or bright light, difficulty closing your eyes, outward rolling of the eyelid, change in vision, fluid accumulation, firmness of the area, fat necrosis with death of fat tissue, bleeding, infection, delayed healing, anesthesia risks, skin sensation changes, injury to structures including nerves, blood vessels, and muscles which may be temporary or permanent, hair loss, allergies to tape, suture materials and glues, blood products,  topical preparations or injected agents, skin and contour irregularities, skin discoloration and swelling, deep vein thrombosis, cardiac and pulmonary complications, pain, which may persist, persistent pain, recurrence, poor healing of the incision, possible need for revisional surgery or staged procedures. Thiere can also be persistent swelling, poor wound healing, rippling or loose skin, swelling. Any change in weight fluctuations can alter the outcome.   Electronically signed by: Evelena Leyden, PA-C 01/14/2022 11:07 AM

## 2022-01-14 NOTE — H&P (View-Only) (Signed)
Patient ID: Geneive Sandstrom, female    DOB: [birth year], 62 y.o.   MRN: [id number]  Chief Complaint  Patient presents with   Pre-op Exam      ICD-10-CM   1. Dermatochalasis of both upper eyelids  H02.831    H02.834        History of Present Illness: Marla Turton is a 62 y.o.  female  with a history of dermatochalasis.  She presents for preoperative evaluation for upcoming procedure, bilateral upper blepharoplasty, scheduled for 01/20/2022 with Dr.  Lovena Le .  The patient has not had problems with anesthesia aside from some mild PONV.  She is accompanied by her husband as well as an interpreter at bedside.  Thyroid disorder with previous thyroidectomy, well-controlled on Synthroid.  She understands to hold multivitamins and supplements x 1 week prior to surgery.  Denies any personal or family history of blood clots or clotting disorder, cancer, or any severe cardiac/pulmonary disease.  She does not smoke or use nicotine-containing products.  She states that she sees Dr. Reynold Bowen for primary care and that his iron deficiency anemia is controlled on iron replacement.  She states that her blood pressures are typically well-controlled 409W or 119J systolic, but she has been taking DayQuil for URI symptoms and also felt nervous today for her preoperative examination.  Summary of Previous Visit: She was seen for initial consult by Dr. Claudia Desanctis and then subsequently by Dr. Erin Hearing and most recently Dr. Lovena Le.  She has obtained visual field testing through [facility] given her symptomatic dermatochalasis.  She is bothered by persistent heaviness to her upper lids in addition to the visual impairment.  Job: N/A.  PMH Significant for: Bilateral upper lid dermatochalasis, GAD on benzodiazepines, MDD, thyroid disorder, iron deficiency anemia with most recent hemoglobin in chart 06/2017 WNL.     Past Medical History: Allergies: Allergies  Allergen Reactions   Benadryl  [Diphenhydramine Hcl] Diarrhea and Other (See Comments)    Reaction:  Makes her excessively sleepy    Alprazolam Diarrhea   Lactose Intolerance (Gi)     Current Medications:  Current Outpatient Medications:    Carboxymethylcellul-Glycerin (CLEAR EYES FOR DRY EYES OP), Place 1 drop into both eyes daily as needed (dry eyes)., Disp: , Rfl:    Cholecalciferol (VITAMIN D PO), Take 1 tablet by mouth daily., Disp: , Rfl:    FIBER ADULT GUMMIES PO, Take 1 tablet by mouth daily., Disp: , Rfl:    ibuprofen (ADVIL,MOTRIN) 200 MG tablet, Take 200-400 mg by mouth daily as needed for moderate pain., Disp: , Rfl:    iron polysaccharides (NIFEREX) 150 MG capsule, Take 1 capsule (150 mg total) by mouth 2 (two) times daily., Disp: 60 capsule, Rfl: 0   levothyroxine (SYNTHROID) 100 MCG tablet, Take 1 tablet (100 mcg total) by mouth daily before breakfast., Disp: 30 tablet, Rfl: 0   LORazepam (ATIVAN) 0.5 MG tablet, Take 0.5 tablets (0.25 mg total) by mouth at bedtime as needed for anxiety., Disp: 15 tablet, Rfl: 1   Omega-3 Fatty Acids (FISH OIL) 1000 MG CAPS, Take 1 capsule by mouth., Disp: , Rfl:    docusate sodium (COLACE) 100 MG capsule, Take 100 mg by mouth daily. (Patient not taking: Reported on 11/20/2021), Disp: , Rfl:    ondansetron (ZOFRAN-ODT) 4 MG disintegrating tablet, Take 1 tablet (4 mg total) by mouth every 8 (eight) hours as needed for nausea or vomiting. (Patient not taking: Reported on 11/20/2021), Disp: 8 tablet, Rfl: 0  Past Medical Problems: Past Medical History:  Diagnosis Date   Anxiety    Depression    Iron deficiency anemia    Thyroid disease     Past Surgical History: Past Surgical History:  Procedure Laterality Date   ESOPHAGOGASTRODUODENOSCOPY N/A 11/13/2015   Procedure: ESOPHAGOGASTRODUODENOSCOPY (EGD);  Surgeon: Teena Irani, MD;  Location: [facility];  Service: Endoscopy;  Laterality: N/A;   ESOPHAGOGASTRODUODENOSCOPY (EGD) WITH PROPOFOL N/A 12/26/2015   Procedure:  ESOPHAGOGASTRODUODENOSCOPY (EGD) WITH PROPOFOL;  Surgeon: Ronald Lobo, MD;  Location: [facility] ENDOSCOPY;  Service: Endoscopy;  Laterality: N/A;   PLACEMENT OF BREAST IMPLANTS     20 years ago   thyroid goiter      Social History: Social History   Socioeconomic History   Marital status: Married    Spouse name: Not on file   Number of children: Not on file   Years of education: Not on file   Highest education level: Not on file  Occupational History   Not on file  Tobacco Use   Smoking status: Never   Smokeless tobacco: Never  Vaping Use   Vaping Use: Never used  Substance and Sexual Activity   Alcohol use: No   Drug use: No   Sexual activity: Not Currently  Other Topics Concern   Not on file  Social History Narrative   Not on file   Social Determinants of Health   Financial Resource Strain: Not on file  Food Insecurity: Not on file  Transportation Needs: Not on file  Physical Activity: Not on file  Stress: Not on file  Social Connections: Not on file  Intimate Partner Violence: Not on file    Family History: Family History  Problem Relation Age of Onset   Hypertension Mother    Thyroid disease Neg Hx     Review of Systems: ROS Denies any recent chest pain, difficulty breathing, leg swelling, or fevers.  Physical Exam: Vital Signs BP (!) 177/84 (BP Location: Left Arm, Patient Position: Sitting, Cuff Size: Normal)   Pulse 74   Ht 5' (1.524 m)   Wt 100 lb 3.2 oz (45.5 kg)   SpO2 98%   BMI 19.57 kg/m   Physical Exam  Constitutional:      General: Not in acute distress.    Appearance: Normal appearance. Not ill-appearing.  HENT:     Head: Normocephalic and atraumatic.  Eyes:     Pupils: Pupils are equal, round. Cardiovascular:     Rate and Rhythm: Normal rate.    Pulses: Normal pulses.  Pulmonary:     Effort: No respiratory distress or increased work of breathing.  Speaks in full sentences. Abdominal:     General: Abdomen is flat. No distension.    Musculoskeletal: Normal range of motion. No lower extremity swelling or edema. No varicosities.  Skin:    General: Skin is warm and dry.     Findings: No erythema or rash.  Neurological:     Mental Status: Alert and oriented to person, place, and time.  Psychiatric:        Mood and Affect: Mood normal.        Behavior: Behavior normal.    Assessment/Plan: The patient is scheduled for bilateral upper blepharoplasty with Dr.  Lovena Le .  Risks, benefits, and alternatives of procedure discussed, questions answered and consent obtained.    Smoking Status: Non-smoker.  Caprini Score: 4; Risk Factors include: Age and length of planned surgery. Recommendation for mechanical prophylaxis. Encourage early ambulation.   Pictures obtained:  01/01/2021  Post-op Rx sent to pharmacy: Oxycodone and Zofran.  Patient was provided with the General Surgical Risk consent document and Pain Medication Agreement prior to their appointment.  They had adequate time to read through the risk consent documents and Pain Medication Agreement. We also discussed them in person together during this preop appointment. All of their questions were answered to their satisfaction.  Recommended calling if they have any further questions.  Risk consent form and Pain Medication Agreement to be scanned into patient's chart.  The risks that can be encountered with and after a blepharoplasty were discussed and include the following but no limited to these:  Asymmetry, dry eyes, lid lag, sensitivity to sun or bright light, difficulty closing your eyes, outward rolling of the eyelid, change in vision, fluid accumulation, firmness of the area, fat necrosis with death of fat tissue, bleeding, infection, delayed healing, anesthesia risks, skin sensation changes, injury to structures including nerves, blood vessels, and muscles which may be temporary or permanent, hair loss, allergies to tape, suture materials and glues, blood products,  topical preparations or injected agents, skin and contour irregularities, skin discoloration and swelling, deep vein thrombosis, cardiac and pulmonary complications, pain, which may persist, persistent pain, recurrence, poor healing of the incision, possible need for revisional surgery or staged procedures. Thiere can also be persistent swelling, poor wound healing, rippling or loose skin, swelling. Any change in weight fluctuations can alter the outcome.   Electronically signed by: Ouida Abeyta, PA-C 01/14/2022 11:07 AM 

## 2022-01-19 ENCOUNTER — Encounter (HOSPITAL_COMMUNITY): Payer: Self-pay | Admitting: Certified Registered"

## 2022-01-19 ENCOUNTER — Telehealth: Payer: Self-pay | Admitting: *Deleted

## 2022-01-19 DIAGNOSIS — E785 Hyperlipidemia, unspecified: Secondary | ICD-10-CM | POA: Diagnosis not present

## 2022-01-19 NOTE — Telephone Encounter (Signed)
Patients husband called today to let us know patient tested positive for Flu. Sx and related appts have been cancelled. Instructed them to call us when she is feeling better so we can reschedule.

## 2022-01-20 ENCOUNTER — Ambulatory Visit (HOSPITAL_BASED_OUTPATIENT_CLINIC_OR_DEPARTMENT_OTHER): Admission: RE | Admit: 2022-01-20 | Payer: Medicare Other | Source: Ambulatory Visit | Admitting: Plastic Surgery

## 2022-01-20 DIAGNOSIS — Z01818 Encounter for other preprocedural examination: Secondary | ICD-10-CM

## 2022-01-20 HISTORY — DX: Hypothyroidism, unspecified: E03.9

## 2022-01-20 SURGERY — BLEPHAROPLASTY
Anesthesia: General | Site: Eye | Laterality: Bilateral

## 2022-01-28 ENCOUNTER — Encounter: Payer: Medicare Other | Admitting: Plastic Surgery

## 2022-01-28 ENCOUNTER — Telehealth: Payer: Self-pay | Admitting: *Deleted

## 2022-01-28 ENCOUNTER — Encounter: Payer: Medicare Other | Admitting: Physician Assistant

## 2022-01-28 ENCOUNTER — Encounter (HOSPITAL_BASED_OUTPATIENT_CLINIC_OR_DEPARTMENT_OTHER): Payer: Self-pay | Admitting: Plastic Surgery

## 2022-01-28 NOTE — Telephone Encounter (Signed)
Spoke with spouse to reschedule surgery to 02/03/22 @ 1230 and all post ops on Wednesdays as requested

## 2022-02-03 ENCOUNTER — Ambulatory Visit (HOSPITAL_BASED_OUTPATIENT_CLINIC_OR_DEPARTMENT_OTHER): Payer: Medicare Other | Admitting: Anesthesiology

## 2022-02-03 ENCOUNTER — Encounter (HOSPITAL_BASED_OUTPATIENT_CLINIC_OR_DEPARTMENT_OTHER): Payer: Self-pay | Admitting: Plastic Surgery

## 2022-02-03 ENCOUNTER — Other Ambulatory Visit: Payer: Self-pay

## 2022-02-03 ENCOUNTER — Encounter (HOSPITAL_BASED_OUTPATIENT_CLINIC_OR_DEPARTMENT_OTHER)
Admission: RE | Disposition: A | Discharge status: Home / Self Care | Payer: Self-pay | Source: Ambulatory Visit | Attending: Plastic Surgery

## 2022-02-03 ENCOUNTER — Ambulatory Visit (HOSPITAL_BASED_OUTPATIENT_CLINIC_OR_DEPARTMENT_OTHER)
Admission: RE | Admit: 2022-02-03 | Discharge: 2022-02-03 | Disposition: A | Discharge status: Home / Self Care | Payer: Medicare Other | Source: Ambulatory Visit | Attending: Plastic Surgery | Admitting: Plastic Surgery

## 2022-02-03 DIAGNOSIS — Z79890 Hormone replacement therapy (postmenopausal): Secondary | ICD-10-CM | POA: Diagnosis not present

## 2022-02-03 DIAGNOSIS — H02834 Dermatochalasis of left upper eyelid: Secondary | ICD-10-CM

## 2022-02-03 DIAGNOSIS — E039 Hypothyroidism, unspecified: Secondary | ICD-10-CM | POA: Diagnosis not present

## 2022-02-03 DIAGNOSIS — E46 Unspecified protein-calorie malnutrition: Secondary | ICD-10-CM

## 2022-02-03 DIAGNOSIS — H02831 Dermatochalasis of right upper eyelid: Secondary | ICD-10-CM | POA: Insufficient documentation

## 2022-02-03 DIAGNOSIS — F419 Anxiety disorder, unspecified: Secondary | ICD-10-CM | POA: Diagnosis not present

## 2022-02-03 DIAGNOSIS — Z79899 Other long term (current) drug therapy: Secondary | ICD-10-CM | POA: Diagnosis not present

## 2022-02-03 HISTORY — PX: BROW LIFT: SHX178

## 2022-02-03 SURGERY — BLEPHAROPLASTY
Anesthesia: General | Site: Eye | Laterality: Bilateral

## 2022-02-03 MED ORDER — FENTANYL CITRATE (PF) 100 MCG/2ML IJ SOLN
INTRAMUSCULAR | Status: DC | PRN
  Administered 2022-02-03: 50 ug via INTRAVENOUS
  Administered 2022-02-03 (×2): 25 ug via INTRAVENOUS

## 2022-02-03 MED ORDER — PHENYLEPHRINE HCL (PRESSORS) 10 MG/ML IV SOLN
INTRAVENOUS | Status: DC | PRN
  Administered 2022-02-03 (×3): 80 ug via INTRAVENOUS

## 2022-02-03 MED ORDER — MIDAZOLAM HCL 5 MG/5ML IJ SOLN
INTRAMUSCULAR | Status: DC | PRN
  Administered 2022-02-03: 2 mg via INTRAVENOUS

## 2022-02-03 MED ORDER — EPHEDRINE SULFATE (PRESSORS) 50 MG/ML IJ SOLN
INTRAMUSCULAR | Status: DC | PRN
  Administered 2022-02-03: 10 mg via INTRAVENOUS
  Administered 2022-02-03: 5 mg via INTRAVENOUS

## 2022-02-03 MED ORDER — BACITRACIN-POLYMYXIN B 500-10000 UNIT/GM OP OINT
TOPICAL_OINTMENT | OPHTHALMIC | Status: AC
  Filled 2022-02-03: qty 3.5

## 2022-02-03 MED ORDER — ACETAMINOPHEN 500 MG PO TABS
1000.0000 mg | ORAL_TABLET | Freq: Once | ORAL | Status: AC | PRN
  Administered 2022-02-03: 1000 mg via ORAL

## 2022-02-03 MED ORDER — LACTATED RINGERS IV SOLN: INTRAVENOUS | Status: DC

## 2022-02-03 MED ORDER — CHLORHEXIDINE GLUCONATE CLOTH 2 % EX PADS: 6.0000 | MEDICATED_PAD | Freq: Once | CUTANEOUS | Status: DC

## 2022-02-03 MED ORDER — CEFAZOLIN SODIUM-DEXTROSE 2-4 GM/100ML-% IV SOLN
2.0000 g | INTRAVENOUS | Status: AC
  Administered 2022-02-03: 2 g via INTRAVENOUS

## 2022-02-03 MED ORDER — BSS IO SOLN
INTRAOCULAR | Status: DC | PRN
  Administered 2022-02-03: 15 mL via INTRAOCULAR

## 2022-02-03 MED ORDER — FENTANYL CITRATE (PF) 100 MCG/2ML IJ SOLN: 25.0000 ug | INTRAMUSCULAR | Status: DC | PRN

## 2022-02-03 MED ORDER — MIDAZOLAM HCL 2 MG/2ML IJ SOLN
INTRAMUSCULAR | Status: AC
  Filled 2022-02-03: qty 2

## 2022-02-03 MED ORDER — ACETAMINOPHEN 160 MG/5ML PO SOLN: 1000.0000 mg | Freq: Once | ORAL | Status: AC | PRN

## 2022-02-03 MED ORDER — ACETAMINOPHEN 10 MG/ML IV SOLN: 1000.0000 mg | Freq: Once | INTRAVENOUS | Status: DC | PRN

## 2022-02-03 MED ORDER — DEXAMETHASONE SODIUM PHOSPHATE 4 MG/ML IJ SOLN
INTRAMUSCULAR | Status: DC | PRN
  Administered 2022-02-03: 5 mg via INTRAVENOUS

## 2022-02-03 MED ORDER — PROPOFOL 10 MG/ML IV BOLUS
INTRAVENOUS | Status: DC | PRN
  Administered 2022-02-03: 120 mg via INTRAVENOUS

## 2022-02-03 MED ORDER — DEXAMETHASONE SODIUM PHOSPHATE 10 MG/ML IJ SOLN
INTRAMUSCULAR | Status: AC
  Filled 2022-02-03: qty 1

## 2022-02-03 MED ORDER — ONDANSETRON HCL 4 MG/2ML IJ SOLN
INTRAMUSCULAR | Status: AC
  Filled 2022-02-03: qty 2

## 2022-02-03 MED ORDER — BACITRACIN-POLYMYXIN B 500-10000 UNIT/GM OP OINT
TOPICAL_OINTMENT | OPHTHALMIC | Status: DC | PRN
  Administered 2022-02-03: 1 via OPHTHALMIC

## 2022-02-03 MED ORDER — LIDOCAINE 2% (20 MG/ML) 5 ML SYRINGE
INTRAMUSCULAR | Status: AC
  Filled 2022-02-03: qty 5

## 2022-02-03 MED ORDER — BSS IO SOLN
INTRAOCULAR | Status: AC
  Filled 2022-02-03: qty 15

## 2022-02-03 MED ORDER — CEFAZOLIN SODIUM-DEXTROSE 2-4 GM/100ML-% IV SOLN
INTRAVENOUS | Status: AC
  Filled 2022-02-03: qty 100

## 2022-02-03 MED ORDER — LIDOCAINE-EPINEPHRINE 1 %-1:100000 IJ SOLN
INTRAMUSCULAR | Status: DC | PRN
  Administered 2022-02-03: .9 mL

## 2022-02-03 MED ORDER — BUPIVACAINE HCL (PF) 0.25 % IJ SOLN
INTRAMUSCULAR | Status: AC
  Filled 2022-02-03: qty 30

## 2022-02-03 MED ORDER — ACETAMINOPHEN 500 MG PO TABS
ORAL_TABLET | ORAL | Status: AC
  Filled 2022-02-03: qty 2

## 2022-02-03 MED ORDER — FENTANYL CITRATE (PF) 100 MCG/2ML IJ SOLN
INTRAMUSCULAR | Status: AC
  Filled 2022-02-03: qty 2

## 2022-02-03 MED ORDER — ONDANSETRON HCL 4 MG/2ML IJ SOLN
INTRAMUSCULAR | Status: DC | PRN
  Administered 2022-02-03: 4 mg via INTRAVENOUS

## 2022-02-03 SURGICAL SUPPLY — 58 items
ADH SKN CLS APL DERMABOND .7 (GAUZE/BANDAGES/DRESSINGS)
APL SRG 3 HI ABS STRL LF PLS (MISCELLANEOUS)
APL SWBSTK 6 STRL LF DISP (MISCELLANEOUS) ×4
APPLICATOR COTTON TIP 6 STRL (MISCELLANEOUS) ×4 IMPLANT
APPLICATOR COTTON TIP 6IN STRL (MISCELLANEOUS) ×4
APPLICATOR DR MATTHEWS STRL (MISCELLANEOUS) IMPLANT
BLADE SURG 15 STRL LF DISP TIS (BLADE) ×1 IMPLANT
BLADE SURG 15 STRL SS (BLADE) ×2
BNDG EYE OVAL 2 1/8 X 2 5/8 (GAUZE/BANDAGES/DRESSINGS) IMPLANT
CORD BIPOLAR FORCEPS 12FT (ELECTRODE) ×1 IMPLANT
COVER BACK TABLE 60X90IN (DRAPES) ×1 IMPLANT
COVER MAYO STAND STRL (DRAPES) ×1 IMPLANT
DERMABOND ADVANCED .7 DNX12 (GAUZE/BANDAGES/DRESSINGS) IMPLANT
DRAPE U-SHAPE 76X120 STRL (DRAPES) ×1 IMPLANT
ELECT NDL BLADE 2-5/6 (NEEDLE) ×1 IMPLANT
ELECT NEEDLE BLADE 2-5/6 (NEEDLE) ×1 IMPLANT
ELECT REM PT RETURN 9FT ADLT (ELECTROSURGICAL) ×1
ELECTRODE REM PT RTRN 9FT ADLT (ELECTROSURGICAL) ×1 IMPLANT
GLOVE BIO SURGEON STRL SZ 6.5 (GLOVE) IMPLANT
GLOVE BIO SURGEON STRL SZ8 (GLOVE) ×1 IMPLANT
GLOVE BIOGEL M STRL SZ7.5 (GLOVE) ×1 IMPLANT
GLOVE BIOGEL PI IND STRL 6.5 (GLOVE) IMPLANT
GLOVE BIOGEL PI IND STRL 7.0 (GLOVE) IMPLANT
GLOVE BIOGEL PI IND STRL 7.5 (GLOVE) ×1 IMPLANT
GLOVE BIOGEL PI IND STRL 8 (GLOVE) IMPLANT
GLOVE SURG SS PI 7.5 STRL IVOR (GLOVE) IMPLANT
GOWN STRL REUS W/ TWL LRG LVL3 (GOWN DISPOSABLE) ×2 IMPLANT
GOWN STRL REUS W/TWL LRG LVL3 (GOWN DISPOSABLE) ×2
GOWN STRL REUS W/TWL XL LVL3 (GOWN DISPOSABLE) IMPLANT
NDL BLUNT 17GA (NEEDLE) ×1 IMPLANT
NDL HYPO 27GX1-1/4 (NEEDLE) IMPLANT
NDL HYPO 30GX1 BEV (NEEDLE) IMPLANT
NDL HYPO 30X.5 LL (NEEDLE) IMPLANT
NEEDLE BLUNT 17GA (NEEDLE) ×1 IMPLANT
NEEDLE HYPO 27GX1-1/4 (NEEDLE) IMPLANT
NEEDLE HYPO 30GX1 BEV (NEEDLE) IMPLANT
NEEDLE HYPO 30X.5 LL (NEEDLE) ×1 IMPLANT
PACK BASIN DAY SURGERY FS (CUSTOM PROCEDURE TRAY) ×1 IMPLANT
PENCIL SMOKE EVACUATOR (MISCELLANEOUS) ×1 IMPLANT
SHEILD EYE MED CORNL SHD 22X21 (OPHTHALMIC RELATED) ×2
SHIELD EYE MED CORNL SHD 22X21 (OPHTHALMIC RELATED) ×2 IMPLANT
SLEEVE SCD COMPRESS KNEE MED (STOCKING) ×1 IMPLANT
SPIKE FLUID TRANSFER (MISCELLANEOUS) IMPLANT
STRIP CLOSURE SKIN 1/2X4 (GAUZE/BANDAGES/DRESSINGS) IMPLANT
STRIP SUTURE WOUND CLOSURE 1/2 (MISCELLANEOUS) IMPLANT
SUT CHROMIC 6 0 G 1 (SUTURE) IMPLANT
SUT MERSILENE 4-0 S-2 (SUTURE) IMPLANT
SUT MNCRL 6-0 UNDY P1 1X18 (SUTURE) ×2 IMPLANT
SUT MONOCRYL 6-0 P1 1X18 (SUTURE)
SUT PLAIN 5 0 P 3 18 (SUTURE) IMPLANT
SUT PROLENE 5 0 P 3 (SUTURE) ×1 IMPLANT
SUT PROLENE 6 0 P 1 18 (SUTURE) IMPLANT
SUT SILK 6 0 P 1 (SUTURE) IMPLANT
SYR 5ML LL (SYRINGE) ×1 IMPLANT
SYR CONTROL 10ML LL (SYRINGE) IMPLANT
SYR TB 1ML LL NO SAFETY (SYRINGE) IMPLANT
TOWEL GREEN STERILE FF (TOWEL DISPOSABLE) ×2 IMPLANT
TRAY DSU PREP LF (CUSTOM PROCEDURE TRAY) ×1 IMPLANT

## 2022-02-03 NOTE — Anesthesia Preprocedure Evaluation (Addendum)
Anesthesia Evaluation  Patient identified by MRN, date of birth, ID band Patient awake    Reviewed: Allergy & Precautions, NPO status , Patient's Chart, lab work & pertinent test results  History of Anesthesia Complications Negative for: history of anesthetic complications  Airway Mallampati: I  TM Distance: >3 FB Neck ROM: Full    Dental  (+) Teeth Intact, Dental Advisory Given   Pulmonary neg pulmonary ROS   breath sounds clear to auscultation       Cardiovascular negative cardio ROS  Rhythm:Regular     Neuro/Psych  PSYCHIATRIC DISORDERS Anxiety     negative neurological ROS     GI/Hepatic negative GI ROS, Neg liver ROS,,,  Endo/Other  Hypothyroidism    Renal/GU negative Renal ROS     Musculoskeletal negative musculoskeletal ROS (+)    Abdominal   Peds  Hematology negative hematology ROS (+) Lab Results      Component                Value               Date                      WBC                      4.7                 06/14/2017                HGB                      12.1                06/14/2017                HCT                      37.1                06/14/2017                MCV                      76.5 (L)            06/14/2017                PLT                      207                 06/14/2017              Anesthesia Other Findings   Reproductive/Obstetrics                              Anesthesia Physical Anesthesia Plan  ASA: 2  Anesthesia Plan: General   Post-op Pain Management: Minimal or no pain anticipated   Induction: Intravenous  PONV Risk Score and Plan: 3 and Ondansetron and Dexamethasone  Airway Management Planned: LMA  Additional Equipment: None  Intra-op Plan:   Post-operative Plan: Extubation in OR  Informed Consent: I have reviewed the patients History and Physical, chart, labs and discussed the procedure including the risks, benefits  and alternatives for the proposed anesthesia with the patient or  authorized representative who has indicated his/her understanding and acceptance.     Dental advisory given  Plan Discussed with: CRNA  Anesthesia Plan Comments:         Anesthesia Quick Evaluation

## 2022-02-03 NOTE — Discharge Instructions (Addendum)
1) Cold damp compression towel over eyelids every couple hours for first day, but no longer than 15 minutes at a time. 2) Elevate head of bed as much as possible for first 2-3 days to reduce swelling 3) Use Refresh PM ophthalmic ointment (or similar) in eyes as needed for dry eye symptoms. This can be obtained over-the-counter if needed.    Apply light pressure and gauze as needed for bleeding.  Apply ice pack gently this evening to help with the swelling.  Mild bleeding as well as moderate amount of bruising around the eyes is expected.    Call the office if you experience any severe bleeding or visual changes.  Expect mild blurriness with the ophthalmic ointment.      Post Anesthesia Home Care Instructions  Activity: Get plenty of rest for the remainder of the day. A responsible individual must stay with you for 24 hours following the procedure.  For the next 24 hours, DO NOT: -Drive a car -Paediatric nurse -Drink alcoholic beverages -Take any medication unless instructed by your physician -Make any legal decisions or sign important papers.  Meals: Start with liquid foods such as gelatin or soup. Progress to regular foods as tolerated. Avoid greasy, spicy, heavy foods. If nausea and/or vomiting occur, drink only clear liquids until the nausea and/or vomiting subsides. Call your physician if vomiting continues.  Special Instructions/Symptoms: Your throat may feel dry or sore from the anesthesia or the breathing tube placed in your throat during surgery. If this causes discomfort, gargle with warm salt water. The discomfort should disappear within 24 hours.  If you had a scopolamine patch placed behind your ear for the management of post- operative nausea and/or vomiting:  1. The medication in the patch is effective for 72 hours, after which it should be removed.  Wrap patch in a tissue and discard in the trash. Wash hands thoroughly with soap and water. 2. You may remove the patch  earlier than 72 hours if you experience unpleasant side effects which may include dry mouth, dizziness or visual disturbances. 3. Avoid touching the patch. Wash your hands with soap and water after contact with the patch.     No Tylenol til after 2:15 AM on 02/04/2022

## 2022-02-03 NOTE — Op Note (Signed)
DATE OF OPERATION: 02/03/2022  LOCATION: [facility] operating Room  PREOPERATIVE DIAGNOSIS: Dermatochalasis, bilateral  POSTOPERATIVE DIAGNOSIS: Same  PROCEDURE: Bilateral upper lid skin only blepharoplasties  SURGEON: Jeanann Lewandowsky, MD  ASSISTANT: Donnamarie Rossetti, primary, Krista Blue  EBL: 2 cc  CONDITION: Stable  COMPLICATIONS: None  INDICATION: The patient, Lauren Bolton, is a 62 y.o. female born on [birth year], is here for treatment peripheral visual field defects secondary to upper eyelid excess skin.   PROCEDURE DETAILS:  The patient was seen prior to surgery and marked.   IV antibiotics were given. The patient was taken to the operating room and given a general anesthetic. A standard time out was performed and all information was confirmed by those in the room. SCDs were placed.   The face and upper lids were prepped and draped in usual sterile manner.  Corneal protectors with Lacri-Lube were placed.  The skin to be excised was determined by pinch test to remove the max amount of upper lid skin without causing significant deficit and eye closing.  This amount was approximately 5 mm at widest point on the right and 5 mm at the widest point on the left.  The upper lid skin was infiltrated with 1% lidocaine with epinephrine.  The skin to be removed was incised with a 15 blade and removed with a Castro via halo scissor.  The left upper lid was addressed first.  Once the skin was excised the defect was closed with a running 6-0 chromic suture.  Sutures were placed to close the skin edges except at the central portion of the closure where a portion of the muscle was incorporated into the closure.  The right eyelid was addressed second the skin was excised and the surgical wound closed again with 6-0 chromic suture. The patient was allowed to wake up and taken to recovery room in stable condition at the end of the case. The family was notified at the end of the case.   The advanced  practice practitioner (APP) assisted throughout the case.  The APP was essential in retraction and counter traction when needed to make the case progress smoothly.  This retraction and assistance made it possible to see the tissue plans for the procedure.  The assistance was needed for blood control, tissue re-approximation and assisted with closure of the incision site.

## 2022-02-03 NOTE — Interval H&P Note (Signed)
History and Physical Interval Note: Pt and husband met in the pre op area, no change in physical exam or indication for surgery. Surgical site marked.  Will proceed with blepharoplasty at her request   02/03/2022 3:45 PM  Lauren Bolton  has presented today for surgery, with the diagnosis of Dermatochalasis of both upper eyelids.  The various methods of treatment have been discussed with the patient and family. After consideration of risks, benefits and other options for treatment, the patient has consented to  Procedure(s): BLEPHAROPLASTY -upper eyelids (Bilateral) as a surgical intervention.  The patient's history has been reviewed, patient examined, no change in status, stable for surgery.  I have reviewed the patient's chart and labs.  Questions were answered to the patient's satisfaction.     Camillia Herter

## 2022-02-03 NOTE — Anesthesia Procedure Notes (Signed)
Procedure Name: LMA Insertion Date/Time: 02/03/2022 4:07 PM  Performed by: Maryella Shivers, CRNAPre-anesthesia Checklist: Patient identified, Emergency Drugs available, Suction available and Patient being monitored Patient Re-evaluated:Patient Re-evaluated prior to induction Oxygen Delivery Method: Circle system utilized Preoxygenation: Pre-oxygenation with 100% oxygen Induction Type: IV induction Ventilation: Mask ventilation without difficulty LMA: LMA flexible inserted LMA Size: 4.0 Number of attempts: 1 Airway Equipment and Method: Bite block Placement Confirmation: positive ETCO2 Tube secured with: Tape Dental Injury: Teeth and Oropharynx as per pre-operative assessment

## 2022-02-03 NOTE — Transfer of Care (Signed)
Immediate Anesthesia Transfer of Care Note  Patient: Lauren Bolton  Procedure(s) Performed: Blepharoplasty Upper Eyelids (Bilateral: Eye)  Patient Location: PACU  Anesthesia Type:General  Level of Consciousness: awake, alert , and oriented  Airway & Oxygen Therapy: Patient Spontanous Breathing and Patient connected to face mask oxygen  Post-op Assessment: Report given to RN and Post -op Vital signs reviewed and stable  Post vital signs: Reviewed and stable  Last Vitals:  Vitals Value Taken Time  BP 143/72 02/03/22 1704  Temp    Pulse 117 02/03/22 1705  Resp 43 02/03/22 1705  SpO2 100 % 02/03/22 1705  Vitals shown include unvalidated device data.  Last Pain:  Vitals:   02/03/22 1036  TempSrc: Oral  PainSc: 0-No pain      Patients Stated Pain Goal: 3 (34/74/25 9563)  Complications: No notable events documented.

## 2022-02-04 ENCOUNTER — Encounter: Payer: Medicare Other | Admitting: Plastic Surgery

## 2022-02-04 ENCOUNTER — Encounter (HOSPITAL_BASED_OUTPATIENT_CLINIC_OR_DEPARTMENT_OTHER): Payer: Self-pay | Admitting: Plastic Surgery

## 2022-02-09 NOTE — Progress Notes (Unsigned)
Patient is a 62 year old female with PMH of bilateral dermatochalasis upper eyelids s/p upper blepharoplasty performed 02/03/2022 by Dr. Lovena Le who presents to clinic for postoperative follow-up.  Reviewed operative report and the excision sites were closed with running chromic gut sutures which can last 2 to 4 weeks.  Today, she is accompanied by her husband as well as an interpreter at bedside.  She states that she has been doing well postoperatively.  She is pleased.  She has had some dry eye symptoms for which her husband has found refresh p.m. at [employer] and has been treating appropriately.  She states that she can close her eye without difficulty.  Denies any changes in her vision.  She feels as though the "heaviness" has improved.  On exam, she is without any periorbital edema or swelling.  PERRL and EOM intact.  No significant bruising.  Incisions all intact.  Few scattered suture knots removed without complication or difficulty.  She is doing well from a postoperative standpoint.  Continue with refresh p.m. as needed for dry eye symptoms.  Return in 2 weeks for likely final postoperative encounter and to discuss strategies for scar mitigation.  Dr. Lovena Le also evaluated patient at bedside and is agreeable with plan.  Picture(s) obtained of the patient and placed in the chart were with the patient's or guardian's permission.

## 2022-02-09 NOTE — Anesthesia Postprocedure Evaluation (Signed)
Anesthesia Post Note  Patient: Lauren Bolton  Procedure(s) Performed: Blepharoplasty Upper Eyelids (Bilateral: Eye)     Patient location during evaluation: PACU Anesthesia Type: General Level of consciousness: awake and alert Pain management: pain level controlled Vital Signs Assessment: post-procedure vital signs reviewed and stable Respiratory status: spontaneous breathing, nonlabored ventilation and respiratory function stable Cardiovascular status: blood pressure returned to baseline and stable Postop Assessment: no apparent nausea or vomiting Anesthetic complications: no   No notable events documented.  Last Vitals:  Vitals:   02/03/22 1745 02/03/22 1800  BP: 137/73 (!) 142/78  Pulse: 94 87  Resp: 16 20  Temp:  36.8 C  SpO2: 99% 97%    Last Pain:  Vitals:   02/04/22 1435  TempSrc:   PainSc: 0-No pain                 Tavien Chestnut

## 2022-02-11 ENCOUNTER — Encounter: Payer: Self-pay | Admitting: Physician Assistant

## 2022-02-11 ENCOUNTER — Encounter: Payer: Medicare Other | Admitting: Physician Assistant

## 2022-02-11 ENCOUNTER — Ambulatory Visit (INDEPENDENT_AMBULATORY_CARE_PROVIDER_SITE_OTHER): Payer: Medicare Other | Admitting: Physician Assistant

## 2022-02-11 VITALS — BP 159/87 | HR 77

## 2022-02-11 DIAGNOSIS — H02831 Dermatochalasis of right upper eyelid: Secondary | ICD-10-CM

## 2022-02-11 DIAGNOSIS — H02834 Dermatochalasis of left upper eyelid: Secondary | ICD-10-CM

## 2022-02-24 NOTE — Progress Notes (Signed)
Patient is a 62 year old female with PMH of bilateral dermatochalasis upper eyelids s/p upper blepharoplasty performed 02/03/2022 by Dr. Lovena Le who presents to clinic for postoperative follow-up.   She was last seen here in clinic on 02/11/2022.  At that time, exam was entirely reassuring.  She was experiencing dry eye symptoms which they were treating appropriately with refresh p.m.  Plan was for continued refresh p.m. as needed, but otherwise looked good from postoperative standpoint.  Return in 2 weeks.  Today, patient is accompanied by her husband as well as an interpreter at bedside.  She tells me that she does experience some discomfort and residual tightness in upper eyelids bilaterally.  She also feels as though there may be still some protruding suture that has not yet dissolved/absorbed.  She is otherwise pleased and denies any other complaints.  On exam, upper blepharoplasty sites appear to be healing well.  Mild overlying scabbing.  Scattered suture pieces are removed, well-tolerated by patient.  No bleeding provoked.  No wounds or areas of dehiscence.  No surrounding erythema.  Recommending that patient apply a thin film of Vaseline or refresh p.m. over the incision daily to help with the mild scabbing.  Also recommended gentle mechanical massage twice daily.  Complete absorption of the Chromic Gut suture that was used can take 8 to 12 weeks.  Mechanical massage will help with breakdown of absorption.  Follow-up in 2 weeks with Dr. Lovena Le for likely final postoperative encounter.  Discussed that after a week or 2, she can transition to using silicone scar gels.  She will call the clinic should she have any questions or concerns in interim.

## 2022-02-25 ENCOUNTER — Encounter: Payer: Medicare Other | Admitting: Physician Assistant

## 2022-02-25 ENCOUNTER — Ambulatory Visit (INDEPENDENT_AMBULATORY_CARE_PROVIDER_SITE_OTHER): Payer: Medicare Other | Admitting: Physician Assistant

## 2022-02-25 DIAGNOSIS — H02834 Dermatochalasis of left upper eyelid: Secondary | ICD-10-CM

## 2022-02-25 DIAGNOSIS — H02831 Dermatochalasis of right upper eyelid: Secondary | ICD-10-CM

## 2022-03-11 ENCOUNTER — Ambulatory Visit (INDEPENDENT_AMBULATORY_CARE_PROVIDER_SITE_OTHER): Payer: Medicare Other | Admitting: Plastic Surgery

## 2022-03-11 ENCOUNTER — Encounter: Payer: Self-pay | Admitting: Plastic Surgery

## 2022-03-11 VITALS — BP 160/82 | HR 86 | Ht 60.0 in | Wt 100.2 lb

## 2022-03-11 DIAGNOSIS — F332 Major depressive disorder, recurrent severe without psychotic features: Secondary | ICD-10-CM | POA: Diagnosis not present

## 2022-03-11 DIAGNOSIS — R7301 Impaired fasting glucose: Secondary | ICD-10-CM | POA: Diagnosis not present

## 2022-03-11 DIAGNOSIS — I700 Atherosclerosis of aorta: Secondary | ICD-10-CM | POA: Diagnosis not present

## 2022-03-11 DIAGNOSIS — E890 Postprocedural hypothyroidism: Secondary | ICD-10-CM | POA: Diagnosis not present

## 2022-03-11 DIAGNOSIS — F411 Generalized anxiety disorder: Secondary | ICD-10-CM | POA: Diagnosis not present

## 2022-03-11 DIAGNOSIS — Z9889 Other specified postprocedural states: Secondary | ICD-10-CM

## 2022-03-11 NOTE — Progress Notes (Signed)
Lauren Bolton returns today after bilateral upper lid blepharoplasty.  She has no complaints and is happy with the overall results.  On examination the incisions are well-healed.  She has good eye closure.  There is nice symmetry and good overall aesthetic result.  Status post bilateral upper lid blepharoplasty: Doing well may return to see me as needed.

## 2022-03-25 DIAGNOSIS — K080 Exfoliation of teeth due to systemic causes: Secondary | ICD-10-CM | POA: Diagnosis not present

## 2022-04-15 DIAGNOSIS — K080 Exfoliation of teeth due to systemic causes: Secondary | ICD-10-CM | POA: Diagnosis not present

## 2022-04-16 ENCOUNTER — Telehealth (HOSPITAL_COMMUNITY): Payer: Self-pay | Admitting: *Deleted

## 2022-04-16 NOTE — Telephone Encounter (Signed)
LORazepam (ATIVAN) 0.5 MG tablet   Summary: Take 0.5 tablets (0.25 mg total) by mouth at bedtime as needed for anxiety., Starting Thu 11/20/2021, Normal Dose, Route, Frequency: 0.25 mg, Oral, At bedtime PRNDx Associated: End Date Warning: Different Encounter: Start Date & Time: 11/16/2023Ord/Sold: 11/20/2021 (O)Ordered On: 11/16/2023ReportPharmacy: [id number] [place],  - [address] AT [facility]   *Note to Pharmacy: No early refills  LAST REFILL:: 03/17/22

## 2022-07-15 DIAGNOSIS — E890 Postprocedural hypothyroidism: Secondary | ICD-10-CM | POA: Diagnosis not present

## 2022-07-15 DIAGNOSIS — R7301 Impaired fasting glucose: Secondary | ICD-10-CM | POA: Diagnosis not present

## 2022-07-15 DIAGNOSIS — I700 Atherosclerosis of aorta: Secondary | ICD-10-CM | POA: Diagnosis not present

## 2022-07-22 DIAGNOSIS — K080 Exfoliation of teeth due to systemic causes: Secondary | ICD-10-CM | POA: Diagnosis not present

## 2022-08-05 DIAGNOSIS — K080 Exfoliation of teeth due to systemic causes: Secondary | ICD-10-CM | POA: Diagnosis not present

## 2022-08-20 ENCOUNTER — Encounter (HOSPITAL_COMMUNITY): Payer: Self-pay

## 2022-08-20 ENCOUNTER — Emergency Department (HOSPITAL_COMMUNITY)
Admission: EM | Admit: 2022-08-20 | Discharge: 2022-08-21 | Disposition: A | Discharge status: Home / Self Care | Payer: Medicare Other | Attending: Emergency Medicine | Admitting: Emergency Medicine

## 2022-08-20 ENCOUNTER — Other Ambulatory Visit: Payer: Self-pay

## 2022-08-20 DIAGNOSIS — N390 Urinary tract infection, site not specified: Secondary | ICD-10-CM | POA: Diagnosis not present

## 2022-08-20 DIAGNOSIS — R531 Weakness: Secondary | ICD-10-CM | POA: Diagnosis not present

## 2022-08-20 DIAGNOSIS — M791 Myalgia, unspecified site: Secondary | ICD-10-CM | POA: Insufficient documentation

## 2022-08-20 DIAGNOSIS — R6883 Chills (without fever): Secondary | ICD-10-CM | POA: Diagnosis not present

## 2022-08-20 DIAGNOSIS — Z1152 Encounter for screening for COVID-19: Secondary | ICD-10-CM | POA: Diagnosis not present

## 2022-08-20 DIAGNOSIS — R509 Fever, unspecified: Secondary | ICD-10-CM | POA: Diagnosis not present

## 2022-08-20 LAB — SARS CORONAVIRUS 2 BY RT PCR: SARS Coronavirus 2 by RT PCR: NEGATIVE

## 2022-08-20 NOTE — ED Triage Notes (Signed)
Generalized weakness, body aches, and chills since this past Sunday.

## 2022-08-21 ENCOUNTER — Emergency Department (HOSPITAL_COMMUNITY): Payer: Medicare Other

## 2022-08-21 DIAGNOSIS — R4182 Altered mental status, unspecified: Secondary | ICD-10-CM | POA: Diagnosis not present

## 2022-08-21 DIAGNOSIS — N390 Urinary tract infection, site not specified: Secondary | ICD-10-CM | POA: Diagnosis not present

## 2022-08-21 DIAGNOSIS — R531 Weakness: Secondary | ICD-10-CM | POA: Diagnosis not present

## 2022-08-21 DIAGNOSIS — R509 Fever, unspecified: Secondary | ICD-10-CM | POA: Diagnosis not present

## 2022-08-21 DIAGNOSIS — M25512 Pain in left shoulder: Secondary | ICD-10-CM | POA: Diagnosis not present

## 2022-08-21 LAB — URINALYSIS, ROUTINE W REFLEX MICROSCOPIC
Bilirubin Urine: NEGATIVE
Glucose, UA: NEGATIVE mg/dL
Ketones, ur: NEGATIVE mg/dL
Nitrite: NEGATIVE
Protein, ur: NEGATIVE mg/dL
Specific Gravity, Urine: 1.005 (ref 1.005–1.030)
pH: 6 (ref 5.0–8.0)

## 2022-08-21 MED ORDER — CEPHALEXIN 500 MG PO CAPS: 500.0000 mg | ORAL_CAPSULE | Freq: Two times a day (BID) | ORAL | 0 refills | Status: DC

## 2022-08-21 MED ORDER — CEPHALEXIN 500 MG PO CAPS
500.0000 mg | ORAL_CAPSULE | Freq: Once | ORAL | Status: AC
  Administered 2022-08-21: 500 mg via ORAL
  Filled 2022-08-21: qty 1

## 2022-08-21 NOTE — ED Provider Notes (Signed)
[place] EMERGENCY DEPARTMENT AT [facility] Provider Note   CSN: [id number] Arrival date & time: 08/20/22  1921     History  Chief Complaint  Patient presents with   Generalized Body Aches    Lauren Bolton is a 62 y.o. female.  The history is provided by the patient.  Illness Location:  Body Quality:  Body aches and feeling cold Severity:  Mild Onset quality:  Gradual Duration:  5 days Progression:  Waxing and waning Chronicity:  New Relieved by:  Nothing Worsened by:  Nothing Ineffective treatments:  None Associated symptoms: no abdominal pain, no chest pain, no congestion, no cough, no diarrhea, no fever, no headaches, no loss of consciousness, no nausea, no rash, no rhinorrhea, no shortness of breath, no sore throat, no vomiting and no wheezing   Patient with depression presents with myalgias and feeling cold since Sunday.  No adssociated symptoms.       Home Medications Prior to Admission medications   Medication Sig Start Date End Date Taking? Authorizing Provider  Carboxymethylcellul-Glycerin (CLEAR EYES FOR DRY EYES OP) Place 1 drop into both eyes daily as needed (dry eyes).    [provider]  Cholecalciferol (VITAMIN D PO) Take 1 tablet by mouth daily.    [provider]  FIBER ADULT GUMMIES PO Take 1 tablet by mouth daily.    [provider]  ibuprofen (ADVIL,MOTRIN) 200 MG tablet Take 200-400 mg by mouth daily as needed for moderate pain.    [provider]  iron polysaccharides (NIFEREX) 150 MG capsule Take 1 capsule (150 mg total) by mouth 2 (two) times daily. 02/06/16   Adonis Brook, NP  levothyroxine (SYNTHROID) 100 MCG tablet Take 1 tablet (100 mcg total) by mouth daily before breakfast. 02/07/16   Adonis Brook, NP  LORazepam (ATIVAN) 0.5 MG tablet Take 0.5 tablets (0.25 mg total) by mouth at bedtime as needed for anxiety. 11/20/21   Oletta Darter, MD  Omega-3 Fatty Acids (FISH OIL) 1000 MG CAPS Take 1  capsule by mouth.    [provider]  ondansetron (ZOFRAN-ODT) 4 MG disintegrating tablet Take 1 tablet (4 mg total) by mouth every 8 (eight) hours as needed for nausea or vomiting. 01/14/22   Lorelee New, PA-C      Allergies    Benadryl [diphenhydramine hcl], Alprazolam, and Lactose intolerance (gi)    Review of Systems   Review of Systems  Constitutional:  Negative for fever.  HENT:  Negative for congestion, rhinorrhea and sore throat.   Eyes:  Negative for redness.  Respiratory:  Negative for cough, shortness of breath and wheezing.   Cardiovascular:  Negative for chest pain.  Gastrointestinal:  Negative for abdominal pain, diarrhea, nausea and vomiting.  Genitourinary:  Negative for dysuria.  Skin:  Negative for rash.  Neurological:  Negative for loss of consciousness and headaches.  All other systems reviewed and are negative.   Physical Exam Updated Vital Signs BP (!) 140/79   Pulse 81   Temp 98 F (36.7 C) (Oral)   Resp 16   SpO2 100%  Physical Exam Vitals and nursing note reviewed.  Constitutional:      General: She is not in acute distress.    Appearance: Normal appearance. She is well-developed.  HENT:     Head: Normocephalic and atraumatic.     Nose: Nose normal.  Eyes:     Pupils: Pupils are equal, round, and reactive to light.  Cardiovascular:     Rate and  Rhythm: Normal rate and regular rhythm.     Pulses: Normal pulses.     Heart sounds: Normal heart sounds.  Pulmonary:     Effort: Pulmonary effort is normal. No respiratory distress.     Breath sounds: Normal breath sounds.  Abdominal:     General: Bowel sounds are normal. There is no distension.     Palpations: Abdomen is soft.     Tenderness: There is no abdominal tenderness. There is no guarding or rebound.  Musculoskeletal:        General: Normal range of motion.     Cervical back: Neck supple.  Skin:    General: Skin is warm and dry.     Capillary Refill: Capillary refill takes  less than 2 seconds.     Findings: No erythema or rash.  Neurological:     General: No focal deficit present.     Mental Status: She is alert and oriented to person, place, and time.     Deep Tendon Reflexes: Reflexes normal.  Psychiatric:        Mood and Affect: Mood normal.        Behavior: Behavior normal.     ED Results / Procedures / Treatments   Labs (all labs ordered are listed, but only abnormal results are displayed) Labs Reviewed  URINALYSIS, ROUTINE W REFLEX MICROSCOPIC - Abnormal; Notable for the following components:      Result Value   Color, Urine STRAW (*)    Hgb urine dipstick SMALL (*)    Leukocytes,Ua MODERATE (*)    Bacteria, UA MANY (*)    All other components within normal limits  SARS CORONAVIRUS 2 BY RT PCR    EKG None  Radiology DG Chest Portable 1 View  Result Date: 08/21/2022 CLINICAL DATA:  Fever, weakness, body aches EXAM: PORTABLE CHEST 1 VIEW COMPARISON:  12/23/2015 FINDINGS: Lungs are clear.  No pleural effusion or pneumothorax. Heart normal in size. IMPRESSION: No acute cardiopulmonary disease. Electronically Signed   By: Charline Bills M.D.   On: 08/21/2022 01:25    Procedures Procedures    Medications Ordered in ED Medications - No data to display  ED Course/ Medical Decision Making/ A&P                                 Medical Decision Making Chills and myalgias since Sunday   Amount and/or Complexity of Data Reviewed Independent Historian: spouse    Details: See above  External Data Reviewed: notes.    Details: Previous notes reviewed  Labs: ordered.    Details: Urine is consistent with UTI, negative covid  Radiology: ordered and independent interpretation performed.    Details: Negative CXR  Risk Risk Details: Very well appearing with normal vitals and exam.  No signs of sepsis.  Stable for discharge.      Final Clinical Impression(s) / ED Diagnoses Final diagnoses:  None   Return for intractable cough,  coughing up blood, fevers > 100.4 unrelieved by medication, shortness of breath, intractable vomiting, chest pain, shortness of breath, weakness, numbness, changes in speech, facial asymmetry, abdominal pain, passing out, Inability to tolerate liquids or food, cough, altered mental status or any concerns. No signs of systemic illness or infection. The patient is nontoxic-appearing on exam and vital signs are within normal limits.  I have reviewed the triage vital signs and the nursing notes. Pertinent labs & imaging results that were available  during my care of the patient were reviewed by me and considered in my medical decision making (see chart for details). After history, exam, and medical workup I feel the patient has been appropriately medically screened and is safe for discharge home. Pertinent diagnoses were discussed with the patient. Patient was given return precautions.    Rx / DC Orders ED Discharge Orders     None         Areliz Rothman, MD 08/21/22 1610

## 2022-08-24 ENCOUNTER — Other Ambulatory Visit: Payer: Self-pay

## 2022-08-24 ENCOUNTER — Emergency Department (HOSPITAL_COMMUNITY): Payer: Medicare Other

## 2022-08-24 ENCOUNTER — Encounter (HOSPITAL_COMMUNITY): Payer: Self-pay | Admitting: Emergency Medicine

## 2022-08-24 ENCOUNTER — Emergency Department (HOSPITAL_COMMUNITY)
Admission: EM | Admit: 2022-08-24 | Discharge: 2022-08-24 | Disposition: A | Discharge status: Home / Self Care | Payer: Medicare Other | Attending: Emergency Medicine | Admitting: Emergency Medicine

## 2022-08-24 DIAGNOSIS — R109 Unspecified abdominal pain: Secondary | ICD-10-CM | POA: Diagnosis not present

## 2022-08-24 DIAGNOSIS — E860 Dehydration: Secondary | ICD-10-CM | POA: Diagnosis not present

## 2022-08-24 DIAGNOSIS — R531 Weakness: Secondary | ICD-10-CM

## 2022-08-24 DIAGNOSIS — N390 Urinary tract infection, site not specified: Secondary | ICD-10-CM | POA: Diagnosis not present

## 2022-08-24 DIAGNOSIS — Z20822 Contact with and (suspected) exposure to covid-19: Secondary | ICD-10-CM | POA: Insufficient documentation

## 2022-08-24 LAB — COMPREHENSIVE METABOLIC PANEL
ALT: 24 U/L (ref 0–44)
AST: 32 U/L (ref 15–41)
Albumin: 3.9 g/dL (ref 3.5–5.0)
Alkaline Phosphatase: 70 U/L (ref 38–126)
Anion gap: 9 (ref 5–15)
BUN: 13 mg/dL (ref 8–23)
CO2: 23 mmol/L (ref 22–32)
Calcium: 8.9 mg/dL (ref 8.9–10.3)
Chloride: 101 mmol/L (ref 98–111)
Creatinine, Ser: 0.92 mg/dL (ref 0.44–1.00)
GFR, Estimated: >60 mL/min (ref >60)
Glucose, Bld: 105 mg/dL — ABNORMAL HIGH (ref 70–99)
Potassium: 3.3 mmol/L — ABNORMAL LOW (ref 3.5–5.1)
Sodium: 133 mmol/L — ABNORMAL LOW (ref 135–145)
Total Bilirubin: 0.7 mg/dL (ref 0.3–1.2)
Total Protein: 7.5 g/dL (ref 6.5–8.1)

## 2022-08-24 LAB — URINALYSIS, W/ REFLEX TO CULTURE (INFECTION SUSPECTED)
Bilirubin Urine: NEGATIVE
Glucose, UA: NEGATIVE mg/dL
Ketones, ur: NEGATIVE mg/dL
Leukocytes,Ua: NEGATIVE
Nitrite: NEGATIVE
Protein, ur: NEGATIVE mg/dL
Specific Gravity, Urine: 1.006 (ref 1.005–1.030)
pH: 6 (ref 5.0–8.0)

## 2022-08-24 LAB — CBC WITH DIFFERENTIAL/PLATELET
Abs Immature Granulocytes: 0.01 10*3/uL (ref 0.00–0.07)
Basophils Absolute: 0 10*3/uL (ref 0.0–0.1)
Basophils Relative: 0 %
Eosinophils Absolute: 0.1 10*3/uL (ref 0.0–0.5)
Eosinophils Relative: 1 %
HCT: 36.5 % (ref 36.0–46.0)
Hemoglobin: 12.1 g/dL (ref 12.0–15.0)
Immature Granulocytes: 0 %
Lymphocytes Relative: 32 %
Lymphs Abs: 1.7 10*3/uL (ref 0.7–4.0)
MCH: 24.5 pg — ABNORMAL LOW (ref 26.0–34.0)
MCHC: 33.2 g/dL (ref 30.0–36.0)
MCV: 73.9 fL — ABNORMAL LOW (ref 80.0–100.0)
Monocytes Absolute: 0.3 10*3/uL (ref 0.1–1.0)
Monocytes Relative: 6 %
Neutro Abs: 3.2 10*3/uL (ref 1.7–7.7)
Neutrophils Relative %: 61 %
Platelets: 202 10*3/uL (ref 150–400)
RBC: 4.94 MIL/uL (ref 3.87–5.11)
RDW: 14.3 % (ref 11.5–15.5)
WBC: 5.3 10*3/uL (ref 4.0–10.5)
nRBC: 0 % (ref 0.0–0.2)

## 2022-08-24 LAB — I-STAT CG4 LACTIC ACID, ED
Lactic Acid, Venous: 1 mmol/L (ref 0.5–1.9)
Lactic Acid, Venous: 1 mmol/L (ref 0.5–1.9)

## 2022-08-24 LAB — SARS CORONAVIRUS 2 BY RT PCR: SARS Coronavirus 2 by RT PCR: NEGATIVE

## 2022-08-24 MED ORDER — SODIUM CHLORIDE 0.9 % IV BOLUS
1000.0000 mL | Freq: Once | INTRAVENOUS | Status: AC
  Administered 2022-08-24: 1000 mL via INTRAVENOUS

## 2022-08-24 MED ORDER — ONDANSETRON HCL 4 MG/2ML IJ SOLN
4.0000 mg | Freq: Once | INTRAMUSCULAR | Status: AC
  Administered 2022-08-24: 4 mg via INTRAVENOUS
  Filled 2022-08-24: qty 2

## 2022-08-24 NOTE — ED Triage Notes (Signed)
Pt BIB [facility] from home, c/o weakness. Recently treated for UTI. Reported passing out on Friday. Increased weakness and nausea.   BP 142/88 P 93 spO2 98%  T 97.9

## 2022-08-24 NOTE — Discharge Instructions (Addendum)
As discussed, your evaluation today has been largely reassuring.  But, it is important that you monitor your condition carefully, and do not hesitate to return to the ED if you develop new, or concerning changes in your condition. ? ?Otherwise, please follow-up with your physician for appropriate ongoing care. ? ?

## 2022-08-24 NOTE — ED Provider Notes (Signed)
[place] EMERGENCY DEPARTMENT AT [facility] Provider Note   CSN: [id number] Arrival date & time: 08/24/22  3664     History  Chief Complaint  Patient presents with   Weakness    Lauren Bolton is a 62 y.o. female.  HPI Patient presents with her husband who assists with the history.  Patient has history of anxiety, depression, electrolyte abnormalities, and was seen, evaluated here 4 days ago, diagnosed with urinary tract infection.  In spite of taking her antibiotics patient feels progressively worse in general with abdominal discomfort, nonfocal.  She is able to urinate, though notes that she has minimal urine production. She is taking of the medication as directed, reportedly.     Home Medications Prior to Admission medications   Medication Sig Start Date End Date Taking? Authorizing Provider  Carboxymethylcellul-Glycerin (CLEAR EYES FOR DRY EYES OP) Place 1 drop into both eyes daily as needed (dry eyes).    [provider]  cephALEXin (KEFLEX) 500 MG capsule Take 1 capsule (500 mg total) by mouth 2 (two) times daily. 08/21/22   Palumbo, April, MD  Cholecalciferol (VITAMIN D PO) Take 1 tablet by mouth daily.    [provider]  FIBER ADULT GUMMIES PO Take 1 tablet by mouth daily.    [provider]  ibuprofen (ADVIL,MOTRIN) 200 MG tablet Take 200-400 mg by mouth daily as needed for moderate pain.    [provider]  iron polysaccharides (NIFEREX) 150 MG capsule Take 1 capsule (150 mg total) by mouth 2 (two) times daily. 02/06/16   Adonis Brook, NP  levothyroxine (SYNTHROID) 100 MCG tablet Take 1 tablet (100 mcg total) by mouth daily before breakfast. 02/07/16   Adonis Brook, NP  LORazepam (ATIVAN) 0.5 MG tablet Take 0.5 tablets (0.25 mg total) by mouth at bedtime as needed for anxiety. 11/20/21   Oletta Darter, MD  Omega-3 Fatty Acids (FISH OIL) 1000 MG CAPS Take 1 capsule by mouth.    [provider]  ondansetron  (ZOFRAN-ODT) 4 MG disintegrating tablet Take 1 tablet (4 mg total) by mouth every 8 (eight) hours as needed for nausea or vomiting. 01/14/22   Lorelee New, PA-C      Allergies    Benadryl [diphenhydramine hcl], Alprazolam, and Lactose intolerance (gi)    Review of Systems   Review of Systems  All other systems reviewed and are negative.   Physical Exam Updated Vital Signs BP 133/79   Pulse 76   Temp 98.5 F (36.9 C) (Oral)   Resp 17   SpO2 100%  Physical Exam Vitals and nursing note reviewed.  Constitutional:      General: She is not in acute distress.    Appearance: She is well-developed.     Comments: Withdrawn, chronically ill-appearing adult female in no distress.  HENT:     Head: Normocephalic and atraumatic.  Eyes:     Conjunctiva/sclera: Conjunctivae normal.  Cardiovascular:     Rate and Rhythm: Normal rate and regular rhythm.     Pulses: Normal pulses.  Pulmonary:     Effort: Pulmonary effort is normal. No respiratory distress.     Breath sounds: Normal breath sounds. No stridor.  Abdominal:     General: There is no distension.     Comments: Mild discomfort with palpation, no focal tenderness, guarding, rebound  Skin:    General: Skin is warm and dry.  Neurological:     Mental Status: She is alert and oriented to person, place, and time.  Cranial Nerves: No cranial nerve deficit.  Psychiatric:        Mood and Affect: Mood normal.     ED Results / Procedures / Treatments   Labs (all labs ordered are listed, but only abnormal results are displayed) Labs Reviewed  COMPREHENSIVE METABOLIC PANEL - Abnormal; Notable for the following components:      Result Value   Sodium 133 (*)    Potassium 3.3 (*)    Glucose, Bld 105 (*)    All other components within normal limits  CBC WITH DIFFERENTIAL/PLATELET - Abnormal; Notable for the following components:   MCV 73.9 (*)    MCH 24.5 (*)    All other components within normal limits  URINALYSIS, W/ REFLEX  TO CULTURE (INFECTION SUSPECTED) - Abnormal; Notable for the following components:   Color, Urine STRAW (*)    Hgb urine dipstick SMALL (*)    Bacteria, UA RARE (*)    All other components within normal limits  SARS CORONAVIRUS 2 BY RT PCR  HCG, SERUM, QUALITATIVE  I-STAT CG4 LACTIC ACID, ED  I-STAT CG4 LACTIC ACID, ED    EKG None  Radiology CT Renal Stone Study  Result Date: 08/24/2022 CLINICAL DATA:  Acute flank pain. EXAM: CT ABDOMEN AND PELVIS WITHOUT CONTRAST TECHNIQUE: Multidetector CT imaging of the abdomen and pelvis was performed following the standard protocol without IV contrast. RADIATION DOSE REDUCTION: This exam was performed according to the departmental dose-optimization program which includes automated exposure control, adjustment of the mA and/or kV according to patient size and/or use of iterative reconstruction technique. COMPARISON:  December 23, 2015. FINDINGS: Lower chest: No acute abnormality. Hepatobiliary: No focal liver abnormality is seen. No gallstones, gallbladder wall thickening, or biliary dilatation. Pancreas: Unremarkable. No pancreatic ductal dilatation or surrounding inflammatory changes. Spleen: Normal in size without focal abnormality. Adrenals/Urinary Tract: Adrenal glands are unremarkable. Kidneys are normal, without renal calculi, focal lesion, or hydronephrosis. Bladder is unremarkable. Stomach/Bowel: Stomach is within normal limits. Appendix appears normal. No evidence of bowel wall thickening, distention, or inflammatory changes. Vascular/Lymphatic: Aortic atherosclerosis. No enlarged abdominal or pelvic lymph nodes. Reproductive: Uterus and bilateral adnexa are unremarkable. Other: No abdominal wall hernia or abnormality. No abdominopelvic ascites. Musculoskeletal: No acute or significant osseous findings. IMPRESSION: No acute abnormality seen in the abdomen or pelvis. Aortic Atherosclerosis (ICD10-I70.0). Electronically Signed   By: Lupita Raider M.D.    On: 08/24/2022 15:18    Procedures Procedures    Medications Ordered in ED Medications  sodium chloride 0.9 % bolus 1,000 mL (0 mLs Intravenous Stopped 08/24/22 1432)  ondansetron (ZOFRAN) injection 4 mg (4 mg Intravenous Given 08/24/22 7829)    ED Course/ Medical Decision Making/ A&P                                 Medical Decision Making Adult female with history of psychiatric disease, electrolyte abnormalities, anemia, presents with generalized discomfort, weakness, anorexia in context of recently diagnosed urinary tract infection.  Differential including electrolyte abnormalities, dehydration, bacteremia, sepsis considered. Cardiac 75 sinus normal Pulse ox 100% room air normal   Amount and/or Complexity of Data Reviewed Independent Historian: spouse External Data Reviewed: notes.    Details: ED eval with diagnosis of urinary tract infection 4 days ago reviewed Labs: ordered. Decision-making details documented in ED Course. Radiology: ordered.  Risk Prescription drug management. Decision regarding hospitalization. Diagnosis or treatment significantly limited by social determinants of health.  3:43 PM Patient states that she is feeling better, she remains hemodynamically unremarkable I reviewed the CT, labs with the patient and her husband.  No lactic acidosis, no leukocytosis, no fever, no evidence for bacteremia, sepsis. Some suspicion for dehydration given the patient's initial physical exam, and she has received fluid resuscitation here.  With reassuring CT, labs, patient appropriate for discharge with outpatient follow-up.        Final Clinical Impression(s) / ED Diagnoses Final diagnoses:  Weakness  Dehydration    Rx / DC Orders ED Discharge Orders     None         Gerhard Munch, MD 08/24/22 [phone]

## 2022-09-01 ENCOUNTER — Telehealth: Payer: Self-pay | Admitting: *Deleted

## 2022-09-01 NOTE — Telephone Encounter (Signed)
Transition Care Management Unsuccessful Follow-up Telephone Call  Date of discharge and from where:  [facility] 08/24/2022  Attempts:  1st Attempt  Reason for unsuccessful TCM follow-up call:  No answer/busy

## 2022-09-02 DIAGNOSIS — R82998 Other abnormal findings in urine: Secondary | ICD-10-CM | POA: Diagnosis not present

## 2022-09-02 DIAGNOSIS — E876 Hypokalemia: Secondary | ICD-10-CM | POA: Diagnosis not present

## 2022-09-03 ENCOUNTER — Telehealth: Payer: Self-pay | Admitting: *Deleted

## 2022-09-03 NOTE — Telephone Encounter (Signed)
Transition Care Management Unsuccessful Follow-up Telephone Call  Date of discharge and from where:  [facility]  08/24/2022  Attempts:  2nd Attempt  Reason for unsuccessful TCM follow-up call:  No answer/busy

## 2022-11-04 DIAGNOSIS — E890 Postprocedural hypothyroidism: Secondary | ICD-10-CM | POA: Diagnosis not present

## 2022-11-04 DIAGNOSIS — R7301 Impaired fasting glucose: Secondary | ICD-10-CM | POA: Diagnosis not present

## 2022-11-04 DIAGNOSIS — M810 Age-related osteoporosis without current pathological fracture: Secondary | ICD-10-CM | POA: Diagnosis not present

## 2022-11-04 DIAGNOSIS — D649 Anemia, unspecified: Secondary | ICD-10-CM | POA: Diagnosis not present

## 2022-11-11 DIAGNOSIS — Z0000 Encounter for general adult medical examination without abnormal findings: Secondary | ICD-10-CM | POA: Diagnosis not present

## 2022-11-11 DIAGNOSIS — I700 Atherosclerosis of aorta: Secondary | ICD-10-CM | POA: Diagnosis not present

## 2022-11-11 DIAGNOSIS — R82998 Other abnormal findings in urine: Secondary | ICD-10-CM | POA: Diagnosis not present

## 2022-11-11 DIAGNOSIS — Z1339 Encounter for screening examination for other mental health and behavioral disorders: Secondary | ICD-10-CM | POA: Diagnosis not present

## 2022-11-11 DIAGNOSIS — Z1331 Encounter for screening for depression: Secondary | ICD-10-CM | POA: Diagnosis not present

## 2022-11-27 DIAGNOSIS — R7989 Other specified abnormal findings of blood chemistry: Secondary | ICD-10-CM | POA: Diagnosis not present

## 2023-01-13 DIAGNOSIS — M816 Localized osteoporosis [Lequesne]: Secondary | ICD-10-CM | POA: Diagnosis not present

## 2023-01-13 DIAGNOSIS — Z6820 Body mass index (BMI) 20.0-20.9, adult: Secondary | ICD-10-CM | POA: Diagnosis not present

## 2023-01-13 DIAGNOSIS — D649 Anemia, unspecified: Secondary | ICD-10-CM | POA: Diagnosis not present

## 2023-01-13 DIAGNOSIS — Z01419 Encounter for gynecological examination (general) (routine) without abnormal findings: Secondary | ICD-10-CM | POA: Diagnosis not present

## 2023-01-13 DIAGNOSIS — Z1231 Encounter for screening mammogram for malignant neoplasm of breast: Secondary | ICD-10-CM | POA: Diagnosis not present

## 2023-01-20 ENCOUNTER — Encounter (HOSPITAL_COMMUNITY): Payer: Self-pay

## 2023-01-20 ENCOUNTER — Observation Stay (HOSPITAL_COMMUNITY)
Admission: EM | Admit: 2023-01-20 | Discharge: 2023-01-22 | Disposition: A | Discharge status: Home / Self Care | Payer: Medicare Other | Attending: Internal Medicine | Admitting: Internal Medicine

## 2023-01-20 ENCOUNTER — Other Ambulatory Visit: Payer: Self-pay

## 2023-01-20 DIAGNOSIS — Z681 Body mass index (BMI) 19 or less, adult: Secondary | ICD-10-CM | POA: Insufficient documentation

## 2023-01-20 DIAGNOSIS — Z79899 Other long term (current) drug therapy: Secondary | ICD-10-CM | POA: Diagnosis not present

## 2023-01-20 DIAGNOSIS — Z9882 Breast implant status: Secondary | ICD-10-CM

## 2023-01-20 DIAGNOSIS — R197 Diarrhea, unspecified: Secondary | ICD-10-CM | POA: Diagnosis present

## 2023-01-20 DIAGNOSIS — Z8249 Family history of ischemic heart disease and other diseases of the circulatory system: Secondary | ICD-10-CM

## 2023-01-20 DIAGNOSIS — D509 Iron deficiency anemia, unspecified: Secondary | ICD-10-CM | POA: Diagnosis not present

## 2023-01-20 DIAGNOSIS — F419 Anxiety disorder, unspecified: Secondary | ICD-10-CM | POA: Diagnosis not present

## 2023-01-20 DIAGNOSIS — R630 Anorexia: Secondary | ICD-10-CM | POA: Insufficient documentation

## 2023-01-20 DIAGNOSIS — R112 Nausea with vomiting, unspecified: Secondary | ICD-10-CM

## 2023-01-20 DIAGNOSIS — F32A Depression, unspecified: Secondary | ICD-10-CM | POA: Diagnosis not present

## 2023-01-20 DIAGNOSIS — G4489 Other headache syndrome: Secondary | ICD-10-CM | POA: Diagnosis not present

## 2023-01-20 DIAGNOSIS — R531 Weakness: Secondary | ICD-10-CM | POA: Insufficient documentation

## 2023-01-20 DIAGNOSIS — E871 Hypo-osmolality and hyponatremia: Secondary | ICD-10-CM | POA: Diagnosis not present

## 2023-01-20 DIAGNOSIS — Z888 Allergy status to other drugs, medicaments and biological substances status: Secondary | ICD-10-CM

## 2023-01-20 DIAGNOSIS — E039 Hypothyroidism, unspecified: Secondary | ICD-10-CM | POA: Diagnosis not present

## 2023-01-20 DIAGNOSIS — R0902 Hypoxemia: Secondary | ICD-10-CM | POA: Diagnosis not present

## 2023-01-20 DIAGNOSIS — Z79890 Hormone replacement therapy (postmenopausal): Secondary | ICD-10-CM | POA: Diagnosis not present

## 2023-01-20 DIAGNOSIS — E876 Hypokalemia: Secondary | ICD-10-CM | POA: Diagnosis not present

## 2023-01-20 DIAGNOSIS — R42 Dizziness and giddiness: Secondary | ICD-10-CM | POA: Diagnosis not present

## 2023-01-20 DIAGNOSIS — D72819 Decreased white blood cell count, unspecified: Secondary | ICD-10-CM | POA: Diagnosis not present

## 2023-01-20 DIAGNOSIS — R55 Syncope and collapse: Secondary | ICD-10-CM

## 2023-01-20 DIAGNOSIS — R638 Other symptoms and signs concerning food and fluid intake: Secondary | ICD-10-CM | POA: Diagnosis present

## 2023-01-20 DIAGNOSIS — E860 Dehydration: Secondary | ICD-10-CM | POA: Diagnosis not present

## 2023-01-20 DIAGNOSIS — R1110 Vomiting, unspecified: Secondary | ICD-10-CM | POA: Diagnosis not present

## 2023-01-20 LAB — URINALYSIS, ROUTINE W REFLEX MICROSCOPIC
Bacteria, UA: NONE SEEN
Bilirubin Urine: NEGATIVE
Glucose, UA: NEGATIVE mg/dL
Ketones, ur: 5 mg/dL — AB
Leukocytes,Ua: NEGATIVE
Nitrite: NEGATIVE
Protein, ur: NEGATIVE mg/dL
Specific Gravity, Urine: 1.003 — ABNORMAL LOW (ref 1.005–1.030)
pH: 6 (ref 5.0–8.0)

## 2023-01-20 LAB — BASIC METABOLIC PANEL WITH GFR
BUN: 9 mg/dL (ref 8–23)
CO2: 21 mmol/L — ABNORMAL LOW (ref 22–32)
Potassium: 3.4 mmol/L — ABNORMAL LOW (ref 3.5–5.1)

## 2023-01-20 LAB — CBC
HCT: 29.3 % — ABNORMAL LOW (ref 36.0–46.0)
Hemoglobin: 10 g/dL — ABNORMAL LOW (ref 12.0–15.0)
MCH: 24.9 pg — ABNORMAL LOW (ref 26.0–34.0)
MCHC: 34.1 g/dL (ref 30.0–36.0)
MCV: 72.9 fL — ABNORMAL LOW (ref 80.0–100.0)
Platelets: 157 10*3/uL (ref 150–400)
RBC: 4.02 MIL/uL (ref 3.87–5.11)
RDW: 13.7 % (ref 11.5–15.5)
WBC: 2.9 10*3/uL — ABNORMAL LOW (ref 4.0–10.5)
nRBC: 0 % (ref 0.0–0.2)

## 2023-01-20 LAB — BASIC METABOLIC PANEL
Anion gap: 11 (ref 5–15)
Calcium: 8.3 mg/dL — ABNORMAL LOW (ref 8.9–10.3)
Chloride: 89 mmol/L — ABNORMAL LOW (ref 98–111)
Creatinine, Ser: 0.61 mg/dL (ref 0.44–1.00)
GFR, Estimated: >60 mL/min (ref >60)
Glucose, Bld: 118 mg/dL — ABNORMAL HIGH (ref 70–99)
Sodium: 121 mmol/L — ABNORMAL LOW (ref 135–145)

## 2023-01-20 LAB — CBG MONITORING, ED: Glucose-Capillary: 100 mg/dL — ABNORMAL HIGH (ref 70–99)

## 2023-01-20 NOTE — ED Triage Notes (Signed)
 Patient is here for evaluation of dizziness, nausea and vomiting and near syncope today. EMS reports that she had a headache yesterday and woke up today having these new symptoms. Patient given 4mg  zofran  in route via EMS.

## 2023-01-21 DIAGNOSIS — E871 Hypo-osmolality and hyponatremia: Principal | ICD-10-CM

## 2023-01-21 DIAGNOSIS — F419 Anxiety disorder, unspecified: Secondary | ICD-10-CM | POA: Diagnosis present

## 2023-01-21 DIAGNOSIS — R197 Diarrhea, unspecified: Secondary | ICD-10-CM | POA: Diagnosis present

## 2023-01-21 DIAGNOSIS — E039 Hypothyroidism, unspecified: Secondary | ICD-10-CM | POA: Diagnosis present

## 2023-01-21 DIAGNOSIS — Z8249 Family history of ischemic heart disease and other diseases of the circulatory system: Secondary | ICD-10-CM | POA: Diagnosis not present

## 2023-01-21 DIAGNOSIS — E860 Dehydration: Secondary | ICD-10-CM | POA: Diagnosis present

## 2023-01-21 DIAGNOSIS — F32A Depression, unspecified: Secondary | ICD-10-CM | POA: Diagnosis present

## 2023-01-21 DIAGNOSIS — E876 Hypokalemia: Secondary | ICD-10-CM | POA: Diagnosis not present

## 2023-01-21 DIAGNOSIS — Z79890 Hormone replacement therapy (postmenopausal): Secondary | ICD-10-CM | POA: Diagnosis not present

## 2023-01-21 DIAGNOSIS — D509 Iron deficiency anemia, unspecified: Secondary | ICD-10-CM | POA: Diagnosis present

## 2023-01-21 DIAGNOSIS — Z9882 Breast implant status: Secondary | ICD-10-CM | POA: Diagnosis not present

## 2023-01-21 DIAGNOSIS — Z888 Allergy status to other drugs, medicaments and biological substances status: Secondary | ICD-10-CM | POA: Diagnosis not present

## 2023-01-21 LAB — BASIC METABOLIC PANEL
Anion gap: 11 (ref 5–15)
Anion gap: 7 (ref 5–15)
BUN: 10 mg/dL (ref 8–23)
BUN: 12 mg/dL (ref 8–23)
CO2: 23 mmol/L (ref 22–32)
CO2: 24 mmol/L (ref 22–32)
Calcium: 8.7 mg/dL — ABNORMAL LOW (ref 8.9–10.3)
Calcium: 8.2 mg/dL — ABNORMAL LOW (ref 8.9–10.3)
Chloride: 94 mmol/L — ABNORMAL LOW (ref 98–111)
Chloride: 104 mmol/L (ref 98–111)
Creatinine, Ser: 0.73 mg/dL (ref 0.44–1.00)
Creatinine, Ser: 0.76 mg/dL (ref 0.44–1.00)
GFR, Estimated: >60 mL/min (ref >60)
GFR, Estimated: >60 mL/min (ref >60)
Glucose, Bld: 92 mg/dL (ref 70–99)
Glucose, Bld: 104 mg/dL — ABNORMAL HIGH (ref 70–99)
Potassium: 3.4 mmol/L — ABNORMAL LOW (ref 3.5–5.1)
Potassium: 3.3 mmol/L — ABNORMAL LOW (ref 3.5–5.1)
Sodium: 128 mmol/L — ABNORMAL LOW (ref 135–145)
Sodium: 135 mmol/L (ref 135–145)

## 2023-01-21 LAB — CBC WITH DIFFERENTIAL/PLATELET
Abs Immature Granulocytes: 0.01 10*3/uL (ref 0.00–0.07)
Basophils Absolute: 0 10*3/uL (ref 0.0–0.1)
Basophils Relative: 0 %
Eosinophils Absolute: 0 10*3/uL (ref 0.0–0.5)
Eosinophils Relative: 0 %
HCT: 32.5 % — ABNORMAL LOW (ref 36.0–46.0)
Hemoglobin: 11.2 g/dL — ABNORMAL LOW (ref 12.0–15.0)
Immature Granulocytes: 0 %
Lymphocytes Relative: 20 %
Lymphs Abs: 0.7 10*3/uL (ref 0.7–4.0)
MCH: 24.9 pg — ABNORMAL LOW (ref 26.0–34.0)
MCHC: 34.5 g/dL (ref 30.0–36.0)
MCV: 72.4 fL — ABNORMAL LOW (ref 80.0–100.0)
Monocytes Absolute: 0.6 10*3/uL (ref 0.1–1.0)
Monocytes Relative: 16 %
Neutro Abs: 2.3 10*3/uL (ref 1.7–7.7)
Neutrophils Relative %: 64 %
Platelets: 170 10*3/uL (ref 150–400)
RBC: 4.49 MIL/uL (ref 3.87–5.11)
RDW: 13.7 % (ref 11.5–15.5)
WBC: 3.6 10*3/uL — ABNORMAL LOW (ref 4.0–10.5)
nRBC: 0 % (ref 0.0–0.2)

## 2023-01-21 LAB — OSMOLALITY: Osmolality: 268 mosm/kg — ABNORMAL LOW (ref 275–295)

## 2023-01-21 LAB — HIV ANTIBODY (ROUTINE TESTING W REFLEX): HIV Screen 4th Generation wRfx: NONREACTIVE

## 2023-01-21 LAB — TSH: TSH: 0.375 u[IU]/mL (ref 0.350–4.500)

## 2023-01-21 MED ORDER — POLYSACCHARIDE IRON COMPLEX 150 MG PO CAPS
150.0000 mg | ORAL_CAPSULE | Freq: Two times a day (BID) | ORAL | Status: DC
  Administered 2023-01-21 – 2023-01-22 (×2): 150 mg via ORAL
  Filled 2023-01-21 (×2): qty 1

## 2023-01-21 MED ORDER — LEVOTHYROXINE SODIUM 50 MCG PO TABS
50.0000 ug | ORAL_TABLET | ORAL | Status: DC
  Administered 2023-01-22: 50 ug via ORAL
  Filled 2023-01-21: qty 1

## 2023-01-21 MED ORDER — LEVOTHYROXINE SODIUM 100 MCG PO TABS: 100.0000 ug | ORAL_TABLET | ORAL | Status: DC

## 2023-01-21 MED ORDER — ONDANSETRON HCL 4 MG/2ML IJ SOLN
4.0000 mg | Freq: Four times a day (QID) | INTRAMUSCULAR | Status: DC | PRN
Start: 2023-01-21 — End: 2023-01-22

## 2023-01-21 MED ORDER — ACETAMINOPHEN 650 MG RE SUPP: 650.0000 mg | Freq: Four times a day (QID) | RECTAL | Status: DC | PRN

## 2023-01-21 MED ORDER — SODIUM CHLORIDE 0.9 % IV SOLN: INTRAVENOUS | Status: AC

## 2023-01-21 MED ORDER — SODIUM CHLORIDE 0.9 % IV BOLUS
1000.0000 mL | Freq: Once | INTRAVENOUS | Status: AC
  Administered 2023-01-21: 1000 mL via INTRAVENOUS

## 2023-01-21 MED ORDER — LORAZEPAM 0.5 MG PO TABS
0.2500 mg | ORAL_TABLET | Freq: Two times a day (BID) | ORAL | Status: DC | PRN
  Administered 2023-01-21 – 2023-01-22 (×2): 0.25 mg via ORAL
  Filled 2023-01-21 (×2): qty 1

## 2023-01-21 MED ORDER — TRAZODONE HCL 50 MG PO TABS: 25.0000 mg | ORAL_TABLET | Freq: Every evening | ORAL | Status: DC | PRN

## 2023-01-21 MED ORDER — ONDANSETRON HCL 4 MG PO TABS: 4.0000 mg | ORAL_TABLET | Freq: Four times a day (QID) | ORAL | Status: DC | PRN

## 2023-01-21 MED ORDER — ACETAMINOPHEN 325 MG PO TABS: 650.0000 mg | ORAL_TABLET | Freq: Four times a day (QID) | ORAL | Status: DC | PRN

## 2023-01-21 MED ORDER — ALBUTEROL SULFATE (2.5 MG/3ML) 0.083% IN NEBU: 2.5000 mg | INHALATION_SOLUTION | RESPIRATORY_TRACT | Status: DC | PRN

## 2023-01-21 MED ORDER — ENOXAPARIN SODIUM 30 MG/0.3ML IJ SOSY
30.0000 mg | PREFILLED_SYRINGE | INTRAMUSCULAR | Status: DC
  Administered 2023-01-21 – 2023-01-22 (×2): 30 mg via SUBCUTANEOUS
  Filled 2023-01-21 (×2): qty 0.3

## 2023-01-21 NOTE — H&P (Signed)
History and Physical  Lauren Bolton CBJ:[id number] DOB: [birth year] DOA: 01/20/2023  PCP: Adrian Prince, MD   Chief Complaint: Syncope, nausea  HPI: [name] is a 63 y.o. female with medical history significant for hypothyroidism, anxiety/depression, iron deficiency anemia being admitted to the hospital with complaints of syncope, vomiting and found to have severe hyponatremia.  History is provided by the patient, as well as my conversation with her husband over the phone.  They state that she has pretty much been in her usual state of health, though she has poor appetite according to her.  Patient's husband states that she drinks plenty of water, but does not think that she drinks fluids excessively.  Denies any recent new medications or changes in medication.  States that since yesterday, she was having a bit of a headache, feeling weak.  In the afternoon, her legs gave out and she lost consciousness.  Husband witnessed it, she was very diaphoretic and pale, was not confused when she woke up.  Husband denies any loss of bowel or bladder continence, or shaking.  She was brought to the emergency department, where she was found to have a sodium of 121, repeat this morning sodium 128.  Review of Systems: Please see HPI for pertinent positives and negatives. A complete 10 system review of systems are otherwise negative.  Past Medical History:  Diagnosis Date   Anxiety    Depression    Hypothyroidism    Iron deficiency anemia    Thyroid disease    Past Surgical History:  Procedure Laterality Date   BROW LIFT Bilateral 02/03/2022   Procedure: Blepharoplasty Upper Eyelids;  Surgeon: Santiago Glad, MD;  Location: [facility];  Service: Plastics;  Laterality: Bilateral;   ESOPHAGOGASTRODUODENOSCOPY N/A 11/13/2015   Procedure: ESOPHAGOGASTRODUODENOSCOPY (EGD);  Surgeon: Dorena Cookey, MD;  Location: [facility];  Service: Endoscopy;  Laterality: N/A;   ESOPHAGOGASTRODUODENOSCOPY  (EGD) WITH PROPOFOL N/A 12/26/2015   Procedure: ESOPHAGOGASTRODUODENOSCOPY (EGD) WITH PROPOFOL;  Surgeon: Bernette Redbird, MD;  Location: [facility];  Service: Endoscopy;  Laterality: N/A;   PLACEMENT OF BREAST IMPLANTS     20 years ago   thyroid goiter     Social History:  reports that she has never smoked. She has never used smokeless tobacco. She reports that she does not drink alcohol and does not use drugs.  Allergies  Allergen Reactions   Benadryl [Diphenhydramine Hcl] Diarrhea and Other (See Comments)    Reaction:  Makes her excessively sleepy    Alprazolam Diarrhea   Lactose Intolerance (Gi)     Family History  Problem Relation Age of Onset   Hypertension Mother    Thyroid disease Neg Hx      Prior to Admission medications   Medication Sig Start Date End Date Taking? Authorizing Provider  Carboxymethylcellul-Glycerin (CLEAR EYES FOR DRY EYES OP) Place 1 drop into both eyes daily as needed (dry eyes).    [provider]  Cholecalciferol (VITAMIN D PO) Take 1 tablet by mouth daily.    [provider]  FIBER ADULT GUMMIES PO Take 1 tablet by mouth daily.    [provider]  iron polysaccharides (NIFEREX) 150 MG capsule Take 1 capsule (150 mg total) by mouth 2 (two) times daily. 02/06/16   Adonis Brook, NP  levothyroxine (SYNTHROID) 100 MCG tablet Take 1 tablet (100 mcg total) by mouth daily before breakfast. 02/07/16   Adonis Brook, NP  LORazepam (ATIVAN) 0.5 MG tablet Take 0.5 tablets (0.25 mg total) by mouth  at bedtime as needed for anxiety. 11/20/21   Oletta Darter, MD  Omega-3 Fatty Acids (FISH OIL) 1000 MG CAPS Take 1 capsule by mouth.    [provider]  ondansetron (ZOFRAN-ODT) 4 MG disintegrating tablet Take 1 tablet (4 mg total) by mouth every 8 (eight) hours as needed for nausea or vomiting. 01/14/22   Lorelee New, PA-C    Physical Exam: BP (!) 147/91   Pulse 66   Temp 98.7 F (37.1 C) (Oral)   Resp 16   Ht 5' (1.524  m)   Wt 45.5 kg   SpO2 99%   BMI 19.59 kg/m  General:  Alert, oriented, calm, in no acute distress, looks dehydrated overall Cardiovascular: RRR, no murmurs or rubs, no peripheral edema  Respiratory: clear to auscultation bilaterally, no wheezes, no crackles  Abdomen: soft, nontender, nondistended, normal bowel tones heard  Skin: dry, no rashes  Musculoskeletal: no joint effusions, normal range of motion  Psychiatric: appropriate affect, normal speech  Neurologic: extraocular muscles intact, clear speech, moving all extremities with intact sensorium         Labs on Admission:  Basic Metabolic Panel: Recent Labs  Lab 01/20/23 1617 01/21/23 0918  NA 121* 128*  K 3.4* 3.4*  CL 89* 94*  CO2 21* 23  GLUCOSE 118* 92  BUN 9 10  CREATININE 0.61 0.73  CALCIUM 8.3* 8.7*   Liver Function Tests: No results for input(s): "AST", "ALT", "ALKPHOS", "BILITOT", "PROT", "ALBUMIN" in the last 168 hours. No results for input(s): "LIPASE", "AMYLASE" in the last 168 hours. No results for input(s): "AMMONIA" in the last 168 hours. CBC: Recent Labs  Lab 01/20/23 1617 01/21/23 0918  WBC 2.9* 3.6*  NEUTROABS  --  2.3  HGB 10.0* 11.2*  HCT 29.3* 32.5*  MCV 72.9* 72.4*  PLT 157 170   Cardiac Enzymes: No results for input(s): "CKTOTAL", "CKMB", "CKMBINDEX", "TROPONINI" in the last 168 hours. BNP (last 3 results) No results for input(s): "BNP" in the last 8760 hours.  ProBNP (last 3 results) No results for input(s): "PROBNP" in the last 8760 hours.  CBG: Recent Labs  Lab 01/20/23 1646  GLUCAP 100*   Radiological Exams on Admission: No results found.  Assessment/Plan Lauren Bolton is a 63 y.o. female with medical history significant for hypothyroidism, anxiety/depression, iron deficiency anemia being admitted to the hospital with complaints of syncope, vomiting and found to have severe hyponatremia.  Severe hyponatremia-initial sodium 121, now improved to 128.  Likely the source of  her headache, vomiting, and perhaps her syncope.  Etiology is unclear at this time, her TSH is at goal, does not appear to be on any inciting medications.  She does have a history of anxiety and depression, and admits to very poor appetite so I suspect this is related to dehydration.  I do not have a high suspicion for psychogenic polydipsia. -Observation admission with telemetry -Continue IV normal saline infusion, and monitor sodium levels at least every 8 hours  Syncope-suspect this is related to severe hyponatremia/weakness, no seizure-like activity reported, no palpitations, no chest pain. -Monitor on telemetry for any arrhythmia, EKG personally reviewed normal sinus rhythm  Hypothyroidism-TSH at goal, continue Synthroid at current home dosing  Anxiety/depression-I suspect this is contributing to her poor appetite and dehydration. -Continue home dose Ativan 0.25 mg p.o. twice daily as needed  Iron deficiency anemia-continue Niferex, hemoglobin at baseline  DVT prophylaxis: Lovenox     Code Status: Full Code  Consults called: None  Admission status:  Observation  Time spent: 59 minutes  Niva Murren Sharlette Dense MD [facility] Pager [phone]  If 7PM-7AM, please contact night-coverage [email or web address] Password [facility]  01/21/2023, 10:38 AM

## 2023-01-21 NOTE — Plan of Care (Signed)
  Problem: Education: Goal: Knowledge of General Education information will improve Description: Including pain rating scale, medication(s)/side effects and non-pharmacologic comfort measures Outcome: Progressing   Problem: Clinical Measurements: Goal: Will remain free from infection Outcome: Progressing Goal: Diagnostic test results will improve Outcome: Progressing Goal: Respiratory complications will improve Outcome: Progressing   

## 2023-01-21 NOTE — ED Notes (Addendum)
Pt speaks english and [place], she declines Nurse, learning disability.  Pt describes that she has had intermittent dizziness and some generalized weakness in her legs, mostly when she wakes up in the am.  Pt and family also note that pt has had several syncopal episodes. Pt is not having these symptoms at this time.  Pt is alert and oriented, no focal weakness.

## 2023-01-21 NOTE — ED Notes (Signed)
Call to lab to add serum osmolarity

## 2023-01-21 NOTE — ED Provider Notes (Signed)
[place] EMERGENCY DEPARTMENT AT [facility] Provider Note   CSN: [id number] Arrival date & time: 01/20/23  1557     History  Chief Complaint  Patient presents with   Dizziness   Near Syncope   Emesis    Lauren Bolton is a 63 y.o. female.  With a history of hypothyroidism, iron deficiency anemia and malnutrition who presents to the ED for generalized weakness.  2 days of increasing generalized weakness and near syncope episodes at home.  Unable to stand without help from her husband.  Vomited twice yesterday.  No focal neurologic deficits fevers chills chest pain or shortness of breath.  Husband voices concern for poor p.o. intake at home over the last couple days   Dizziness Associated symptoms: vomiting   Near Syncope  Emesis      Home Medications Prior to Admission medications   Medication Sig Start Date End Date Taking? Authorizing Provider  Carboxymethylcellul-Glycerin (CLEAR EYES FOR DRY EYES OP) Place 1 drop into both eyes daily as needed (dry eyes).   Yes [provider]  Cholecalciferol (VITAMIN D PO) Take 2,000 Units by mouth daily.   Yes [provider]  FIBER ADULT GUMMIES PO Take 1 tablet by mouth daily.   Yes [provider]  iron polysaccharides (NIFEREX) 150 MG capsule Take 1 capsule (150 mg total) by mouth 2 (two) times daily. 02/06/16  Yes Adonis Brook, NP  levothyroxine (SYNTHROID) 100 MCG tablet Take 1 tablet (100 mcg total) by mouth daily before breakfast. Patient taking differently: Take 50-100 mcg by mouth See admin instructions. Take 100 mcg by mouth on Monday, Tuesday, Wednesday, and Thursday. Take 50 mcg on Friday. Skip Saturday and Sunday. 02/07/16  Yes Adonis Brook, NP  LORazepam (ATIVAN) 0.5 MG tablet Take 0.5 tablets (0.25 mg total) by mouth at bedtime as needed for anxiety. Patient taking differently: Take 0.5 mg by mouth in the morning and at bedtime. 11/20/21  Yes Oletta Darter, MD  Omega-3 Fatty Acids  (FISH OIL) 1000 MG CAPS Take 1 capsule by mouth.   Yes [provider]  ondansetron (ZOFRAN-ODT) 4 MG disintegrating tablet Take 1 tablet (4 mg total) by mouth every 8 (eight) hours as needed for nausea or vomiting. Patient not taking: Reported on 01/21/2023 01/14/22   Lorelee New, PA-C  rosuvastatin (CRESTOR) 5 MG tablet Take 5 mg by mouth 2 (two) times a week. Patient not taking: Reported on 01/21/2023 11/11/22   [provider]      Allergies    Benadryl [diphenhydramine hcl], Alprazolam, and Lactose intolerance (gi)    Review of Systems   Review of Systems  Cardiovascular:  Positive for near-syncope.  Gastrointestinal:  Positive for vomiting.  Neurological:  Positive for dizziness.    Physical Exam Updated Vital Signs BP (!) 147/91   Pulse 66   Temp 98.7 F (37.1 C) (Oral)   Resp 16   Ht 5' (1.524 m)   Wt 45.5 kg   SpO2 99%   BMI 19.59 kg/m  Physical Exam Vitals and nursing note reviewed.  HENT:     Head: Normocephalic and atraumatic.  Eyes:     Pupils: Pupils are equal, round, and reactive to light.  Cardiovascular:     Rate and Rhythm: Normal rate and regular rhythm.  Pulmonary:     Effort: Pulmonary effort is normal.     Breath sounds: Normal breath sounds.  Abdominal:     Palpations: Abdomen is soft.     Tenderness:  There is no abdominal tenderness.  Skin:    General: Skin is warm and dry.  Neurological:     General: No focal deficit present.     Mental Status: She is alert.     Sensory: No sensory deficit.     Motor: No weakness.  Psychiatric:        Mood and Affect: Mood normal.     ED Results / Procedures / Treatments   Labs (all labs ordered are listed, but only abnormal results are displayed) Labs Reviewed  BASIC METABOLIC PANEL - Abnormal; Notable for the following components:      Result Value   Sodium 121 (*)    Potassium 3.4 (*)    Chloride 89 (*)    CO2 21 (*)    Glucose, Bld 118 (*)    Calcium 8.3 (*)    All  other components within normal limits  CBC - Abnormal; Notable for the following components:   WBC 2.9 (*)    Hemoglobin 10.0 (*)    HCT 29.3 (*)    MCV 72.9 (*)    MCH 24.9 (*)    All other components within normal limits  URINALYSIS, ROUTINE W REFLEX MICROSCOPIC - Abnormal; Notable for the following components:   Color, Urine COLORLESS (*)    Specific Gravity, Urine 1.003 (*)    Hgb urine dipstick SMALL (*)    Ketones, ur 5 (*)    All other components within normal limits  CBC WITH DIFFERENTIAL/PLATELET - Abnormal; Notable for the following components:   WBC 3.6 (*)    Hemoglobin 11.2 (*)    HCT 32.5 (*)    MCV 72.4 (*)    MCH 24.9 (*)    All other components within normal limits  BASIC METABOLIC PANEL - Abnormal; Notable for the following components:   Sodium 128 (*)    Potassium 3.4 (*)    Chloride 94 (*)    Calcium 8.7 (*)    All other components within normal limits  CBG MONITORING, ED - Abnormal; Notable for the following components:   Glucose-Capillary 100 (*)    All other components within normal limits  TSH  OSMOLALITY  HIV ANTIBODY (ROUTINE TESTING W REFLEX)  BASIC METABOLIC PANEL    EKG EKG Interpretation Date/Time:  Wednesday January 20 2023 16:19:42 EST Ventricular Rate:  74 PR Interval:  128 QRS Duration:  95 QT Interval:  432 QTC Calculation: 480 R Axis:   78  Text Interpretation: Sinus rhythm RSR' in V1 or V2, probably normal variant No significant change was found Confirmed by Drema Pry [phone]) on 01/21/2023 4:50:18 AM  Radiology No results found.  Procedures Procedures    Medications Ordered in ED Medications  enoxaparin (LOVENOX) injection 30 mg (has no administration in time range)  acetaminophen (TYLENOL) tablet 650 mg (has no administration in time range)    Or  acetaminophen (TYLENOL) suppository 650 mg (has no administration in time range)  traZODone (DESYREL) tablet 25 mg (has no administration in time range)  ondansetron  (ZOFRAN) tablet 4 mg (has no administration in time range)    Or  ondansetron (ZOFRAN) injection 4 mg (has no administration in time range)  albuterol (PROVENTIL) (2.5 MG/3ML) 0.083% nebulizer solution 2.5 mg (has no administration in time range)  0.9 %  sodium chloride infusion (has no administration in time range)  LORazepam (ATIVAN) tablet 0.25 mg (has no administration in time range)  levothyroxine (SYNTHROID) tablet 100 mcg (has no administration in time range)  iron polysaccharides (NIFEREX) capsule 150 mg (has no administration in time range)  levothyroxine (SYNTHROID) tablet 50 mcg (has no administration in time range)  sodium chloride 0.9 % bolus 1,000 mL (1,000 mLs Intravenous New Bag/Given 01/21/23 0920)    ED Course/ Medical Decision Making/ A&P                                 Medical Decision Making 63 year old female with history as above presenting for generalized weakness vomiting with near syncope at home.  Afebrile and hemodynamically stable.  Initial lab work drawn here yesterday evening revealed hyponatremia of 121.  She waited all night in the waiting room and was put back to her room this morning.  Hyponatremia slightly improved but persistent along with her weakness.  Will admit to medicine for further evaluation of hyponatremia.  Suspect this is due to chronic malnutrition and poor p.o. intake with couple episodes of vomiting yesterday.  Amount and/or Complexity of Data Reviewed Labs: ordered.  Risk Decision regarding hospitalization.           Final Clinical Impression(s) / ED Diagnoses Final diagnoses:  Hyponatremia  Dehydration  Near syncope  Nausea and vomiting, unspecified vomiting type    Rx / DC Orders ED Discharge Orders     None         Royanne Foots, DO 01/21/23 1107

## 2023-01-21 NOTE — ED Notes (Signed)
ED TO INPATIENT HANDOFF REPORT  ED Nurse Name and Phone #: Gearlean Alf Name/Age/Gender Lauren Bolton 63 y.o. female Room/Bed: WA25/WA25  Code Status   Code Status: Full Code  Home/SNF/Other Home Patient oriented to: self, place, time, and situation Is this baseline? Yes   Triage Complete: Triage complete  Chief Complaint Hyponatremia [E87.1]  Triage Note Patient is here for evaluation of dizziness, nausea and vomiting and near syncope today. EMS reports that she had a headache yesterday and woke up today having these new symptoms. Patient given 4mg  zofran in route via EMS.   Allergies Allergies  Allergen Reactions   Benadryl [Diphenhydramine Hcl] Diarrhea and Other (See Comments)    Reaction:  Makes her excessively sleepy    Alprazolam Diarrhea   Lactose Intolerance (Gi)     Level of Care/Admitting Diagnosis ED Disposition     ED Disposition  Admit   Condition  --   Comment  Hospital Area: [facility] [[id number]]  Level of Care: Telemetry [5]  Admit to tele based on following criteria: Eval of Syncope  May admit patient to [facility] or [facility] if equivalent level of care is available:: Yes  Covid Evaluation: Asymptomatic - no recent exposure (last 10 days) testing not required  Diagnosis: Hyponatremia [198519]  Admitting Physician: Maryln Gottron [[id number]]  Attending Physician: Kirby Crigler, Parks Neptune [[id number]]  Certification:: I certify this patient will need inpatient services for at least 2 midnights  Expected Medical Readiness: 01/23/2023          B Medical/Surgery History Past Medical History:  Diagnosis Date   Anxiety    Depression    Hypothyroidism    Iron deficiency anemia    Thyroid disease    Past Surgical History:  Procedure Laterality Date   BROW LIFT Bilateral 02/03/2022   Procedure: Blepharoplasty Upper Eyelids;  Surgeon: Santiago Glad, MD;  Location: [facility];  Service: Plastics;  Laterality:  Bilateral;   ESOPHAGOGASTRODUODENOSCOPY N/A 11/13/2015   Procedure: ESOPHAGOGASTRODUODENOSCOPY (EGD);  Surgeon: Dorena Cookey, MD;  Location: [facility];  Service: Endoscopy;  Laterality: N/A;   ESOPHAGOGASTRODUODENOSCOPY (EGD) WITH PROPOFOL N/A 12/26/2015   Procedure: ESOPHAGOGASTRODUODENOSCOPY (EGD) WITH PROPOFOL;  Surgeon: Bernette Redbird, MD;  Location: [facility];  Service: Endoscopy;  Laterality: N/A;   PLACEMENT OF BREAST IMPLANTS     20 years ago   thyroid goiter       A IV Location/Drains/Wounds Patient Lines/Drains/Airways Status     Active Line/Drains/Airways     Name Placement date Placement time Site Days   Peripheral IV 01/20/23 20 G Left Antecubital 01/20/23  1700  Antecubital  1            Intake/Output Last 24 hours  Intake/Output Summary (Last 24 hours) at 01/21/2023 1410 Last data filed at 01/21/2023 1201 Gross per 24 hour  Intake 1000.66 ml  Output --  Net 1000.66 ml    Labs/Imaging Results for orders placed or performed during the hospital encounter of 01/20/23 (from the past 48 hours)  Basic metabolic panel     Status: Abnormal   Collection Time: 01/20/23  4:17 PM  Result Value Ref Range   Sodium 121 (L) 135 - 145 mmol/L   Potassium 3.4 (L) 3.5 - 5.1 mmol/L   Chloride 89 (L) 98 - 111 mmol/L   CO2 21 (L) 22 - 32 mmol/L   Glucose, Bld 118 (H) 70 - 99 mg/dL    Comment: Glucose reference range applies only to samples  taken after fasting for at least 8 hours.   BUN 9 8 - 23 mg/dL   Creatinine, Ser 1.61 0.44 - 1.00 mg/dL   Calcium 8.3 (L) 8.9 - 10.3 mg/dL   GFR, Estimated >09 >60 mL/min    Comment: (NOTE) Calculated using the CKD-EPI Creatinine Equation (2021)    Anion gap 11 5 - 15    Comment: Performed at [facility], [address], [place], [place] [address]  CBC     Status: Abnormal   Collection Time: 01/20/23  4:17 PM  Result Value Ref Range   WBC 2.9 (L) 4.0 - 10.5 K/uL   RBC 4.02 3.87 - 5.11 MIL/uL   Hemoglobin 10.0  (L) 12.0 - 15.0 g/dL   HCT 81.1 (L) 91.4 - 78.2 %   MCV 72.9 (L) 80.0 - 100.0 fL   MCH 24.9 (L) 26.0 - 34.0 pg   MCHC 34.1 30.0 - 36.0 g/dL   RDW 95.6 21.3 - 08.6 %   Platelets 157 150 - 400 K/uL   nRBC 0.0 0.0 - 0.2 %    Comment: Performed at [facility], [address] [place], [place] [address]  CBG monitoring, ED     Status: Abnormal   Collection Time: 01/20/23  4:46 PM  Result Value Ref Range   Glucose-Capillary 100 (H) 70 - 99 mg/dL    Comment: Glucose reference range applies only to samples taken after fasting for at least 8 hours.  Urinalysis, Routine w reflex microscopic -Urine, Clean Catch     Status: Abnormal   Collection Time: 01/20/23 10:00 PM  Result Value Ref Range   Color, Urine COLORLESS (A) YELLOW   APPearance CLEAR CLEAR   Specific Gravity, Urine 1.003 (L) 1.005 - 1.030   pH 6.0 5.0 - 8.0   Glucose, UA NEGATIVE NEGATIVE mg/dL   Hgb urine dipstick SMALL (A) NEGATIVE   Bilirubin Urine NEGATIVE NEGATIVE   Ketones, ur 5 (A) NEGATIVE mg/dL   Protein, ur NEGATIVE NEGATIVE mg/dL   Nitrite NEGATIVE NEGATIVE   Leukocytes,Ua NEGATIVE NEGATIVE   RBC / HPF 0-5 0 - 5 RBC/hpf   WBC, UA 0-5 0 - 5 WBC/hpf   Bacteria, UA NONE SEEN NONE SEEN   Squamous Epithelial / HPF 0-5 0 - 5 /HPF    Comment: Performed at [facility], [address] [place], [place] [address]  CBC with Differential     Status: Abnormal   Collection Time: 01/21/23  9:18 AM  Result Value Ref Range   WBC 3.6 (L) 4.0 - 10.5 K/uL   RBC 4.49 3.87 - 5.11 MIL/uL   Hemoglobin 11.2 (L) 12.0 - 15.0 g/dL   HCT 28.4 (L) 13.2 - 44.0 %   MCV 72.4 (L) 80.0 - 100.0 fL   MCH 24.9 (L) 26.0 - 34.0 pg   MCHC 34.5 30.0 - 36.0 g/dL   RDW 10.2 72.5 - 36.6 %   Platelets 170 150 - 400 K/uL   nRBC 0.0 0.0 - 0.2 %   Neutrophils Relative % 64 %   Neutro Abs 2.3 1.7 - 7.7 K/uL   Lymphocytes Relative 20 %   Lymphs Abs 0.7 0.7 - 4.0 K/uL   Monocytes Relative 16 %   Monocytes Absolute  0.6 0.1 - 1.0 K/uL   Eosinophils Relative 0 %   Eosinophils Absolute 0.0 0.0 - 0.5 K/uL   Basophils Relative 0 %   Basophils Absolute 0.0 0.0 - 0.1 K/uL   Immature Granulocytes  0 %   Abs Immature Granulocytes 0.01 0.00 - 0.07 K/uL    Comment: Performed at [facility], [address], [place], [place] [address]  Basic metabolic panel     Status: Abnormal   Collection Time: 01/21/23  9:18 AM  Result Value Ref Range   Sodium 128 (L) 135 - 145 mmol/L    Comment: DELTA CHECK NOTED   Potassium 3.4 (L) 3.5 - 5.1 mmol/L   Chloride 94 (L) 98 - 111 mmol/L   CO2 23 22 - 32 mmol/L   Glucose, Bld 92 70 - 99 mg/dL    Comment: Glucose reference range applies only to samples taken after fasting for at least 8 hours.   BUN 10 8 - 23 mg/dL   Creatinine, Ser 1.91 0.44 - 1.00 mg/dL   Calcium 8.7 (L) 8.9 - 10.3 mg/dL   GFR, Estimated >47 >82 mL/min    Comment: (NOTE) Calculated using the CKD-EPI Creatinine Equation (2021)    Anion gap 11 5 - 15    Comment: Performed at [facility], [address] [place], [place] [address]  TSH     Status: None   Collection Time: 01/21/23  9:30 AM  Result Value Ref Range   TSH 0.375 0.350 - 4.500 uIU/mL    Comment: Performed by a 3rd Generation assay with a functional sensitivity of <=0.01 uIU/mL. Performed at [facility], [address], [place], [place] [address]    No results found.  Pending Labs Unresulted Labs (From admission, onward)     Start     Ordered   01/22/23 0500  Basic metabolic panel  Tomorrow morning,   R        01/21/23 1037   01/22/23 0500  CBC  Tomorrow morning,   R        01/21/23 1037   01/21/23 1900  Basic metabolic panel  Once,   R        01/21/23 1037   01/21/23 1036  HIV Antibody (routine testing w rflx)  (HIV Antibody (Routine testing w reflex) panel)  Once,   R        01/21/23 1037   01/21/23 1014  Osmolality  Once,   URGENT        01/21/23 1013             Vitals/Pain Today's Vitals   01/21/23 0900 01/21/23 1000 01/21/23 1200 01/21/23 1307  BP: (!) 147/91 (!) 144/81 (!) 141/80 (!) 145/82  Pulse: 66  74 71  Resp: 16 18 (!) 21 20  Temp:   99.6 F (37.6 C) 99.1 F (37.3 C)  TempSrc:   Oral Oral  SpO2: 99%  100% 100%  Weight:      Height:      PainSc:   0-No pain     Isolation Precautions No active isolations  Medications Medications  enoxaparin (LOVENOX) injection 30 mg (has no administration in time range)  acetaminophen (TYLENOL) tablet 650 mg (has no administration in time range)    Or  acetaminophen (TYLENOL) suppository 650 mg (has no administration in time range)  traZODone (DESYREL) tablet 25 mg (has no administration in time range)  ondansetron (ZOFRAN) tablet 4 mg (has no administration in time range)    Or  ondansetron (ZOFRAN) injection 4 mg (has no administration in time range)  albuterol (PROVENTIL) (2.5 MG/3ML) 0.083% nebulizer solution 2.5 mg (has no administration in time range)  0.9 %  sodium chloride infusion (  Intravenous Rate/Dose Verify 01/21/23 1205)  LORazepam (ATIVAN) tablet 0.25 mg (has no administration in time range)  levothyroxine (SYNTHROID) tablet 100 mcg (has no administration in time range)  iron polysaccharides (NIFEREX) capsule 150 mg (has no administration in time range)  levothyroxine (SYNTHROID) tablet 50 mcg (has no administration in time range)  sodium chloride 0.9 % bolus 1,000 mL (0 mLs Intravenous Stopped 01/21/23 1127)    Mobility walks     Focused Assessments    R Recommendations: See Admitting Provider Note  Report given to:   Additional Notes:

## 2023-01-22 DIAGNOSIS — E871 Hypo-osmolality and hyponatremia: Secondary | ICD-10-CM | POA: Diagnosis not present

## 2023-01-22 LAB — BASIC METABOLIC PANEL
Anion gap: 8 (ref 5–15)
Anion gap: 10 (ref 5–15)
BUN: 12 mg/dL (ref 8–23)
BUN: 11 mg/dL (ref 8–23)
CO2: 22 mmol/L (ref 22–32)
CO2: 22 mmol/L (ref 22–32)
Calcium: 8.3 mg/dL — ABNORMAL LOW (ref 8.9–10.3)
Calcium: 8.4 mg/dL — ABNORMAL LOW (ref 8.9–10.3)
Chloride: 107 mmol/L (ref 98–111)
Chloride: 103 mmol/L (ref 98–111)
Creatinine, Ser: 0.78 mg/dL (ref 0.44–1.00)
Creatinine, Ser: 0.66 mg/dL (ref 0.44–1.00)
GFR, Estimated: >60 mL/min (ref >60)
GFR, Estimated: >60 mL/min (ref >60)
Glucose, Bld: 91 mg/dL (ref 70–99)
Glucose, Bld: 88 mg/dL (ref 70–99)
Potassium: 3.1 mmol/L — ABNORMAL LOW (ref 3.5–5.1)
Potassium: 3 mmol/L — ABNORMAL LOW (ref 3.5–5.1)
Sodium: 137 mmol/L (ref 135–145)
Sodium: 135 mmol/L (ref 135–145)

## 2023-01-22 LAB — CBC
HCT: 29.9 % — ABNORMAL LOW (ref 36.0–46.0)
Hemoglobin: 9.9 g/dL — ABNORMAL LOW (ref 12.0–15.0)
MCH: 24.9 pg — ABNORMAL LOW (ref 26.0–34.0)
MCHC: 33.1 g/dL (ref 30.0–36.0)
MCV: 75.1 fL — ABNORMAL LOW (ref 80.0–100.0)
Platelets: 155 10*3/uL (ref 150–400)
RBC: 3.98 MIL/uL (ref 3.87–5.11)
RDW: 14 % (ref 11.5–15.5)
WBC: 3.2 10*3/uL — ABNORMAL LOW (ref 4.0–10.5)
nRBC: 0 % (ref 0.0–0.2)

## 2023-01-22 MED ORDER — ONDANSETRON 4 MG PO TBDP: 4.0000 mg | ORAL_TABLET | Freq: Three times a day (TID) | ORAL | 0 refills | Status: AC | PRN

## 2023-01-22 MED ORDER — POTASSIUM CHLORIDE CRYS ER 20 MEQ PO TBCR
40.0000 meq | EXTENDED_RELEASE_TABLET | ORAL | Status: AC
  Administered 2023-01-22 (×2): 40 meq via ORAL
  Filled 2023-01-22 (×2): qty 2

## 2023-01-22 NOTE — Progress Notes (Signed)
AVS reviewed w/ pt and her husband - interpretor reused by pt- pt speaks ESL. Both verbalized an understanding. Central tele notified of tele removal due to pt d/c. PIV removed as noted. Pt dressing for d/c to home. To main lobby via w/c - home w/ spouse.

## 2023-01-22 NOTE — Plan of Care (Signed)
  Problem: Education: Goal: Knowledge of General Education information will improve Description: Including pain rating scale, medication(s)/side effects and non-pharmacologic comfort measures Outcome: Completed/Met   Problem: Activity: Goal: Risk for activity intolerance will decrease Outcome: Completed/Met

## 2023-01-22 NOTE — Progress Notes (Signed)
   01/22/23 0844  TOC Brief Assessment  Insurance and Status Reviewed  Patient has primary care physician No  Home environment has been reviewed Resides in single family home with spouse and children  Prior level of function: Independent with ADLs at baseline  Prior/Current Home Services No current home services  Social Drivers of Health Review SDOH reviewed no interventions necessary  Readmission risk has been reviewed Yes  Transition of care needs no transition of care needs at this time

## 2023-01-22 NOTE — Plan of Care (Signed)
  Problem: Education: Goal: Knowledge of General Education information will improve Description: Including pain rating scale, medication(s)/side effects and non-pharmacologic comfort measures Outcome: Progressing   Problem: Clinical Measurements: Goal: Ability to maintain clinical measurements within normal limits will improve Outcome: Progressing Goal: Diagnostic test results will improve Outcome: Progressing   Problem: Activity: Goal: Risk for activity intolerance will decrease Outcome: Progressing   Problem: Nutrition: Goal: Adequate nutrition will be maintained Outcome: Progressing   Problem: Coping: Goal: Level of anxiety will decrease Outcome: Progressing

## 2023-01-22 NOTE — Discharge Summary (Signed)
Physician Discharge Summary  Lauren Bolton XLK:[id number] DOB: 1960/09/11 DOA: 01/20/2023  PCP: Adrian Prince, MD  Admit date: 01/20/2023 Discharge date: 01/22/2023    Discharge Diagnoses:   Principal Problem:   Hyponatremia  Active Problems:    Nausea with vomiting and diarrhea   Decreased oral intake   Hypothyroidism   Iron deficiency anemia    Hospital Course:  63 year old female being admitted, anxiety and depression who has been having vomiting and diarrhea for few days and presents with syncope. In the ED, she was found to have a sodium of 121 and felt to be dehydrated.  Principal Problem:   Hyponatremia -IV fluids given-sodium has improved to 135  Vomiting and diarrhea - Suspect GI illness - Last episodes were yesterday-she is no longer nauseated and eating and drinking much better today  Hypokalemia - Likely secondary to GI - Have been replacing  Low WBC count -WBC count is about 3.2 -Possibly secondary to viral illness  - Can follow-up as outpatient          Discharge Instructions  Discharge Instructions     Diet - low sodium heart healthy   Complete by: As directed    Increase activity slowly   Complete by: As directed       Allergies as of 01/22/2023       Reactions   Benadryl [diphenhydramine Hcl] Diarrhea, Other (See Comments)   Reaction:  Makes her excessively sleepy    Alprazolam Diarrhea   Lactose Intolerance (gi)         Medication List     TAKE these medications    CLEAR EYES FOR DRY EYES OP Place 1 drop into both eyes daily as needed (dry eyes).   FIBER ADULT GUMMIES PO Take 1 tablet by mouth daily.   Fish Oil 1000 MG Caps Take 1 capsule by mouth.   iron polysaccharides 150 MG capsule Commonly known as: NIFEREX Take 1 capsule (150 mg total) by mouth 2 (two) times daily.   levothyroxine 100 MCG tablet Commonly known as: Synthroid Take 1 tablet (100 mcg total) by mouth daily before breakfast. What changed:  how  much to take when to take this additional instructions   LORazepam 0.5 MG tablet Commonly known as: ATIVAN Take 0.5 tablets (0.25 mg total) by mouth at bedtime as needed for anxiety. What changed:  how much to take when to take this   ondansetron 4 MG disintegrating tablet Commonly known as: ZOFRAN-ODT Take 1 tablet (4 mg total) by mouth every 8 (eight) hours as needed for nausea or vomiting.   rosuvastatin 5 MG tablet Commonly known as: CRESTOR Take 5 mg by mouth 2 (two) times a week.   VITAMIN D PO Take 2,000 Units by mouth daily.            The results of significant diagnostics from this hospitalization (including imaging, microbiology, ancillary and laboratory) are listed below for reference.    No results found. Labs:   Basic Metabolic Panel: Recent Labs  Lab 01/20/23 1617 01/21/23 0918 01/21/23 1640 01/22/23 0540 01/22/23 0823  NA 121* 128* 135 137 135  K 3.4* 3.4* 3.3* 3.1* 3.0*  CL 89* 94* 104 107 103  CO2 21* 23 24 22 22   GLUCOSE 118* 92 104* 91 88  BUN 9 10 12 12 11   CREATININE 0.61 0.73 0.76 0.78 0.66  CALCIUM 8.3* 8.7* 8.2* 8.3* 8.4*     CBC: Recent Labs  Lab 01/20/23 1617 01/21/23 0918 01/22/23 0540  WBC  2.9* 3.6* 3.2*  NEUTROABS  --  2.3  --   HGB 10.0* 11.2* 9.9*  HCT 29.3* 32.5* 29.9*  MCV 72.9* 72.4* 75.1*  PLT 157 170 155         SIGNED:   Calvert Cantor, MD  [facility] 01/22/2023, 2:32 PM

## 2023-01-26 DIAGNOSIS — R197 Diarrhea, unspecified: Secondary | ICD-10-CM | POA: Diagnosis not present

## 2023-01-26 DIAGNOSIS — E871 Hypo-osmolality and hyponatremia: Secondary | ICD-10-CM | POA: Diagnosis not present

## 2023-01-26 DIAGNOSIS — E876 Hypokalemia: Secondary | ICD-10-CM | POA: Diagnosis not present

## 2023-01-26 DIAGNOSIS — K529 Noninfective gastroenteritis and colitis, unspecified: Secondary | ICD-10-CM | POA: Diagnosis not present

## 2023-02-25 ENCOUNTER — Emergency Department (HOSPITAL_COMMUNITY): Payer: Medicare Other

## 2023-02-25 ENCOUNTER — Observation Stay (HOSPITAL_COMMUNITY)
Admission: EM | Admit: 2023-02-25 | Discharge: 2023-03-01 | Disposition: A | Discharge status: Home / Self Care | Payer: Medicare Other | Attending: Family Medicine | Admitting: Family Medicine

## 2023-02-25 ENCOUNTER — Other Ambulatory Visit: Payer: Self-pay

## 2023-02-25 ENCOUNTER — Encounter (HOSPITAL_COMMUNITY): Payer: Self-pay | Admitting: Emergency Medicine

## 2023-02-25 DIAGNOSIS — E039 Hypothyroidism, unspecified: Secondary | ICD-10-CM | POA: Insufficient documentation

## 2023-02-25 DIAGNOSIS — E876 Hypokalemia: Secondary | ICD-10-CM | POA: Diagnosis not present

## 2023-02-25 DIAGNOSIS — B9681 Helicobacter pylori [H. pylori] as the cause of diseases classified elsewhere: Secondary | ICD-10-CM | POA: Insufficient documentation

## 2023-02-25 DIAGNOSIS — I700 Atherosclerosis of aorta: Secondary | ICD-10-CM | POA: Diagnosis not present

## 2023-02-25 DIAGNOSIS — R531 Weakness: Secondary | ICD-10-CM | POA: Diagnosis not present

## 2023-02-25 DIAGNOSIS — Z79899 Other long term (current) drug therapy: Secondary | ICD-10-CM | POA: Insufficient documentation

## 2023-02-25 DIAGNOSIS — F32A Depression, unspecified: Secondary | ICD-10-CM | POA: Insufficient documentation

## 2023-02-25 DIAGNOSIS — R112 Nausea with vomiting, unspecified: Secondary | ICD-10-CM | POA: Diagnosis not present

## 2023-02-25 DIAGNOSIS — R634 Abnormal weight loss: Secondary | ICD-10-CM | POA: Diagnosis not present

## 2023-02-25 DIAGNOSIS — F419 Anxiety disorder, unspecified: Secondary | ICD-10-CM | POA: Insufficient documentation

## 2023-02-25 DIAGNOSIS — F331 Major depressive disorder, recurrent, moderate: Secondary | ICD-10-CM

## 2023-02-25 DIAGNOSIS — D649 Anemia, unspecified: Secondary | ICD-10-CM | POA: Diagnosis not present

## 2023-02-25 DIAGNOSIS — F0631 Mood disorder due to known physiological condition with depressive features: Secondary | ICD-10-CM

## 2023-02-25 DIAGNOSIS — R1084 Generalized abdominal pain: Secondary | ICD-10-CM | POA: Diagnosis not present

## 2023-02-25 DIAGNOSIS — F411 Generalized anxiety disorder: Secondary | ICD-10-CM

## 2023-02-25 DIAGNOSIS — R1013 Epigastric pain: Secondary | ICD-10-CM | POA: Diagnosis not present

## 2023-02-25 DIAGNOSIS — K2950 Unspecified chronic gastritis without bleeding: Secondary | ICD-10-CM | POA: Diagnosis not present

## 2023-02-25 DIAGNOSIS — E639 Nutritional deficiency, unspecified: Secondary | ICD-10-CM | POA: Insufficient documentation

## 2023-02-25 DIAGNOSIS — K922 Gastrointestinal hemorrhage, unspecified: Secondary | ICD-10-CM | POA: Diagnosis not present

## 2023-02-25 DIAGNOSIS — D509 Iron deficiency anemia, unspecified: Secondary | ICD-10-CM | POA: Diagnosis not present

## 2023-02-25 LAB — COMPREHENSIVE METABOLIC PANEL
ALT: 41 U/L (ref 0–44)
AST: 35 U/L (ref 15–41)
Albumin: 4 g/dL (ref 3.5–5.0)
Alkaline Phosphatase: 77 U/L (ref 38–126)
Anion gap: 12 (ref 5–15)
BUN: 7 mg/dL — ABNORMAL LOW (ref 8–23)
CO2: 22 mmol/L (ref 22–32)
Calcium: 9.1 mg/dL (ref 8.9–10.3)
Chloride: 104 mmol/L (ref 98–111)
Creatinine, Ser: 0.83 mg/dL (ref 0.44–1.00)
GFR, Estimated: >60 mL/min (ref >60)
Glucose, Bld: 116 mg/dL — ABNORMAL HIGH (ref 70–99)
Potassium: 3.1 mmol/L — ABNORMAL LOW (ref 3.5–5.1)
Sodium: 138 mmol/L (ref 135–145)
Total Bilirubin: 0.6 mg/dL (ref 0.0–1.2)
Total Protein: 7.6 g/dL (ref 6.5–8.1)

## 2023-02-25 LAB — APTT: aPTT: 28 s (ref 24–36)

## 2023-02-25 LAB — CBC
HCT: 18.3 % — ABNORMAL LOW (ref 36.0–46.0)
Hemoglobin: 5.8 g/dL — CL (ref 12.0–15.0)
MCH: 24.7 pg — ABNORMAL LOW (ref 26.0–34.0)
MCHC: 31.7 g/dL (ref 30.0–36.0)
MCV: 77.9 fL — ABNORMAL LOW (ref 80.0–100.0)
Platelets: 108 10*3/uL — ABNORMAL LOW (ref 150–400)
RBC: 2.35 MIL/uL — ABNORMAL LOW (ref 3.87–5.11)
RDW: 14.3 % (ref 11.5–15.5)
WBC: 2.8 10*3/uL — ABNORMAL LOW (ref 4.0–10.5)
nRBC: 0 % (ref 0.0–0.2)

## 2023-02-25 LAB — URINALYSIS, ROUTINE W REFLEX MICROSCOPIC
Bilirubin Urine: NEGATIVE
Glucose, UA: NEGATIVE mg/dL
Hgb urine dipstick: NEGATIVE
Ketones, ur: NEGATIVE mg/dL
Leukocytes,Ua: NEGATIVE
Nitrite: NEGATIVE
Protein, ur: NEGATIVE mg/dL
Specific Gravity, Urine: 1.012 (ref 1.005–1.030)
pH: 7 (ref 5.0–8.0)

## 2023-02-25 LAB — PROTIME-INR
INR: 1 (ref 0.8–1.2)
Prothrombin Time: 13.1 s (ref 11.4–15.2)

## 2023-02-25 LAB — LIPASE, BLOOD: Lipase: 29 U/L (ref 11–51)

## 2023-02-25 LAB — ABO/RH: ABO/RH(D): O POS

## 2023-02-25 LAB — PREPARE RBC (CROSSMATCH)

## 2023-02-25 MED ORDER — POTASSIUM CHLORIDE 20 MEQ PO PACK
40.0000 meq | PACK | Freq: Once | ORAL | Status: AC
  Administered 2023-02-25: 40 meq via ORAL
  Filled 2023-02-25: qty 2

## 2023-02-25 MED ORDER — SODIUM CHLORIDE 0.9% IV SOLUTION: Freq: Once | INTRAVENOUS | Status: AC

## 2023-02-25 MED ORDER — VITAMIN D 25 MCG (1000 UNIT) PO TABS
2000.0000 [IU] | ORAL_TABLET | Freq: Every day | ORAL | Status: DC
  Administered 2023-02-26 – 2023-03-01 (×4): 2000 [IU] via ORAL
  Filled 2023-02-25 (×4): qty 2

## 2023-02-25 MED ORDER — LEVOTHYROXINE SODIUM 100 MCG PO TABS
100.0000 ug | ORAL_TABLET | Freq: Every day | ORAL | Status: DC
Start: 2023-02-26 — End: 2023-02-26
  Administered 2023-02-26: 100 ug via ORAL
  Filled 2023-02-25: qty 1

## 2023-02-25 MED ORDER — POLYSACCHARIDE IRON COMPLEX 150 MG PO CAPS
150.0000 mg | ORAL_CAPSULE | Freq: Two times a day (BID) | ORAL | Status: DC
  Filled 2023-02-25: qty 1

## 2023-02-25 MED ORDER — IOHEXOL 350 MG/ML SOLN
100.0000 mL | Freq: Once | INTRAVENOUS | Status: AC | PRN
  Administered 2023-02-25: 100 mL via INTRAVENOUS

## 2023-02-25 NOTE — ED Provider Notes (Addendum)
[place] EMERGENCY DEPARTMENT AT [facility] Provider Note   CSN: [id number] Arrival date & time: 02/25/23  1707     History  Chief Complaint  Patient presents with   Emesis   Diarrhea    Lauren Bolton is a 63 y.o. female.  With a history of iron deficiency anemia, failure to thrive and malnutrition who presents to the ED for weakness.Patient was recently seen in the ED for generalized weakness and subsequently admitted for hyponatremia on January 15.  She was discharged back to home and since that time has had very poor p.o. intake.  She is only able to eat small amounts of porridge with salt and does not have an appetite.  Feels very weak with some nausea occasionally.  Some upper abdominal pain.  No vomiting, GI bleeding, diarrhea, fevers, chills or respiratory symptoms.  She is able to understand some English but predominantly speaks [place]   Emesis Associated symptoms: diarrhea   Diarrhea Associated symptoms: vomiting        Home Medications Prior to Admission medications   Medication Sig Start Date End Date Taking? Authorizing Provider  Carboxymethylcellul-Glycerin (CLEAR EYES FOR DRY EYES OP) Place 1 drop into both eyes daily as needed (dry eyes).    [provider]  Cholecalciferol (VITAMIN D PO) Take 2,000 Units by mouth daily.    [provider]  FIBER ADULT GUMMIES PO Take 1 tablet by mouth daily.    [provider]  iron polysaccharides (NIFEREX) 150 MG capsule Take 1 capsule (150 mg total) by mouth 2 (two) times daily. 02/06/16   Adonis Brook, NP  levothyroxine (SYNTHROID) 100 MCG tablet Take 1 tablet (100 mcg total) by mouth daily before breakfast. Patient taking differently: Take 50-100 mcg by mouth See admin instructions. Take 100 mcg by mouth on Monday, Tuesday, Wednesday, and Thursday. Take 50 mcg on Friday. Skip Saturday and Sunday. 02/07/16   Adonis Brook, NP  LORazepam (ATIVAN) 0.5 MG tablet Take 0.5 tablets (0.25 mg  total) by mouth at bedtime as needed for anxiety. Patient taking differently: Take 0.5 mg by mouth in the morning and at bedtime. 11/20/21   Oletta Darter, MD  Omega-3 Fatty Acids (FISH OIL) 1000 MG CAPS Take 1 capsule by mouth.    [provider]  ondansetron (ZOFRAN-ODT) 4 MG disintegrating tablet Take 1 tablet (4 mg total) by mouth every 8 (eight) hours as needed for nausea or vomiting. 01/22/23   Calvert Cantor, MD  rosuvastatin (CRESTOR) 5 MG tablet Take 5 mg by mouth 2 (two) times a week. Patient not taking: Reported on 01/21/2023 11/11/22   [provider]      Allergies    Benadryl [diphenhydramine hcl], Alprazolam, and Lactose intolerance (gi)    Review of Systems   Review of Systems  Gastrointestinal:  Positive for diarrhea and vomiting.    Physical Exam Updated Vital Signs BP (!) 164/80   Pulse 85   Temp 97.6 F (36.4 C) (Oral)   Resp 15   SpO2 100%  Physical Exam Vitals and nursing note reviewed.  HENT:     Head: Normocephalic and atraumatic.  Eyes:     Pupils: Pupils are equal, round, and reactive to light.  Cardiovascular:     Rate and Rhythm: Normal rate and regular rhythm.  Pulmonary:     Effort: Pulmonary effort is normal.     Breath sounds: Normal breath sounds.  Abdominal:     Palpations: Abdomen is soft.  Tenderness: There is abdominal tenderness.     Comments: Mild epigastric tenderness without rebound rigidity or guarding  Skin:    General: Skin is warm and dry.  Neurological:     Mental Status: She is alert.  Psychiatric:        Mood and Affect: Mood normal.     ED Results / Procedures / Treatments   Labs (all labs ordered are listed, but only abnormal results are displayed) Labs Reviewed  COMPREHENSIVE METABOLIC PANEL - Abnormal; Notable for the following components:      Result Value   Potassium 3.1 (*)    Glucose, Bld 116 (*)    BUN 7 (*)    All other components within normal limits  CBC - Abnormal; Notable for the  following components:   WBC 2.8 (*)    RBC 2.35 (*)    Hemoglobin 5.8 (*)    HCT 18.3 (*)    MCV 77.9 (*)    MCH 24.7 (*)    Platelets 108 (*)    All other components within normal limits  URINALYSIS, ROUTINE W REFLEX MICROSCOPIC - Abnormal; Notable for the following components:   Color, Urine COLORLESS (*)    All other components within normal limits  LIPASE, BLOOD  APTT  PROTIME-INR  TYPE AND SCREEN  PREPARE RBC (CROSSMATCH)    EKG None  Radiology CT Angio Abd/Pel W and/or Wo Contrast Result Date: 02/25/2023 CLINICAL DATA:  Lower GI bleed EXAM: CTA ABDOMEN AND PELVIS WITHOUT AND WITH CONTRAST TECHNIQUE: Multidetector CT imaging of the abdomen and pelvis was performed using the standard protocol during bolus administration of intravenous contrast. Multiplanar reconstructed images and MIPs were obtained and reviewed to evaluate the vascular anatomy. RADIATION DOSE REDUCTION: This exam was performed according to the departmental dose-optimization program which includes automated exposure control, adjustment of the mA and/or kV according to patient size and/or use of iterative reconstruction technique. CONTRAST:  OMNIPAQUE IOHEXOL 350 MG/ML SOLN COMPARISON:  08/24/2022 FINDINGS: VASCULAR Aorta: Thoracic aorta was imaged. No aneurysm or dissection. Scattered atherosclerosis. Heart is normal size. Abdominal aorta demonstrates scattered calcifications. No aneurysm or dissection. Celiac: Patent without evidence of aneurysm, dissection, vasculitis or significant stenosis. SMA: Patent without evidence of aneurysm, dissection, vasculitis or significant stenosis. Renals: Both renal arteries are patent without evidence of aneurysm, dissection, vasculitis, fibromuscular dysplasia or significant stenosis. IMA: Patent without evidence of aneurysm, dissection, vasculitis or significant stenosis. Inflow: Patent without evidence of aneurysm, dissection, vasculitis or significant stenosis. Proximal  Outflow: Bilateral common femoral and visualized portions of the superficial and profunda femoral arteries are patent without evidence of aneurysm, dissection, vasculitis or significant stenosis. Veins: No obvious venous abnormality within the limitations of this arterial phase study. Review of the MIP images confirms the above findings. NON-VASCULAR Lower chest: Lungs clear.  No effusions. Hepatobiliary: No focal hepatic abnormality. Gallbladder unremarkable. Pancreas: No focal abnormality or ductal dilatation. Spleen: No focal abnormality.  Normal size. Adrenals/Urinary Tract: No adrenal abnormality. No focal renal abnormality. No stones or hydronephrosis. Urinary bladder is unremarkable. Stomach/Bowel: No contrast extravasation to localize GI bleed. Stomach, large and small bowel grossly unremarkable. Normal appendix. Lymphatic: No adenopathy Reproductive: Uterus and adnexa unremarkable.  No mass. Other: No free fluid or free air. Musculoskeletal: No acute bony abnormality. IMPRESSION: VASCULAR No evidence of aortic aneurysm or dissection. Scattered aortic atherosclerosis. No contrast extravasation to localize GI bleed. NON-VASCULAR No acute findings in the chest, abdomen or pelvis. Electronically Signed   By: Charlett Nose M.D.  On: 02/25/2023 19:20    Procedures .Critical Care  Performed by: Royanne Foots, DO Authorized by: Royanne Foots, DO   Critical care provider statement:    Critical care time (minutes):  30   Critical care was necessary to treat or prevent imminent or life-threatening deterioration of the following conditions:  Metabolic crisis   Critical care was time spent personally by me on the following activities:  Development of treatment plan with patient or surrogate, discussions with consultants, evaluation of patient's response to treatment, examination of patient, ordering and review of laboratory studies, ordering and review of radiographic studies, ordering and performing  treatments and interventions, pulse oximetry, re-evaluation of patient's condition and review of old charts   I assumed direction of critical care for this patient from another provider in my specialty: no     Care discussed with: admitting provider       Medications Ordered in ED Medications  0.9 %  sodium chloride infusion (Manually program via Guardrails IV Fluids) (has no administration in time range)  potassium chloride (KLOR-CON) packet 40 mEq (40 mEq Oral Given 02/25/23 1931)  iohexol (OMNIPAQUE) 350 MG/ML injection 100 mL (100 mLs Intravenous Contrast Given 02/25/23 1905)    ED Course/ Medical Decision Making/ A&P Clinical Course as of 02/25/23 2000  Thu Feb 25, 2023  1840 Laboratory workup notable for critical hemoglobin of 5.8.  Along with hypokalemia.  Will provide oral potassium repletion and obtain type and screen plan to transfuse.  Will obtain CTA to evaluate for potential GI bleeding although low suspicion for brisk bleed.  She does have a history of anemia and this may be due to poor nutritional intake with MCV of 77.9 on CBC tonight [MP]  1959 No acute intra-abdominal process no evidence of bleeding on CTA.  Considering her epigastric pain and anemia she would likely benefit from GI evaluation during her admission.  Discussed with admitting hospitalist who accepts patient for admission [MP]    Clinical Course User Index [MP] Royanne Foots, DO                                 Medical Decision Making 63 year old female with history as above returns for generalized weakness.  Recent admission for hyponatremia.  Very poor p.o. intake at home.  Some nausea and epigastric pain.  Afebrile and hypertensive here.  Epigastric tenderness on my exam.  Differential diagnosis includes electrolyte imbalance, failure to thrive, anemia, intra-abdominal infection  Amount and/or Complexity of Data Reviewed Labs: ordered. Radiology: ordered.  Risk Prescription drug management. Decision  regarding hospitalization.           Final Clinical Impression(s) / ED Diagnoses Final diagnoses:  Symptomatic anemia  Hypokalemia  Nutritional deficiency    Rx / DC Orders ED Discharge Orders     None         Royanne Foots, DO 02/25/23 1951    Royanne Foots, DO 02/25/23 2000

## 2023-02-25 NOTE — H&P (Incomplete)
History and Physical  Lauren Bolton ZOX:[id number] DOB: 18-Aug-1960 DOA: 02/25/2023  Referring physician: Dr. Elayne Snare, EDP  PCP: Adrian Prince, MD  Outpatient Specialists: Behavioral health. Patient coming from: Home  Interviewed using video interpreter Lauren Bolton [phone]  Chief Complaint: Generalized weakness.  HPI: Lauren Bolton is a 63 y.o. female with medical history significant for chronic anxiety, failure to thrive in an adult, hypothyroidism, iron deficiency anemia, who presents to the ER due to worsening generalized weakness.  Associated with very poor oral intake for the past month and 8 pounds unintentional weight loss.  Denies having any dark stools, blood in the stool, or hematemesis.  Admits to a occasional Advil use for minor aches and pain.  Denies any shortness of breath at rest.  States she has a burning sensation in her chest.  In the ER, tachypneic with hemoglobin of 5.8 on presentation.  1 unit PRBCs ordered to be transfused by EDP.  Admitted by [facility], hospitalist service.  To note the patient has a history of upper endoscopy done in 2017 by Dr. Matthias Hughs which was unrevealing.  Due to concern for possible upper GI bleed IV Protonix twice daily was started.  The patient denies any overt bleeding.  ED Course: Temperature 98.5.  BP 133/69, pulse 73, respiratory 18, O2 saturation 99% on room air.  Review of Systems: Review of systems as noted in the HPI. All other systems reviewed and are negative.   Past Medical History:  Diagnosis Date   Anxiety    Depression    Hypothyroidism    Iron deficiency anemia    Thyroid disease    Past Surgical History:  Procedure Laterality Date   BROW LIFT Bilateral 02/03/2022   Procedure: Blepharoplasty Upper Eyelids;  Surgeon: Santiago Glad, MD;  Location: [facility];  Service: Plastics;  Laterality: Bilateral;   ESOPHAGOGASTRODUODENOSCOPY N/A 11/13/2015   Procedure: ESOPHAGOGASTRODUODENOSCOPY (EGD);  Surgeon: Dorena Cookey,  MD;  Location: [facility];  Service: Endoscopy;  Laterality: N/A;   ESOPHAGOGASTRODUODENOSCOPY (EGD) WITH PROPOFOL N/A 12/26/2015   Procedure: ESOPHAGOGASTRODUODENOSCOPY (EGD) WITH PROPOFOL;  Surgeon: Bernette Redbird, MD;  Location: [facility];  Service: Endoscopy;  Laterality: N/A;   PLACEMENT OF BREAST IMPLANTS     20 years ago   thyroid goiter      Social History:  reports that she has never smoked. She has never used smokeless tobacco. She reports that she does not drink alcohol and does not use drugs.   Allergies  Allergen Reactions   Benadryl [Diphenhydramine Hcl] Diarrhea and Other (See Comments)    Reaction:  Makes her excessively sleepy    Alprazolam Diarrhea   Lactose Intolerance (Gi)     Family History  Problem Relation Age of Onset   Hypertension Mother    Thyroid disease Neg Hx       Prior to Admission medications   Medication Sig Start Date End Date Taking? Authorizing Provider  Carboxymethylcellul-Glycerin (CLEAR EYES FOR DRY EYES OP) Place 1 drop into both eyes daily as needed (dry eyes).    [provider]  Cholecalciferol (VITAMIN D PO) Take 2,000 Units by mouth daily.    [provider]  FIBER ADULT GUMMIES PO Take 1 tablet by mouth daily.    [provider]  iron polysaccharides (NIFEREX) 150 MG capsule Take 1 capsule (150 mg total) by mouth 2 (two) times daily. 02/06/16   Adonis Brook, NP  levothyroxine (SYNTHROID) 100 MCG tablet Take 1 tablet (100 mcg total) by mouth daily before breakfast.  Patient taking differently: Take 50-100 mcg by mouth See admin instructions. Take 100 mcg by mouth on Monday, Tuesday, Wednesday, and Thursday. Take 50 mcg on Friday. Skip Saturday and Sunday. 02/07/16   Adonis Brook, NP  LORazepam (ATIVAN) 0.5 MG tablet Take 0.5 tablets (0.25 mg total) by mouth at bedtime as needed for anxiety. Patient taking differently: Take 0.5 mg by mouth in the morning and at bedtime. 11/20/21   Oletta Darter, MD   Omega-3 Fatty Acids (FISH OIL) 1000 MG CAPS Take 1 capsule by mouth.    [provider]  ondansetron (ZOFRAN-ODT) 4 MG disintegrating tablet Take 1 tablet (4 mg total) by mouth every 8 (eight) hours as needed for nausea or vomiting. 01/22/23   Calvert Cantor, MD  rosuvastatin (CRESTOR) 5 MG tablet Take 5 mg by mouth 2 (two) times a week. Patient not taking: Reported on 01/21/2023 11/11/22   [provider]    Physical Exam: BP (!) 138/98   Pulse 72   Temp 97.6 F (36.4 C) (Oral)   Resp 16   SpO2 100%   General: 63 y.o. year-old female frail-appearing in no acute distress.  Alert and oriented x3. Cardiovascular: Regular rate and rhythm with no rubs or gallops.  No thyromegaly or JVD noted.  No lower extremity edema. 2/4 pulses in all 4 extremities. Respiratory: Clear to auscultation with no wheezes or rales. Good inspiratory effort. Abdomen: Soft nontender nondistended with normal bowel sounds x4 quadrants. Muskuloskeletal: No cyanosis, clubbing or edema noted bilaterally Neuro: CN II-XII intact, strength, sensation, reflexes Skin: No ulcerative lesions noted or rashes Psychiatry: Judgement and insight appear normal. Mood is appropriate for condition and setting          Labs on Admission:  Basic Metabolic Panel: Recent Labs  Lab 02/25/23 1732  NA 138  K 3.1*  CL 104  CO2 22  GLUCOSE 116*  BUN 7*  CREATININE 0.83  CALCIUM 9.1   Liver Function Tests: Recent Labs  Lab 02/25/23 1732  AST 35  ALT 41  ALKPHOS 77  BILITOT 0.6  PROT 7.6  ALBUMIN 4.0   Recent Labs  Lab 02/25/23 1732  LIPASE 29   No results for input(s): "AMMONIA" in the last 168 hours. CBC: Recent Labs  Lab 02/25/23 1732  WBC 2.8*  HGB 5.8*  HCT 18.3*  MCV 77.9*  PLT 108*   Cardiac Enzymes: No results for input(s): "CKTOTAL", "CKMB", "CKMBINDEX", "TROPONINI" in the last 168 hours.  BNP (last 3 results) No results for input(s): "BNP" in the last 8760 hours.  ProBNP (last 3  results) No results for input(s): "PROBNP" in the last 8760 hours.  CBG: No results for input(s): "GLUCAP" in the last 168 hours.  Radiological Exams on Admission: CT Angio Abd/Pel W and/or Wo Contrast Result Date: 02/25/2023 CLINICAL DATA:  Lower GI bleed EXAM: CTA ABDOMEN AND PELVIS WITHOUT AND WITH CONTRAST TECHNIQUE: Multidetector CT imaging of the abdomen and pelvis was performed using the standard protocol during bolus administration of intravenous contrast. Multiplanar reconstructed images and MIPs were obtained and reviewed to evaluate the vascular anatomy. RADIATION DOSE REDUCTION: This exam was performed according to the departmental dose-optimization program which includes automated exposure control, adjustment of the mA and/or kV according to patient size and/or use of iterative reconstruction technique. CONTRAST:  OMNIPAQUE IOHEXOL 350 MG/ML SOLN COMPARISON:  08/24/2022 FINDINGS: VASCULAR Aorta: Thoracic aorta was imaged. No aneurysm or dissection. Scattered atherosclerosis. Heart is normal size. Abdominal aorta demonstrates scattered calcifications. No aneurysm  or dissection. Celiac: Patent without evidence of aneurysm, dissection, vasculitis or significant stenosis. SMA: Patent without evidence of aneurysm, dissection, vasculitis or significant stenosis. Renals: Both renal arteries are patent without evidence of aneurysm, dissection, vasculitis, fibromuscular dysplasia or significant stenosis. IMA: Patent without evidence of aneurysm, dissection, vasculitis or significant stenosis. Inflow: Patent without evidence of aneurysm, dissection, vasculitis or significant stenosis. Proximal Outflow: Bilateral common femoral and visualized portions of the superficial and profunda femoral arteries are patent without evidence of aneurysm, dissection, vasculitis or significant stenosis. Veins: No obvious venous abnormality within the limitations of this arterial phase study. Review of the MIP images  confirms the above findings. NON-VASCULAR Lower chest: Lungs clear.  No effusions. Hepatobiliary: No focal hepatic abnormality. Gallbladder unremarkable. Pancreas: No focal abnormality or ductal dilatation. Spleen: No focal abnormality.  Normal size. Adrenals/Urinary Tract: No adrenal abnormality. No focal renal abnormality. No stones or hydronephrosis. Urinary bladder is unremarkable. Stomach/Bowel: No contrast extravasation to localize GI bleed. Stomach, large and small bowel grossly unremarkable. Normal appendix. Lymphatic: No adenopathy Reproductive: Uterus and adnexa unremarkable.  No mass. Other: No free fluid or free air. Musculoskeletal: No acute bony abnormality. IMPRESSION: VASCULAR No evidence of aortic aneurysm or dissection. Scattered aortic atherosclerosis. No contrast extravasation to localize GI bleed. NON-VASCULAR No acute findings in the chest, abdomen or pelvis. Electronically Signed   By: Charlett Nose M.D.   On: 02/25/2023 19:20    EKG: I independently viewed the EKG done and my findings are as followed: None available at the time of this visit.  Assessment/Plan Present on Admission:  Symptomatic anemia  Principal Problem:   Symptomatic anemia  Symptomatic anemia Hemoglobin 5.8 on presentation 1 unit PRBCs ordered to be transfused Repeat CBC post blood transfusion Denies overt bleeding Use of NSAIDs occasionally IV PPI twice daily until upper GI bleed is ruled out in this 63 year old female. [facility] consulted to assist with anemia workup. Follow iron studies and reticulocyte count  Failure to thrive in adult Very poor appetite and poor oral intake 8 pounds unintentional weight loss in 1 month Dietitian consulted to assist High risk of refeeding syndrome  Hypokalemia Repleted intravenously Check magnesium level  Moderate protein calorie malnutrition Low BMI with weight of 45 kg Encourage oral intake  Hypothyroidism Obtain TSH level Resume home  levothyroxine  Chronic anxiety Denies feeling depressed currently. Resume home regimen.   Critical care time: 55 minutes.   DVT prophylaxis: SCDs  Code Status: Full code  Family Communication: Husband at bedside.  Disposition Plan: Admitted to progressive care unit.  Consults called: [facility], Dr. Lorenso Quarry  Admission status: Inpatient status.   Status is: Inpatient The patient requires at least 2 midnights for further evaluation and treatment of present condition.   Darlin Drop MD [facility] Pager [phone]  If 7PM-7AM, please contact night-coverage [email or web address] Password [facility]  02/25/2023, 8:17 PM

## 2023-02-25 NOTE — ED Triage Notes (Signed)
BIB EMS from home with N/V/D x 2 days.  Some abdominal pain. No orthostatic changes with EMS.  Warm to touch with EMS.  650 mg of tylenol and 4 mg zofran IV 20G LAC.

## 2023-02-26 DIAGNOSIS — R634 Abnormal weight loss: Secondary | ICD-10-CM | POA: Diagnosis not present

## 2023-02-26 DIAGNOSIS — D649 Anemia, unspecified: Secondary | ICD-10-CM | POA: Diagnosis not present

## 2023-02-26 LAB — BPAM RBC
Blood Product Expiration Date: 202503182359
ISSUE DATE / TIME: 202502202247
Unit Type and Rh: 5100

## 2023-02-26 LAB — RETICULOCYTES
Immature Retic Fract: 9.9 % (ref 2.3–15.9)
RBC.: 4.77 MIL/uL (ref 3.87–5.11)
Retic Count, Absolute: 57.2 10*3/uL (ref 19.0–186.0)
Retic Ct Pct: 1.2 % (ref 0.4–3.1)

## 2023-02-26 LAB — TYPE AND SCREEN
ABO/RH(D): O POS
Antibody Screen: NEGATIVE
Unit division: 0

## 2023-02-26 LAB — IRON AND TIBC
Iron: 146 ug/dL (ref 28–170)
Saturation Ratios: 40 % — ABNORMAL HIGH (ref 10.4–31.8)
TIBC: 368 ug/dL (ref 250–450)
UIBC: 222 ug/dL

## 2023-02-26 LAB — HEMOGLOBIN AND HEMATOCRIT, BLOOD
HCT: 40.2 % (ref 36.0–46.0)
HCT: 39.1 % (ref 36.0–46.0)
Hemoglobin: 13.6 g/dL (ref 12.0–15.0)
Hemoglobin: 12.9 g/dL (ref 12.0–15.0)

## 2023-02-26 LAB — TSH: TSH: 13.654 u[IU]/mL — ABNORMAL HIGH (ref 0.350–4.500)

## 2023-02-26 LAB — T4, FREE: Free T4: 1.17 ng/dL — ABNORMAL HIGH (ref 0.61–1.12)

## 2023-02-26 LAB — FOLATE: Folate: 14.2 ng/mL (ref >5.9)

## 2023-02-26 LAB — FERRITIN: Ferritin: 447 ng/mL — ABNORMAL HIGH (ref 11–307)

## 2023-02-26 LAB — VITAMIN B12: Vitamin B-12: 598 pg/mL (ref 180–914)

## 2023-02-26 MED ORDER — ACETAMINOPHEN 325 MG PO TABS
650.0000 mg | ORAL_TABLET | Freq: Four times a day (QID) | ORAL | Status: DC | PRN
  Administered 2023-02-26 – 2023-02-27 (×2): 650 mg via ORAL
  Filled 2023-02-26 (×2): qty 2

## 2023-02-26 MED ORDER — POLYSACCHARIDE IRON COMPLEX 150 MG PO CAPS
150.0000 mg | ORAL_CAPSULE | Freq: Two times a day (BID) | ORAL | Status: DC
  Administered 2023-02-27 – 2023-03-01 (×5): 150 mg via ORAL
  Filled 2023-02-26 (×5): qty 1

## 2023-02-26 MED ORDER — POLYETHYLENE GLYCOL 3350 17 G PO PACK: 17.0000 g | PACK | Freq: Every day | ORAL | Status: DC | PRN

## 2023-02-26 MED ORDER — LORAZEPAM 0.5 MG PO TABS
0.5000 mg | ORAL_TABLET | Freq: Three times a day (TID) | ORAL | Status: DC | PRN
  Administered 2023-02-26 – 2023-03-01 (×8): 0.5 mg via ORAL
  Filled 2023-02-26 (×8): qty 1

## 2023-02-26 MED ORDER — SODIUM CHLORIDE 0.9 % IV SOLN: INTRAVENOUS | Status: AC

## 2023-02-26 MED ORDER — LEVOTHYROXINE SODIUM 75 MCG PO TABS
75.0000 ug | ORAL_TABLET | Freq: Every day | ORAL | Status: DC
  Administered 2023-02-27: 75 ug via ORAL
  Filled 2023-02-26: qty 1

## 2023-02-26 MED ORDER — MELATONIN 5 MG PO TABS
5.0000 mg | ORAL_TABLET | Freq: Every evening | ORAL | Status: DC | PRN
  Administered 2023-02-26: 5 mg via ORAL
  Filled 2023-02-26 (×2): qty 1

## 2023-02-26 MED ORDER — POTASSIUM CHLORIDE 10 MEQ/100ML IV SOLN
10.0000 meq | INTRAVENOUS | Status: AC
  Administered 2023-02-26 (×2): 10 meq via INTRAVENOUS
  Filled 2023-02-26 (×2): qty 100

## 2023-02-26 MED ORDER — PANTOPRAZOLE SODIUM 40 MG IV SOLR
40.0000 mg | Freq: Two times a day (BID) | INTRAVENOUS | Status: DC
  Administered 2023-02-26 – 2023-03-01 (×7): 40 mg via INTRAVENOUS
  Filled 2023-02-26 (×7): qty 10

## 2023-02-26 MED ORDER — LORAZEPAM 0.5 MG PO TABS
0.2500 mg | ORAL_TABLET | Freq: Every evening | ORAL | Status: DC | PRN
  Administered 2023-02-26: 0.25 mg via ORAL
  Filled 2023-02-26: qty 1

## 2023-02-26 MED ORDER — PEG 3350-KCL-NA BICARB-NACL 420 G PO SOLR
4000.0000 mL | Freq: Once | ORAL | Status: AC
  Administered 2023-02-26: 4000 mL via ORAL

## 2023-02-26 MED ORDER — POTASSIUM CHLORIDE 2 MEQ/ML IV SOLN
INTRAVENOUS | Status: DC
  Filled 2023-02-26 (×2): qty 1000

## 2023-02-26 MED ORDER — PROCHLORPERAZINE EDISYLATE 10 MG/2ML IJ SOLN
5.0000 mg | Freq: Four times a day (QID) | INTRAMUSCULAR | Status: DC | PRN
  Filled 2023-02-26: qty 2

## 2023-02-26 NOTE — Progress Notes (Signed)
PROGRESS NOTE    Lauren Bolton  WGN:[id number] DOB: 1960/10/09 DOA: 02/25/2023 PCP: Adrian Prince, MD  Chief Complaint  Patient presents with   Emesis   Diarrhea    Brief Narrative:   Lauren Bolton is [name] 63 y.o. female with medical history significant for chronic anxiety, failure to thrive in an adult, hypothyroidism, iron deficiency anemia, who presents to the ER due to worsening generalized weakness.   Assessment & Plan:   Principal Problem:   Symptomatic anemia  Symptomatic Anemia Hb at presentation 5.8, repeat 13 -> suspect Jaselyn Nahm spurious value -> repeat pending Labs c/w AOCD TSH shows hypothyroidism, needs uptitration of synthroid dose (will plan for 75 mcg daily) Appreciate GI assistance, plan for EGD/colonoscopy  Poor Appetite  Nausea  Weight Loss Appreciate GI assistance CT abd/pelvis without acute findings  Will consider CT chest  She notes nausea when she sees meat, but doesn't describe allergic reaction -> consider alpha gal testing, will hold off for now CRP, sed rate  Hypothyroidism Synthroid adjusted to 75 mcg daily (was previously only taking 100 mg Mon-Th, then 50 mcg Friday)  Anxiety Continue ativan      DVT prophylaxis: SCD Code Status: full Family Communication: husband at bedside Disposition:   Status is: Observation The patient remains OBS appropriate and will d/c before 2 midnights.   Consultants:  GI  Procedures:  none  Antimicrobials:  Anti-infectives (From admission, onward)    None       Subjective: C/o anxiety Intolerance of meat - just gets nauseous when she sees it  Objective: Vitals:   02/26/23 0608 02/26/23 1042 02/26/23 1301 02/26/23 1302  BP: 133/69 (!) 140/80 135/83   Pulse: 73 76 81 83  Resp: 18 18 18    Temp: 98.5 F (36.9 C) 98.5 F (36.9 C)  (!) 97.4 F (36.3 C)  TempSrc: Oral Oral  Oral  SpO2: 99% 100% 100% 100%    Intake/Output Summary (Last 24 hours) at 02/26/2023 1653 Last data filed at 02/26/2023  1500 Gross per 24 hour  Intake 454.41 ml  Output --  Net 454.41 ml   There were no vitals filed for this visit.  Examination:  General exam: Appears calm and comfortable  Respiratory system: unlabored Cardiovascular system: RRR Gastrointestinal system: Abdomen is nondistended, soft and nontender Central nervous system: Alert and oriented. No focal neurological deficits. Extremities: no LEE    Data Reviewed: I have personally reviewed following labs and imaging studies  CBC: Recent Labs  Lab 02/25/23 1732 02/26/23 1156  WBC 2.8*  --   HGB 5.8* 13.6  HCT 18.3* 40.2  MCV 77.9*  --   PLT 108*  --     Basic Metabolic Panel: Recent Labs  Lab 02/25/23 1732  NA 138  K 3.1*  CL 104  CO2 22  GLUCOSE 116*  BUN 7*  CREATININE 0.83  CALCIUM 9.1    GFR: CrCl cannot be calculated (Unknown ideal weight.).  Liver Function Tests: Recent Labs  Lab 02/25/23 1732  AST 35  ALT 41  ALKPHOS 77  BILITOT 0.6  PROT 7.6  ALBUMIN 4.0    CBG: No results for input(s): "GLUCAP" in the last 168 hours.   No results found for this or any previous visit (from the past 240 hours).       Radiology Studies: CT Angio Abd/Pel W and/or Wo Contrast Result Date: 02/25/2023 CLINICAL DATA:  Lower GI bleed EXAM: CTA ABDOMEN AND PELVIS WITHOUT AND WITH CONTRAST TECHNIQUE: Multidetector CT imaging of  the abdomen and pelvis was performed using the standard protocol during bolus administration of intravenous contrast. Multiplanar reconstructed images and MIPs were obtained and reviewed to evaluate the vascular anatomy. RADIATION DOSE REDUCTION: This exam was performed according to the departmental dose-optimization program which includes automated exposure control, adjustment of the mA and/or kV according to patient size and/or use of iterative reconstruction technique. CONTRAST:  OMNIPAQUE IOHEXOL 350 MG/ML SOLN COMPARISON:  08/24/2022 FINDINGS: VASCULAR Aorta: Thoracic aorta was  imaged. No aneurysm or dissection. Scattered atherosclerosis. Heart is normal size. Abdominal aorta demonstrates scattered calcifications. No aneurysm or dissection. Celiac: Patent without evidence of aneurysm, dissection, vasculitis or significant stenosis. SMA: Patent without evidence of aneurysm, dissection, vasculitis or significant stenosis. Renals: Both renal arteries are patent without evidence of aneurysm, dissection, vasculitis, fibromuscular dysplasia or significant stenosis. IMA: Patent without evidence of aneurysm, dissection, vasculitis or significant stenosis. Inflow: Patent without evidence of aneurysm, dissection, vasculitis or significant stenosis. Proximal Outflow: Bilateral common femoral and visualized portions of the superficial and profunda femoral arteries are patent without evidence of aneurysm, dissection, vasculitis or significant stenosis. Veins: No obvious venous abnormality within the limitations of this arterial phase study. Review of the MIP images confirms the above findings. NON-VASCULAR Lower chest: Lungs clear.  No effusions. Hepatobiliary: No focal hepatic abnormality. Gallbladder unremarkable. Pancreas: No focal abnormality or ductal dilatation. Spleen: No focal abnormality.  Normal size. Adrenals/Urinary Tract: No adrenal abnormality. No focal renal abnormality. No stones or hydronephrosis. Urinary bladder is unremarkable. Stomach/Bowel: No contrast extravasation to localize GI bleed. Stomach, large and small bowel grossly unremarkable. Normal appendix. Lymphatic: No adenopathy Reproductive: Uterus and adnexa unremarkable.  No mass. Other: No free fluid or free air. Musculoskeletal: No acute bony abnormality. IMPRESSION: VASCULAR No evidence of aortic aneurysm or dissection. Scattered aortic atherosclerosis. No contrast extravasation to localize GI bleed. NON-VASCULAR No acute findings in the chest, abdomen or pelvis. Electronically Signed   By: Charlett Nose M.D.   On:  02/25/2023 19:20        Scheduled Meds:  cholecalciferol  2,000 Units Oral Daily   [START ON 02/27/2023] iron polysaccharides  150 mg Oral BID   levothyroxine  100 mcg Oral Q0600   pantoprazole (PROTONIX) IV  40 mg Intravenous BID   polyethylene glycol-electrolytes  4,000 mL Oral Once   Continuous Infusions:  sodium chloride     lactated ringers 1,000 mL with potassium chloride 40 mEq infusion 50 mL/hr at 02/26/23 1142     LOS: 1 day    Time spent: over 30 min    Lacretia Nicks, MD [facility]   To contact the attending provider between 7A-7P or the covering provider during after hours 7P-7A, please log into the web site [email or web address] and access using universal [place] password for that web site. If you do not have the password, please call the hospital operator.  02/26/2023, 4:53 PM

## 2023-02-26 NOTE — Consult Note (Signed)
[facility] Consult  Referring Provider: [facility]/Dr.Hall Primary Care Physician:  Adrian Prince, MD Primary Gastroenterologist: Dr.Buccini/Dr.Schooler(last seen in 2019)  Reason for Consultation: Anemia  HPI: Lauren Bolton is a 63 y.o. female, originally from [place] presented to the ER with generalized weakness. Patient reports that she has been losing about 8 pounds in the last 1 month due to very poor oral intake.  She feels she cannot eat a lot and has been consuming small amounts of rice and water which was verified by her husband present at bedside.  She denies difficulty swallowing or pain on swallowing but as per her husband she pockets food in her mouth and feels like she cannot push food down. She occasionally has diarrhea, has been taking iron and reports dark stools although she denies black stools or blood in her stool. She denies acid reflux, heartburn. 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 She denies smoking or use of alcohol. There is no family history of GI malignancy.  Previous GI workup: In 12/2021, she refused a colonoscopy as documented in office note. EGD, 12/2015, Dr. Matthias Hughs, nausea, vomiting, weight loss, food intolerance: Unremarkable, biopsies showed chronic inactive gastritis with intestinal metaplasia but no H. pylori and no celiac EGD, 11/2015, Dr. Madilyn Fireman, epigastric abdominal pain: Unremarkable EGD, heartburn, reflux, Dr. Bosie Clos 2011: Moderate chronic active H. pylori gastritis, patient was advised to take Prevpac for 14 days ?  Colonoscopy ,2000: Acute mucosal colitis?  Acute phase of inflammatory bowel disease versus infection, NSAIDs are self-limited  Past Medical History:  Diagnosis Date   Anxiety    Depression    Hypothyroidism    Iron deficiency anemia    Thyroid disease     Past Surgical History:  Procedure Laterality Date   BROW LIFT Bilateral 02/03/2022   Procedure: Blepharoplasty Upper Eyelids;  Surgeon: Santiago Glad,  MD;  Location: [facility];  Service: Plastics;  Laterality: Bilateral;   ESOPHAGOGASTRODUODENOSCOPY N/A 11/13/2015   Procedure: ESOPHAGOGASTRODUODENOSCOPY (EGD);  Surgeon: Dorena Cookey, MD;  Location: [facility];  Service: Endoscopy;  Laterality: N/A;   ESOPHAGOGASTRODUODENOSCOPY (EGD) WITH PROPOFOL N/A 12/26/2015   Procedure: ESOPHAGOGASTRODUODENOSCOPY (EGD) WITH PROPOFOL;  Surgeon: Bernette Redbird, MD;  Location: [facility];  Service: Endoscopy;  Laterality: N/A;   PLACEMENT OF BREAST IMPLANTS     20 years ago   thyroid goiter      Prior to Admission medications   Medication Sig Start Date End Date Taking? Authorizing Provider  Carboxymethylcellul-Glycerin (CLEAR EYES FOR DRY EYES OP) Place 1 drop into both eyes daily as needed (dry eyes).    [provider]  Cholecalciferol (VITAMIN D PO) Take 2,000 Units by mouth daily.    [provider]  FIBER ADULT GUMMIES PO Take 1 tablet by mouth daily.    [provider]  iron polysaccharides (NIFEREX) 150 MG capsule Take 1 capsule (150 mg total) by mouth 2 (two) times daily. 02/06/16   Adonis Brook, NP  levothyroxine (SYNTHROID) 100 MCG tablet Take 1 tablet (100 mcg total) by mouth daily before breakfast. Patient taking differently: Take 50-100 mcg by mouth See admin instructions. Take 100 mcg by mouth on Monday, Tuesday, Wednesday, and Thursday. Take 50 mcg on Friday. Skip Saturday and Sunday. 02/07/16   Adonis Brook, NP  LORazepam (ATIVAN) 0.5 MG tablet Take 0.5 tablets (0.25 mg total) by mouth at bedtime as needed for anxiety. Patient taking differently: Take 0.5 mg by mouth in the morning and at bedtime. 11/20/21   Oletta Darter, MD  Omega-3 Fatty Acids (FISH OIL) 1000 MG CAPS Take 1 capsule by mouth.    [provider]  ondansetron (ZOFRAN-ODT) 4 MG disintegrating tablet Take 1 tablet (4 mg total) by mouth every 8 (eight) hours as needed for nausea or vomiting. 01/22/23   Calvert Cantor, MD   rosuvastatin (CRESTOR) 5 MG tablet Take 5 mg by mouth 2 (two) times a week. 11/11/22   [provider]    Current Facility-Administered Medications  Medication Dose Route Frequency Provider Last Rate Last Admin   acetaminophen (TYLENOL) tablet 650 mg  650 mg Oral Q6H PRN Dow Adolph N, DO       cholecalciferol (VITAMIN D3) 25 MCG (1000 UNIT) tablet 2,000 Units  2,000 Units Oral Daily Dow Adolph N, DO       [START ON 02/27/2023] iron polysaccharides (NIFEREX) capsule 150 mg  150 mg Oral BID Hall, Carole N, DO       lactated ringers 1,000 mL with potassium chloride 40 mEq infusion   Intravenous Continuous Hall, Carole N, DO       levothyroxine (SYNTHROID) tablet 100 mcg  100 mcg Oral Q0600 Darlin Drop, DO   100 mcg at 02/26/23 0740   LORazepam (ATIVAN) tablet 0.25 mg  0.25 mg Oral QHS PRN Dow Adolph N, DO   0.25 mg at 02/26/23 0019   melatonin tablet 5 mg  5 mg Oral QHS PRN Dow Adolph N, DO       pantoprazole (PROTONIX) injection 40 mg  40 mg Intravenous BID Dow Adolph N, DO   40 mg at 02/26/23 0741   polyethylene glycol (MIRALAX / GLYCOLAX) packet 17 g  17 g Oral Daily PRN Dow Adolph N, DO       potassium chloride 10 mEq in 100 mL IVPB  10 mEq Intravenous Q1 Hr x 2 Darlin Drop, DO 100 mL/hr at 02/26/23 0804 10 mEq at 02/26/23 6045   prochlorperazine (COMPAZINE) injection 5 mg  5 mg Intravenous Q6H PRN Dow Adolph N, DO        Allergies as of 02/25/2023 - Review Complete 02/25/2023  Allergen Reaction Noted   Benadryl [diphenhydramine hcl] Diarrhea and Other (See Comments) 11/13/2015   Alprazolam Diarrhea 08/27/2015   Lactose intolerance (gi)  02/06/2016    Family History  Problem Relation Age of Onset   Hypertension Mother    Thyroid disease Neg Hx     Social History   Socioeconomic History   Marital status: Married    Spouse name: Not on file   Number of children: Not on file   Years of education: Not on file   Highest education level: Not on file   Occupational History   Not on file  Tobacco Use   Smoking status: Never   Smokeless tobacco: Never  Vaping Use   Vaping status: Never Used  Substance and Sexual Activity   Alcohol use: No   Drug use: No   Sexual activity: Not Currently    Birth control/protection: Post-menopausal  Other Topics Concern   Not on file  Social History Narrative   Not on file   Social Drivers of Health   Financial Resource Strain: Not on file  Food Insecurity: No Food Insecurity (02/26/2023)   Hunger Vital Sign    Worried About Running Out of Food in the Last Year: Never true    Ran Out of Food in the Last Year: Never true  Transportation Needs: No Transportation Needs (02/26/2023)   PRAPARE - Transportation  Lack of Transportation (Medical): No    Lack of Transportation (Non-Medical): No  Physical Activity: Not on file  Stress: Not on file  Social Connections: Not on file  Intimate Partner Violence: Not At Risk (02/26/2023)   Humiliation, Afraid, Rape, and Kick questionnaire    Fear of Current or Ex-Partner: No    Emotionally Abused: No    Physically Abused: No    Sexually Abused: No    Review of Systems:  She has history of anxiety disorder, hypothyroidism and goiter removal in the past. Otherwise review of systems as per HPI  Physical Exam: Vital signs in last 24 hours: Temp:  [97.6 F (36.4 C)-99.3 F (37.4 C)] 98.5 F (36.9 C) (02/21 0608) Pulse Rate:  [64-88] 73 (02/21 0608) Resp:  [13-27] 18 (02/21 0608) BP: (133-164)/(69-98) 133/69 (02/21 0608) SpO2:  [99 %-100 %] 99 % (02/21 0608) Last BM Date : 02/26/23  General: Anxious, petite, thin Head:  Normocephalic and atraumatic. Eyes:  Sclera clear, no icterus.  Mild pallor Ears:  Normal auditory acuity. Nose:  No deformity, discharge,  or lesions. Mouth:  No deformity or lesions.  Oropharynx pink & moist. Neck:  Supple; no masses or thyromegaly. Lungs:  Clear throughout to auscultation.   No wheezes, crackles, or rhonchi.  No acute distress. Heart:  Regular rate and rhythm; no murmurs, clicks, rubs,  or gallops. Extremities:  Without clubbing or edema. Neurologic:  Alert and  oriented x4;  grossly normal neurologically. Skin:  Intact without significant lesions or rashes. Psych:  Alert and cooperative. Normal mood and affect. Abdomen:  Soft, nontender and nondistended. No masses, hepatosplenomegaly or hernias noted. Normal bowel sounds, without guarding, and without rebound.         Lab Results: Recent Labs    02/25/23 1732  WBC 2.8*  HGB 5.8*  HCT 18.3*  PLT 108*   BMET Recent Labs    02/25/23 1732  NA 138  K 3.1*  CL 104  CO2 22  GLUCOSE 116*  BUN 7*  CREATININE 0.83  CALCIUM 9.1   LFT Recent Labs    02/25/23 1732  PROT 7.6  ALBUMIN 4.0  AST 35  ALT 41  ALKPHOS 77  BILITOT 0.6   PT/INR Recent Labs    02/25/23 1840  LABPROT 13.1  INR 1.0    Studies/Results: CT Angio Abd/Pel W and/or Wo Contrast Result Date: 02/25/2023 CLINICAL DATA:  Lower GI bleed EXAM: CTA ABDOMEN AND PELVIS WITHOUT AND WITH CONTRAST TECHNIQUE: Multidetector CT imaging of the abdomen and pelvis was performed using the standard protocol during bolus administration of intravenous contrast. Multiplanar reconstructed images and MIPs were obtained and reviewed to evaluate the vascular anatomy. RADIATION DOSE REDUCTION: This exam was performed according to the departmental dose-optimization program which includes automated exposure control, adjustment of the mA and/or kV according to patient size and/or use of iterative reconstruction technique. CONTRAST:  OMNIPAQUE IOHEXOL 350 MG/ML SOLN COMPARISON:  08/24/2022 FINDINGS: VASCULAR Aorta: Thoracic aorta was imaged. No aneurysm or dissection. Scattered atherosclerosis. Heart is normal size. Abdominal aorta demonstrates scattered calcifications. No aneurysm or dissection. Celiac: Patent without evidence of aneurysm, dissection, vasculitis or significant stenosis.  SMA: Patent without evidence of aneurysm, dissection, vasculitis or significant stenosis. Renals: Both renal arteries are patent without evidence of aneurysm, dissection, vasculitis, fibromuscular dysplasia or significant stenosis. IMA: Patent without evidence of aneurysm, dissection, vasculitis or significant stenosis. Inflow: Patent without evidence of aneurysm, dissection, vasculitis or significant stenosis. Proximal Outflow: Bilateral common femoral and  visualized portions of the superficial and profunda femoral arteries are patent without evidence of aneurysm, dissection, vasculitis or significant stenosis. Veins: No obvious venous abnormality within the limitations of this arterial phase study. Review of the MIP images confirms the above findings. NON-VASCULAR Lower chest: Lungs clear.  No effusions. Hepatobiliary: No focal hepatic abnormality. Gallbladder unremarkable. Pancreas: No focal abnormality or ductal dilatation. Spleen: No focal abnormality.  Normal size. Adrenals/Urinary Tract: No adrenal abnormality. No focal renal abnormality. No stones or hydronephrosis. Urinary bladder is unremarkable. Stomach/Bowel: No contrast extravasation to localize GI bleed. Stomach, large and small bowel grossly unremarkable. Normal appendix. Lymphatic: No adenopathy Reproductive: Uterus and adnexa unremarkable.  No mass. Other: No free fluid or free air. Musculoskeletal: No acute bony abnormality. IMPRESSION: VASCULAR No evidence of aortic aneurysm or dissection. Scattered aortic atherosclerosis. No contrast extravasation to localize GI bleed. NON-VASCULAR No acute findings in the chest, abdomen or pelvis. Electronically Signed   By: Charlett Nose M.D.   On: 02/25/2023 19:20    Impression: Hemoglobin 5.8 on presentation, received 1 unit PRBC transfusion, repeat hemoglobin 13.6?  Error Baseline hemoglobin between 9-11(9.9 on 01/22/2023 and 11.1 01/21/2023) Microcytosis, 77.9 Thrombocytopenia, 108 Hypokalemia,  3.1 Ferritin 447?  Appears after transfusion Normal B12 and folate levels  Hypothyroidism, TSH 13.654 CT angio abdomen and pelvis with and without contrast: No acute findings in chest abdomen or pelvis, no active GI bleed  Plan: Unclear cause of anemia and unintentional weight loss, could be nutritional versus related to anxiety versus GI blood loss. Will proceed with a diagnostic EGD and colonoscopy in a.m. tomorrow with Dr. Dulce Sellar. Discussed about the risks and the benefits of the procedure with the patient and husband at bedside. Will start on clear liquid diet and give colonic prep today.   LOS: 1 day   Kerin Salen, MD  02/26/2023, 8:28 AM

## 2023-02-26 NOTE — Plan of Care (Signed)
   Problem: Education: Goal: Knowledge of General Education information will improve Description: Including pain rating scale, medication(s)/side effects and non-pharmacologic comfort measures Outcome: Progressing   Problem: Coping: Goal: Level of anxiety will decrease Outcome: Progressing   Problem: Safety: Goal: Ability to remain free from injury will improve Outcome: Progressing

## 2023-02-26 NOTE — Care Management Obs Status (Signed)
MEDICARE OBSERVATION STATUS NOTIFICATION   Patient Details  Name: Lauren Bolton MRN: [id number] Date of Birth: 10/06/1960   Medicare Observation Status Notification Given:  Yes    Ewing Schlein, LCSW 02/26/2023, 3:50 PM

## 2023-02-26 NOTE — H&P (View-Only) (Signed)
 [facility] Consult  Referring Provider: [facility]/Dr.Hall Primary Care Physician:  Adrian Prince, MD Primary Gastroenterologist: Dr.Buccini/Dr.Schooler(last seen in 2019)  Reason for Consultation: Anemia  HPI: Lauren Bolton is a 63 y.o. female, originally from [place] presented to the ER with generalized weakness. Patient reports that she has been losing about 8 pounds in the last 1 month due to very poor oral intake.  She feels she cannot eat a lot and has been consuming small amounts of rice and water which was verified by her husband present at bedside.  She denies difficulty swallowing or pain on swallowing but as per her husband she pockets food in her mouth and feels like she cannot push food down. She occasionally has diarrhea, has been taking iron and reports dark stools although she denies black stools or blood in her stool. She denies acid reflux, heartburn. 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 She denies smoking or use of alcohol. There is no family history of GI malignancy.  Previous GI workup: In 12/2021, she refused a colonoscopy as documented in office note. EGD, 12/2015, Dr. Matthias Hughs, nausea, vomiting, weight loss, food intolerance: Unremarkable, biopsies showed chronic inactive gastritis with intestinal metaplasia but no H. pylori and no celiac EGD, 11/2015, Dr. Madilyn Fireman, epigastric abdominal pain: Unremarkable EGD, heartburn, reflux, Dr. Bosie Clos 2011: Moderate chronic active H. pylori gastritis, patient was advised to take Prevpac for 14 days ?  Colonoscopy ,2000: Acute mucosal colitis?  Acute phase of inflammatory bowel disease versus infection, NSAIDs are self-limited  Past Medical History:  Diagnosis Date   Anxiety    Depression    Hypothyroidism    Iron deficiency anemia    Thyroid disease     Past Surgical History:  Procedure Laterality Date   BROW LIFT Bilateral 02/03/2022   Procedure: Blepharoplasty Upper Eyelids;  Surgeon: Santiago Glad,  MD;  Location: [facility];  Service: Plastics;  Laterality: Bilateral;   ESOPHAGOGASTRODUODENOSCOPY N/A 11/13/2015   Procedure: ESOPHAGOGASTRODUODENOSCOPY (EGD);  Surgeon: Dorena Cookey, MD;  Location: [facility];  Service: Endoscopy;  Laterality: N/A;   ESOPHAGOGASTRODUODENOSCOPY (EGD) WITH PROPOFOL N/A 12/26/2015   Procedure: ESOPHAGOGASTRODUODENOSCOPY (EGD) WITH PROPOFOL;  Surgeon: Bernette Redbird, MD;  Location: [facility];  Service: Endoscopy;  Laterality: N/A;   PLACEMENT OF BREAST IMPLANTS     20 years ago   thyroid goiter      Prior to Admission medications   Medication Sig Start Date End Date Taking? Authorizing Provider  Carboxymethylcellul-Glycerin (CLEAR EYES FOR DRY EYES OP) Place 1 drop into both eyes daily as needed (dry eyes).    [provider]  Cholecalciferol (VITAMIN D PO) Take 2,000 Units by mouth daily.    [provider]  FIBER ADULT GUMMIES PO Take 1 tablet by mouth daily.    [provider]  iron polysaccharides (NIFEREX) 150 MG capsule Take 1 capsule (150 mg total) by mouth 2 (two) times daily. 02/06/16   Adonis Brook, NP  levothyroxine (SYNTHROID) 100 MCG tablet Take 1 tablet (100 mcg total) by mouth daily before breakfast. Patient taking differently: Take 50-100 mcg by mouth See admin instructions. Take 100 mcg by mouth on Monday, Tuesday, Wednesday, and Thursday. Take 50 mcg on Friday. Skip Saturday and Sunday. 02/07/16   Adonis Brook, NP  LORazepam (ATIVAN) 0.5 MG tablet Take 0.5 tablets (0.25 mg total) by mouth at bedtime as needed for anxiety. Patient taking differently: Take 0.5 mg by mouth in the morning and at bedtime. 11/20/21   Oletta Darter, MD  Omega-3 Fatty Acids (FISH OIL) 1000 MG CAPS Take 1 capsule by mouth.    [provider]  ondansetron (ZOFRAN-ODT) 4 MG disintegrating tablet Take 1 tablet (4 mg total) by mouth every 8 (eight) hours as needed for nausea or vomiting. 01/22/23   Calvert Cantor, MD   rosuvastatin (CRESTOR) 5 MG tablet Take 5 mg by mouth 2 (two) times a week. 11/11/22   [provider]    Current Facility-Administered Medications  Medication Dose Route Frequency Provider Last Rate Last Admin   acetaminophen (TYLENOL) tablet 650 mg  650 mg Oral Q6H PRN Dow Adolph N, DO       cholecalciferol (VITAMIN D3) 25 MCG (1000 UNIT) tablet 2,000 Units  2,000 Units Oral Daily Dow Adolph N, DO       [START ON 02/27/2023] iron polysaccharides (NIFEREX) capsule 150 mg  150 mg Oral BID Hall, Carole N, DO       lactated ringers 1,000 mL with potassium chloride 40 mEq infusion   Intravenous Continuous Hall, Carole N, DO       levothyroxine (SYNTHROID) tablet 100 mcg  100 mcg Oral Q0600 Darlin Drop, DO   100 mcg at 02/26/23 0740   LORazepam (ATIVAN) tablet 0.25 mg  0.25 mg Oral QHS PRN Dow Adolph N, DO   0.25 mg at 02/26/23 0019   melatonin tablet 5 mg  5 mg Oral QHS PRN Dow Adolph N, DO       pantoprazole (PROTONIX) injection 40 mg  40 mg Intravenous BID Dow Adolph N, DO   40 mg at 02/26/23 0741   polyethylene glycol (MIRALAX / GLYCOLAX) packet 17 g  17 g Oral Daily PRN Dow Adolph N, DO       potassium chloride 10 mEq in 100 mL IVPB  10 mEq Intravenous Q1 Hr x 2 Darlin Drop, DO 100 mL/hr at 02/26/23 0804 10 mEq at 02/26/23 6045   prochlorperazine (COMPAZINE) injection 5 mg  5 mg Intravenous Q6H PRN Dow Adolph N, DO        Allergies as of 02/25/2023 - Review Complete 02/25/2023  Allergen Reaction Noted   Benadryl [diphenhydramine hcl] Diarrhea and Other (See Comments) 11/13/2015   Alprazolam Diarrhea 08/27/2015   Lactose intolerance (gi)  02/06/2016    Family History  Problem Relation Age of Onset   Hypertension Mother    Thyroid disease Neg Hx     Social History   Socioeconomic History   Marital status: Married    Spouse name: Not on file   Number of children: Not on file   Years of education: Not on file   Highest education level: Not on file   Occupational History   Not on file  Tobacco Use   Smoking status: Never   Smokeless tobacco: Never  Vaping Use   Vaping status: Never Used  Substance and Sexual Activity   Alcohol use: No   Drug use: No   Sexual activity: Not Currently    Birth control/protection: Post-menopausal  Other Topics Concern   Not on file  Social History Narrative   Not on file   Social Drivers of Health   Financial Resource Strain: Not on file  Food Insecurity: No Food Insecurity (02/26/2023)   Hunger Vital Sign    Worried About Running Out of Food in the Last Year: Never true    Ran Out of Food in the Last Year: Never true  Transportation Needs: No Transportation Needs (02/26/2023)   PRAPARE - Transportation  Lack of Transportation (Medical): No    Lack of Transportation (Non-Medical): No  Physical Activity: Not on file  Stress: Not on file  Social Connections: Not on file  Intimate Partner Violence: Not At Risk (02/26/2023)   Humiliation, Afraid, Rape, and Kick questionnaire    Fear of Current or Ex-Partner: No    Emotionally Abused: No    Physically Abused: No    Sexually Abused: No    Review of Systems:  She has history of anxiety disorder, hypothyroidism and goiter removal in the past. Otherwise review of systems as per HPI  Physical Exam: Vital signs in last 24 hours: Temp:  [97.6 F (36.4 C)-99.3 F (37.4 C)] 98.5 F (36.9 C) (02/21 0608) Pulse Rate:  [64-88] 73 (02/21 0608) Resp:  [13-27] 18 (02/21 0608) BP: (133-164)/(69-98) 133/69 (02/21 0608) SpO2:  [99 %-100 %] 99 % (02/21 0608) Last BM Date : 02/26/23  General: Anxious, petite, thin Head:  Normocephalic and atraumatic. Eyes:  Sclera clear, no icterus.  Mild pallor Ears:  Normal auditory acuity. Nose:  No deformity, discharge,  or lesions. Mouth:  No deformity or lesions.  Oropharynx pink & moist. Neck:  Supple; no masses or thyromegaly. Lungs:  Clear throughout to auscultation.   No wheezes, crackles, or rhonchi.  No acute distress. Heart:  Regular rate and rhythm; no murmurs, clicks, rubs,  or gallops. Extremities:  Without clubbing or edema. Neurologic:  Alert and  oriented x4;  grossly normal neurologically. Skin:  Intact without significant lesions or rashes. Psych:  Alert and cooperative. Normal mood and affect. Abdomen:  Soft, nontender and nondistended. No masses, hepatosplenomegaly or hernias noted. Normal bowel sounds, without guarding, and without rebound.         Lab Results: Recent Labs    02/25/23 1732  WBC 2.8*  HGB 5.8*  HCT 18.3*  PLT 108*   BMET Recent Labs    02/25/23 1732  NA 138  K 3.1*  CL 104  CO2 22  GLUCOSE 116*  BUN 7*  CREATININE 0.83  CALCIUM 9.1   LFT Recent Labs    02/25/23 1732  PROT 7.6  ALBUMIN 4.0  AST 35  ALT 41  ALKPHOS 77  BILITOT 0.6   PT/INR Recent Labs    02/25/23 1840  LABPROT 13.1  INR 1.0    Studies/Results: CT Angio Abd/Pel W and/or Wo Contrast Result Date: 02/25/2023 CLINICAL DATA:  Lower GI bleed EXAM: CTA ABDOMEN AND PELVIS WITHOUT AND WITH CONTRAST TECHNIQUE: Multidetector CT imaging of the abdomen and pelvis was performed using the standard protocol during bolus administration of intravenous contrast. Multiplanar reconstructed images and MIPs were obtained and reviewed to evaluate the vascular anatomy. RADIATION DOSE REDUCTION: This exam was performed according to the departmental dose-optimization program which includes automated exposure control, adjustment of the mA and/or kV according to patient size and/or use of iterative reconstruction technique. CONTRAST:  OMNIPAQUE IOHEXOL 350 MG/ML SOLN COMPARISON:  08/24/2022 FINDINGS: VASCULAR Aorta: Thoracic aorta was imaged. No aneurysm or dissection. Scattered atherosclerosis. Heart is normal size. Abdominal aorta demonstrates scattered calcifications. No aneurysm or dissection. Celiac: Patent without evidence of aneurysm, dissection, vasculitis or significant stenosis.  SMA: Patent without evidence of aneurysm, dissection, vasculitis or significant stenosis. Renals: Both renal arteries are patent without evidence of aneurysm, dissection, vasculitis, fibromuscular dysplasia or significant stenosis. IMA: Patent without evidence of aneurysm, dissection, vasculitis or significant stenosis. Inflow: Patent without evidence of aneurysm, dissection, vasculitis or significant stenosis. Proximal Outflow: Bilateral common femoral and  visualized portions of the superficial and profunda femoral arteries are patent without evidence of aneurysm, dissection, vasculitis or significant stenosis. Veins: No obvious venous abnormality within the limitations of this arterial phase study. Review of the MIP images confirms the above findings. NON-VASCULAR Lower chest: Lungs clear.  No effusions. Hepatobiliary: No focal hepatic abnormality. Gallbladder unremarkable. Pancreas: No focal abnormality or ductal dilatation. Spleen: No focal abnormality.  Normal size. Adrenals/Urinary Tract: No adrenal abnormality. No focal renal abnormality. No stones or hydronephrosis. Urinary bladder is unremarkable. Stomach/Bowel: No contrast extravasation to localize GI bleed. Stomach, large and small bowel grossly unremarkable. Normal appendix. Lymphatic: No adenopathy Reproductive: Uterus and adnexa unremarkable.  No mass. Other: No free fluid or free air. Musculoskeletal: No acute bony abnormality. IMPRESSION: VASCULAR No evidence of aortic aneurysm or dissection. Scattered aortic atherosclerosis. No contrast extravasation to localize GI bleed. NON-VASCULAR No acute findings in the chest, abdomen or pelvis. Electronically Signed   By: Charlett Nose M.D.   On: 02/25/2023 19:20    Impression: Hemoglobin 5.8 on presentation, received 1 unit PRBC transfusion, repeat hemoglobin 13.6?  Error Baseline hemoglobin between 9-11(9.9 on 01/22/2023 and 11.1 01/21/2023) Microcytosis, 77.9 Thrombocytopenia, 108 Hypokalemia,  3.1 Ferritin 447?  Appears after transfusion Normal B12 and folate levels  Hypothyroidism, TSH 13.654 CT angio abdomen and pelvis with and without contrast: No acute findings in chest abdomen or pelvis, no active GI bleed  Plan: Unclear cause of anemia and unintentional weight loss, could be nutritional versus related to anxiety versus GI blood loss. Will proceed with a diagnostic EGD and colonoscopy in a.m. tomorrow with Dr. Dulce Sellar. Discussed about the risks and the benefits of the procedure with the patient and husband at bedside. Will start on clear liquid diet and give colonic prep today.   LOS: 1 day   Kerin Salen, MD  02/26/2023, 8:28 AM

## 2023-02-26 NOTE — Progress Notes (Signed)
   02/26/23 0952  TOC Brief Assessment  Insurance and Status Reviewed  Patient has primary care physician No  Home environment has been reviewed Resides in single family home with spouse  Prior level of function: Independent at baseline  Prior/Current Home Services No current home services  Social Drivers of Health Review SDOH reviewed no interventions necessary  Readmission risk has been reviewed No  Transition of care needs no transition of care needs at this time

## 2023-02-26 NOTE — Progress Notes (Addendum)
Initial Nutrition Assessment  DOCUMENTATION CODES:   Severe malnutrition in context of chronic illness, Underweight  INTERVENTION:   Monitor magnesium, potassium, and phosphorus for at least 3 days, MD to replete as needed, as pt is at risk for refeeding syndrome. -Recommend 100 mg Thiamine daily x 5 days  Once diet advanced: -[employer] 1.4 PO BID, each provides 455 kcals and 20g protein  Check vitamin labs in AM given extended poor PO: -Vitamin A, zinc  NUTRITION DIAGNOSIS:   Severe Malnutrition related to chronic illness as evidenced by severe muscle depletion, moderate fat depletion, energy intake < or equal to 75% for > or equal to 1 month.  GOAL:   Patient will meet greater than or equal to 90% of their needs  MONITOR:   PO intake, Supplement acceptance  REASON FOR ASSESSMENT:   Consult Assessment of nutrition requirement/status, Poor PO  ASSESSMENT:   63 y.o. female with medical history significant for chronic anxiety, failure to thrive in an adult, hypothyroidism, iron deficiency anemia, who presents to the ER due to worsening generalized weakness.  Associated with very poor oral intake for the past month and 8 pounds unintentional weight loss  Patient in room, husband at bedside. Both speak some english and did not want interpreter.  Per pt, has been unable to tolerate any PO since January 16th, so over a month. States she takes a few bites and starts vomiting. Pt mainly consuming chicken soup in which she doesn't eat the actual chicken, just the broth.  Currently NPO, awaiting GI. Husband mentioned EGD, not sure of plan. Pt states she has Boost at home to drink but states this upsets her stomach so she is willing to try Molli Posey here once diet is advanced.   Pt reports not taking any vitamins but Vitamin D was listed in her home medications and she is ordered this. MD checking folate and B-12 levels. Will also add Vitamin A and zinc labs in the AM given poor PO.  Pt reports hair loss as well.   Weighed patient on bed scale: 97 lbs (w/ blankets on) Per weight records, pt weighed 100 lbs on 1/15.  Pt reports UBW ~102-103 lbs.  Medications: Vitamin D, Niferex, Lactated ringers, KCl  Labs reviewed: Low K  NUTRITION - FOCUSED PHYSICAL EXAM:  Flowsheet Row Most Recent Value  Orbital Region Moderate depletion  Upper Arm Region Moderate depletion  Thoracic and Lumbar Region Mild depletion  Buccal Region Mild depletion  Temple Region Moderate depletion  Clavicle Bone Region Moderate depletion  Clavicle and Acromion Bone Region Moderate depletion  Scapular Bone Region Moderate depletion  Dorsal Hand Mild depletion  Patellar Region Severe depletion  Anterior Thigh Region Severe depletion  Posterior Calf Region Severe depletion  Edema (RD Assessment) None  Hair Reviewed  Eyes Reviewed  Mouth Reviewed  Skin Reviewed  Nails Reviewed       Diet Order:   Diet Order             Diet clear liquid Fluid consistency: Thin  Diet effective now                   EDUCATION NEEDS:   No education needs have been identified at this time  Skin:  Skin Assessment: Reviewed RN Assessment  Last BM:  2/20  Height:   Ht Readings from Last 1 Encounters:  01/20/23 5' (1.524 m)    Weight:   Wt Readings from Last 1 Encounters:  01/20/23 45.5 kg  02/26/23: 97 lbs  BMI:  There is no height or weight on file to calculate BMI.  Estimated Nutritional Needs:   Kcal:  1400-1600  Protein:  65-75g  Fluid:  1.6L/day  Tilda Franco, MS, RD, LDN Inpatient Clinical Dietitian Contact via Secure chat

## 2023-02-26 NOTE — Care Management CC44 (Signed)
Condition Code 44 Documentation Completed  Patient Details  Name: Lauren Bolton MRN: [id number] Date of Birth: Dec 29, 1960   Condition Code 44 given:  Yes Patient signature on Condition Code 44 notice:  Yes Documentation of 2 MD's agreement:  Yes Code 44 added to claim:  Yes    Ewing Schlein, LCSW 02/26/2023, 3:50 PM

## 2023-02-27 ENCOUNTER — Observation Stay (HOSPITAL_COMMUNITY): Payer: Medicare Other | Admitting: Certified Registered"

## 2023-02-27 ENCOUNTER — Encounter (HOSPITAL_COMMUNITY)
Admission: EM | Disposition: A | Discharge status: Home / Self Care | Payer: Self-pay | Source: Home / Self Care | Attending: Emergency Medicine

## 2023-02-27 ENCOUNTER — Observation Stay (HOSPITAL_COMMUNITY): Payer: Medicare Other

## 2023-02-27 ENCOUNTER — Encounter (HOSPITAL_COMMUNITY): Payer: Self-pay | Admitting: Internal Medicine

## 2023-02-27 DIAGNOSIS — J984 Other disorders of lung: Secondary | ICD-10-CM | POA: Diagnosis not present

## 2023-02-27 DIAGNOSIS — D509 Iron deficiency anemia, unspecified: Secondary | ICD-10-CM | POA: Diagnosis not present

## 2023-02-27 DIAGNOSIS — R918 Other nonspecific abnormal finding of lung field: Secondary | ICD-10-CM | POA: Diagnosis not present

## 2023-02-27 DIAGNOSIS — B9681 Helicobacter pylori [H. pylori] as the cause of diseases classified elsewhere: Secondary | ICD-10-CM | POA: Diagnosis not present

## 2023-02-27 DIAGNOSIS — E039 Hypothyroidism, unspecified: Secondary | ICD-10-CM | POA: Diagnosis not present

## 2023-02-27 DIAGNOSIS — K2970 Gastritis, unspecified, without bleeding: Secondary | ICD-10-CM | POA: Diagnosis not present

## 2023-02-27 DIAGNOSIS — R634 Abnormal weight loss: Secondary | ICD-10-CM | POA: Diagnosis not present

## 2023-02-27 DIAGNOSIS — F331 Major depressive disorder, recurrent, moderate: Secondary | ICD-10-CM | POA: Diagnosis not present

## 2023-02-27 DIAGNOSIS — D649 Anemia, unspecified: Secondary | ICD-10-CM | POA: Diagnosis not present

## 2023-02-27 DIAGNOSIS — R1013 Epigastric pain: Secondary | ICD-10-CM | POA: Diagnosis not present

## 2023-02-27 DIAGNOSIS — K2950 Unspecified chronic gastritis without bleeding: Secondary | ICD-10-CM | POA: Diagnosis not present

## 2023-02-27 HISTORY — PX: COLONOSCOPY WITH PROPOFOL: SHX5780

## 2023-02-27 HISTORY — PX: BIOPSY: SHX5522

## 2023-02-27 HISTORY — PX: ESOPHAGOGASTRODUODENOSCOPY (EGD) WITH PROPOFOL: SHX5813

## 2023-02-27 LAB — CBC
HCT: 37.2 % (ref 36.0–46.0)
Hemoglobin: 12.4 g/dL (ref 12.0–15.0)
MCH: 25.6 pg — ABNORMAL LOW (ref 26.0–34.0)
MCHC: 33.3 g/dL (ref 30.0–36.0)
MCV: 76.9 fL — ABNORMAL LOW (ref 80.0–100.0)
Platelets: 210 10*3/uL (ref 150–400)
RBC: 4.84 MIL/uL (ref 3.87–5.11)
RDW: 14.7 % (ref 11.5–15.5)
WBC: 10.5 10*3/uL (ref 4.0–10.5)
nRBC: 0 % (ref 0.0–0.2)

## 2023-02-27 LAB — BASIC METABOLIC PANEL
Anion gap: 11 (ref 5–15)
BUN: 11 mg/dL (ref 8–23)
CO2: 22 mmol/L (ref 22–32)
Calcium: 9.3 mg/dL (ref 8.9–10.3)
Chloride: 99 mmol/L (ref 98–111)
Creatinine, Ser: 0.91 mg/dL (ref 0.44–1.00)
GFR, Estimated: >60 mL/min (ref >60)
Glucose, Bld: 84 mg/dL (ref 70–99)
Potassium: 4.2 mmol/L (ref 3.5–5.1)
Sodium: 132 mmol/L — ABNORMAL LOW (ref 135–145)

## 2023-02-27 LAB — PHOSPHORUS: Phosphorus: 4.7 mg/dL — ABNORMAL HIGH (ref 2.5–4.6)

## 2023-02-27 LAB — SEDIMENTATION RATE: Sed Rate: 18 mm/h (ref 0–22)

## 2023-02-27 LAB — CORTISOL: Cortisol, Plasma: 9 ug/dL

## 2023-02-27 LAB — C-REACTIVE PROTEIN: CRP: 0.8 mg/dL (ref <1.0)

## 2023-02-27 LAB — MAGNESIUM: Magnesium: 2.1 mg/dL (ref 1.7–2.4)

## 2023-02-27 SURGERY — ESOPHAGOGASTRODUODENOSCOPY (EGD) WITH PROPOFOL
Anesthesia: Monitor Anesthesia Care

## 2023-02-27 MED ORDER — LIDOCAINE HCL (CARDIAC) PF 100 MG/5ML IV SOSY
PREFILLED_SYRINGE | INTRAVENOUS | Status: DC | PRN
  Administered 2023-02-27: 30 mg via INTRATRACHEAL
  Administered 2023-02-27: 20 mg via INTRATRACHEAL

## 2023-02-27 MED ORDER — LEVOTHYROXINE SODIUM 100 MCG PO TABS
100.0000 ug | ORAL_TABLET | ORAL | Status: DC
Start: 2023-03-01 — End: 2023-02-28

## 2023-02-27 MED ORDER — PHENYLEPHRINE HCL (PRESSORS) 10 MG/ML IV SOLN
INTRAVENOUS | Status: DC | PRN
  Administered 2023-02-27 (×2): 80 ug via INTRAVENOUS

## 2023-02-27 MED ORDER — LEVOTHYROXINE SODIUM 50 MCG PO TABS
50.0000 ug | ORAL_TABLET | ORAL | Status: DC
  Filled 2023-02-27: qty 1

## 2023-02-27 MED ORDER — LEVOTHYROXINE SODIUM 50 MCG PO TABS: 50.0000 ug | ORAL_TABLET | ORAL | Status: DC

## 2023-02-27 MED ORDER — SODIUM CHLORIDE 0.9 % IV SOLN: INTRAVENOUS | Status: DC | PRN

## 2023-02-27 MED ORDER — PROPOFOL 500 MG/50ML IV EMUL
INTRAVENOUS | Status: DC | PRN
  Administered 2023-02-27: 75 ug/kg/min via INTRAVENOUS

## 2023-02-27 MED ORDER — PROPOFOL 10 MG/ML IV BOLUS
INTRAVENOUS | Status: DC | PRN
  Administered 2023-02-27: 20 mg via INTRAVENOUS
  Administered 2023-02-27: 30 mg via INTRAVENOUS
  Administered 2023-02-27: 20 mg via INTRAVENOUS

## 2023-02-27 MED ORDER — IOHEXOL 300 MG/ML  SOLN
100.0000 mL | Freq: Once | INTRAMUSCULAR | Status: AC | PRN
  Administered 2023-02-27: 100 mL via INTRAVENOUS

## 2023-02-27 MED ORDER — ONDANSETRON 4 MG PO TBDP
4.0000 mg | ORAL_TABLET | Freq: Three times a day (TID) | ORAL | Status: DC
  Administered 2023-02-27 – 2023-02-28 (×2): 4 mg via ORAL
  Filled 2023-02-27 (×2): qty 1

## 2023-02-27 MED ORDER — COSYNTROPIN 0.25 MG IJ SOLR
0.2500 mg | Freq: Once | INTRAMUSCULAR | Status: AC
  Administered 2023-02-28: 0.25 mg via INTRAVENOUS
  Filled 2023-02-27: qty 0.25

## 2023-02-27 SURGICAL SUPPLY — 23 items
BLOCK BITE 60FR ADLT L/F BLUE (MISCELLANEOUS) ×2 IMPLANT
ELECT REM PT RETURN 9FT ADLT (ELECTROSURGICAL) IMPLANT
ELECTRODE REM PT RTRN 9FT ADLT (ELECTROSURGICAL) IMPLANT
FLOOR PAD 36X40 (MISCELLANEOUS) ×2 IMPLANT
FORCEP RJ3 GP 1.8X160 W-NEEDLE (CUTTING FORCEPS) IMPLANT
FORCEPS BIOP RAD 4 LRG CAP 4 (CUTTING FORCEPS) IMPLANT
FORCEPS BIOP RJ4 240 W/NDL (CUTTING FORCEPS) IMPLANT
FORCEPS BXJMBJMB 240X2.8X (CUTTING FORCEPS) IMPLANT
INJECTOR/SNARE I SNARE (MISCELLANEOUS) IMPLANT
LUBRICANT JELLY 4.5OZ STERILE (MISCELLANEOUS) IMPLANT
MANIFOLD NEPTUNE II (INSTRUMENTS) IMPLANT
NDL SCLEROTHERAPY 25GX240 (NEEDLE) IMPLANT
NEEDLE SCLEROTHERAPY 25GX240 (NEEDLE) IMPLANT
PAD FLOOR 36X40 (MISCELLANEOUS) ×2 IMPLANT
PROBE APC STR FIRE (PROBE) IMPLANT
PROBE INJECTION GOLD 7FR (MISCELLANEOUS) IMPLANT
SNARE ROTATE MED OVAL 20MM (MISCELLANEOUS) IMPLANT
SNARE SHORT THROW 13M SML OVAL (MISCELLANEOUS) IMPLANT
SYR 50ML LL SCALE MARK (SYRINGE) IMPLANT
TRAP SPECIMEN MUCOUS 40CC (MISCELLANEOUS) IMPLANT
TUBING ENDO SMARTCAP PENTAX (MISCELLANEOUS) ×4 IMPLANT
TUBING IRRIGATION ENDOGATOR (MISCELLANEOUS) ×2 IMPLANT
WATER STERILE IRR 1000ML POUR (IV SOLUTION) IMPLANT

## 2023-02-27 NOTE — Transfer of Care (Signed)
 Immediate Anesthesia Transfer of Care Note  Patient: Tarini Mcdougle  Procedure(s) Performed: ESOPHAGOGASTRODUODENOSCOPY (EGD) WITH PROPOFOL COLONOSCOPY WITH PROPOFOL BIOPSY  Patient Location: PACU  Anesthesia Type:MAC  Level of Consciousness: awake and alert   Airway & Oxygen Therapy: Patient Spontanous Breathing and Patient connected to nasal cannula oxygen  Post-op Assessment: Report given to RN and Post -op Vital signs reviewed and stable  Post vital signs: Reviewed and stable  Last Vitals:  Vitals Value Taken Time  BP 93/60 02/27/23 1011  Temp    Pulse 89 02/27/23 1013  Resp 19 02/27/23 1013  SpO2 100 % 02/27/23 1013  Vitals shown include unfiled device data.  Last Pain:  Vitals:   02/27/23 0903  TempSrc: Temporal  PainSc: 0-No pain      Patients Stated Pain Goal: 2 (02/26/23 1941)  Complications: No notable events documented.

## 2023-02-27 NOTE — Anesthesia Preprocedure Evaluation (Addendum)
 Anesthesia Evaluation  Patient identified by MRN, date of birth, ID band Patient awake    Reviewed: Allergy & Precautions, H&P , NPO status , Patient's Chart, lab work & pertinent test results  Airway Mallampati: III  TM Distance: >3 FB Neck ROM: Full    Dental  (+) Teeth Intact, Dental Advisory Given   Pulmonary neg pulmonary ROS   Pulmonary exam normal breath sounds clear to auscultation       Cardiovascular negative cardio ROS Normal cardiovascular exam Rhythm:Regular Rate:Normal     Neuro/Psych  PSYCHIATRIC DISORDERS Anxiety Depression    negative neurological ROS     GI/Hepatic Neg liver ROS,,,Poor PO x 1 mo, unable to tolerate anything besides broth Severe malnutrition    Endo/Other  Hypothyroidism    Renal/GU negative Renal ROS  negative genitourinary   Musculoskeletal negative musculoskeletal ROS (+)    Abdominal   Peds negative pediatric ROS (+)  Hematology negative hematology ROS (+)   Anesthesia Other Findings   Reproductive/Obstetrics negative OB ROS                             Anesthesia Physical Anesthesia Plan  ASA: 2  Anesthesia Plan: MAC   Post-op Pain Management:    Induction:   PONV Risk Score and Plan: 2 and Propofol infusion and TIVA  Airway Management Planned: Natural Airway and Simple Face Mask  Additional Equipment: None  Intra-op Plan:   Post-operative Plan:   Informed Consent: I have reviewed the patients History and Physical, chart, labs and discussed the procedure including the risks, benefits and alternatives for the proposed anesthesia with the patient or authorized representative who has indicated his/her understanding and acceptance.       Plan Discussed with: CRNA  Anesthesia Plan Comments:        Anesthesia Quick Evaluation

## 2023-02-27 NOTE — Op Note (Signed)
 [facility] Patient Name: Lauren Bolton Procedure Date: 02/27/2023 MRN: [id number] Attending MD: Willis Modena , MD, [id number] Date of Birth: [birth year] CSN: [id number] Age: 63 Admit Type: Inpatient Procedure:                Colonoscopy Indications:              Iron deficiency anemia, Weight loss Providers:                Willis Modena, MD, Rogue Jury, RN, Marja Kays, Technician Referring MD:             [facility] Medicines:                Monitored Anesthesia Care Complications:            No immediate complications. Estimated Blood Loss:     Estimated blood loss: none. Procedure:                Pre-Anesthesia Assessment:                           - Prior to the procedure, a History and Physical                            was performed, and patient medications and                            allergies were reviewed. The patient's tolerance of                            previous anesthesia was also reviewed. The risks                            and benefits of the procedure and the sedation                            options and risks were discussed with the patient.                            All questions were answered, and informed consent                            was obtained. Prior Anticoagulants: The patient has                            taken no anticoagulant or antiplatelet agents. ASA                            Grade Assessment: II - A patient with mild systemic                            disease. After reviewing the risks and benefits,  the patient was deemed in satisfactory condition to                            undergo the procedure.                           After obtaining informed consent, the colonoscope                            was passed under direct vision. Throughout the                            procedure, the patient's blood pressure, pulse, and                             oxygen saturations were monitored continuously. The                            [id number]-H190TL ([id number] Olympus slim colonoscope was                            introduced through the anus and advanced to the the                            terminal ileum, with identification of the                            appendiceal orifice and IC valve. The terminal                            ileum, ileocecal valve, appendiceal orifice, and                            rectum were photographed. The entire colon was                            examined. The colonoscopy was performed without                            difficulty. The patient tolerated the procedure                            well. The quality of the bowel preparation was                            adequate. Scope In: 9:48:35 AM Scope Out: 10:02:00 AM Scope Withdrawal Time: 0 hours 8 minutes 54 seconds  Total Procedure Duration: 0 hours 13 minutes 25 seconds  Findings:      The perianal and digital rectal examinations were normal.      The colon (entire examined portion) appeared normal.      The terminal ileum appeared normal.      The retroflexed view of the distal rectum and anal verge was normal and       showed no anal or rectal abnormalities. Impression:               -  The entire examined colon is normal.                           - The examined portion of the ileum was normal.                           - The distal rectum and anal verge are normal on                            retroflexion view.                           - No specimens collected. Moderate Sedation:      Not Applicable - Patient had care per Anesthesia. Recommendation:           - Return patient to hospital ward for ongoing care.                           - Soft diet today.                           - Continue present medications.                           - Repeat colonoscopy in 10 years for screening                            purposes.                            [facility] will follow. No further inpatient GI                            work-up anticipated. Procedure Code(s):        --- Professional ---                           [phone], Colonoscopy, flexible; diagnostic, including                            collection of specimen(s) by brushing or washing,                            when performed (separate procedure) Diagnosis Code(s):        --- Professional ---                           D50.9, Iron deficiency anemia, unspecified                           R63.4, Abnormal weight loss CPT copyright 2022 [employer]. All rights reserved. The codes documented in this report are preliminary and upon coder review may  be revised to meet current compliance requirements. Willis Modena, MD 02/27/2023 10:28:06 AM This report has been signed electronically. Number of Addenda: 0

## 2023-02-27 NOTE — Op Note (Signed)
 [facility] Patient Name: Lauren Bolton Procedure Date: 02/27/2023 MRN: [id number] Attending MD: Willis Modena , MD, [id number] Date of Birth: 12/04/1960 CSN: [id number] Age: 63 Admit Type: Inpatient Procedure:                Upper GI endoscopy Indications:              Epigastric abdominal pain, Iron deficiency anemia,                            Weight loss Providers:                Willis Modena, MD, Rogue Jury, RN, Marja Kays, Technician Referring MD:             [facility] Medicines:                Monitored Anesthesia Care Complications:            No immediate complications. Estimated Blood Loss:     Estimated blood loss: none. Estimated blood loss:                            none. Procedure:                Pre-Anesthesia Assessment:                           - Prior to the procedure, a History and Physical                            was performed, and patient medications and                            allergies were reviewed. The patient's tolerance of                            previous anesthesia was also reviewed. The risks                            and benefits of the procedure and the sedation                            options and risks were discussed with the patient.                            All questions were answered, and informed consent                            was obtained. Prior Anticoagulants: The patient has                            taken no anticoagulant or antiplatelet agents. ASA                            Grade Assessment: II -  A patient with mild systemic                            disease. After reviewing the risks and benefits,                            the patient was deemed in satisfactory condition to                            undergo the procedure.                           After obtaining informed consent, the endoscope was                            passed under direct vision.  Throughout the                            procedure, the patient's blood pressure, pulse, and                            oxygen saturations were monitored continuously. The                            [id number]-[id number] ([id number]) Olympus endoscope was introduced                            through the mouth, and advanced to the second part                            of duodenum. The upper GI endoscopy was                            accomplished without difficulty. The patient                            tolerated the procedure well. Scope In: Scope Out: Findings:      The examined esophagus was normal.      Patchy mild inflammation was found in the entire examined stomach.       Biopsies were taken with a cold forceps for histology.      The exam of the stomach was otherwise normal.      The duodenal bulb, first portion of the duodenum and second portion of       the duodenum were normal.      No old or fresh blood was seen to the extent of our examination. Impression:               - Normal esophagus.                           - Gastritis. Biopsied.                           - Normal duodenal bulb, first portion of the  duodenum and second portion of the duodenum. Moderate Sedation:      Not Applicable - Patient had care per Anesthesia. Recommendation:           - Await pathology results.                           - Perform a colonoscopy today. Procedure Code(s):        --- Professional ---                           [phone], Esophagogastroduodenoscopy, flexible,                            transoral; with biopsy, single or multiple Diagnosis Code(s):        --- Professional ---                           K29.70, Gastritis, unspecified, without bleeding                           R10.13, Epigastric pain                           D50.9, Iron deficiency anemia, unspecified                           R63.4, Abnormal weight loss CPT copyright 2022 [employer].  All rights reserved. The codes documented in this report are preliminary and upon coder review may  be revised to meet current compliance requirements. Willis Modena, MD 02/27/2023 10:23:36 AM This report has been signed electronically. Number of Addenda: 0

## 2023-02-27 NOTE — Interval H&P Note (Signed)
 History and Physical Interval Note:  02/27/2023 9:20 AM  Lauren Bolton  has presented today for surgery, with the diagnosis of Anemia,weight loss.  The various methods of treatment have been discussed with the patient and family. After consideration of risks, benefits and other options for treatment, the patient has consented to  Procedure(s): ESOPHAGOGASTRODUODENOSCOPY (EGD) WITH PROPOFOL (N/A) COLONOSCOPY WITH PROPOFOL (N/A) as a surgical intervention.  The patient's history has been reviewed, patient examined, no change in status, stable for surgery.  I have reviewed the patient's chart and labs.  Questions were answered to the patient's satisfaction.     Freddy Jaksch

## 2023-02-27 NOTE — Progress Notes (Signed)
 PROGRESS NOTE    Lauren Bolton  ZOX:[id number] DOB: 04/04/1960 DOA: 02/25/2023 PCP: Adrian Prince, MD  Chief Complaint  Patient presents with   Emesis   Diarrhea    Brief Narrative:   Lauren Bolton is Lauren Bolton 63 y.o. female with medical history significant for chronic anxiety, failure to thrive in an adult, hypothyroidism, iron deficiency anemia, who presents to the ER due to worsening generalized weakness.   Assessment & Plan:   Principal Problem:   Symptomatic anemia  Symptomatic Anemia Hb at presentation 5.8, repeat 13 -> suspect Lauren Bolton spurious value -> repeat improved suggests the initial hb 5.8 was spurious  Labs c/w AOCD TSH shows hypothyroidism, needs uptitration of synthroid dose (see below) Appreciate GI assistance, plan for EGD/colonoscopy -> notable for gastritis (follow biopsy)  Gastritis  PPI, follow biopsy  Poor Appetite  Nausea  Weight Loss Appreciate GI assistance CT abd/pelvis without acute findings  Will get CT chest  Will schedule zofran before meals to see if this helps Follow cortisol  She notes nausea when she sees meat, but doesn't describe allergic reaction -> consider alpha gal testing, will hold off for now CRP, sed rate both wnl  Hypothyroidism [name] 13.6, suggests synthroid underdosed.  Sounds like she's been following this closely with [name] for some time.  ( previously only taking 100 mg Mon-Th, then 50 mcg Friday -> 450 mcg per week.  They're anxious about change, [name] has been adjusting outpatient.  Some resistance to changing to 75 mcg daily - 525 mcg per week.  Will have them continue 100 mcg Mon - Thursday, and 50 mcg Friday, Saturday, and Sunday -> 550 mcg per week).  Anxiety Continue ativan      DVT prophylaxis: SCD Code Status: full Family Communication: husband at bedside Disposition:   Status is: Observation The patient remains OBS appropriate and will d/c before 2 midnights.   Consultants:  GI  Procedures:   none  Antimicrobials:  Anti-infectives (From admission, onward)    None       Subjective: Anxiety Fixated on synthroid dosing  Objective: Vitals:   02/27/23 1011 02/27/23 1015 02/27/23 1030 02/27/23 1038  BP: 93/60 108/74 129/72 128/76  Pulse: 88 89 83 84  Resp: 20 (!) 21 20 20   Temp: 98.1 F (36.7 C)  98.1 F (36.7 C)   TempSrc:      SpO2: 100% 100% 99% 100%  Weight:      Height:        Intake/Output Summary (Last 24 hours) at 02/27/2023 1408 Last data filed at 02/27/2023 1035 Gross per 24 hour  Intake 1351.9 ml  Output --  Net 1351.9 ml   Filed Weights   02/27/23 0903  Weight: 41.7 kg    Examination:  General: No acute distress. Cardiovascular: RRR Lungs: unlabored Abdomen: Soft, nontender, nondistended  Neurological: Alert and oriented 3. Moves all extremities 4 with equal strength. Cranial nerves II through XII grossly intact. Extremities: No clubbing or cyanosis. No edema.   Data Reviewed: I have personally reviewed following labs and imaging studies  CBC: Recent Labs  Lab 02/25/23 1732 02/26/23 1156 02/26/23 2010 02/27/23 0711  WBC 2.8*  --   --  10.5  HGB 5.8* 13.6 12.9 12.4  HCT 18.3* 40.2 39.1 37.2  MCV 77.9*  --   --  76.9*  PLT 108*  --   --  210    Basic Metabolic Panel: Recent Labs  Lab 02/25/23 1732 02/26/23 2316 02/27/23 0711  NA  138 132*  --   K 3.1* 4.2  --   CL 104 99  --   CO2 22 22  --   GLUCOSE 116* 84  --   BUN 7* 11  --   CREATININE 0.83 0.91  --   CALCIUM 9.1 9.3  --   MG  --   --  2.1  PHOS  --   --  4.7*    GFR: Estimated Creatinine Clearance: 42.2 mL/min (by C-G formula based on SCr of 0.91 mg/dL).  Liver Function Tests: Recent Labs  Lab 02/25/23 1732  AST 35  ALT 41  ALKPHOS 77  BILITOT 0.6  PROT 7.6  ALBUMIN 4.0    CBG: No results for input(s): "GLUCAP" in the last 168 hours.   No results found for this or any previous visit (from the past 240 hours).       Radiology  Studies: CT Angio Abd/Pel W and/or Wo Contrast Result Date: 02/25/2023 CLINICAL DATA:  Lower GI bleed EXAM: CTA ABDOMEN AND PELVIS WITHOUT AND WITH CONTRAST TECHNIQUE: Multidetector CT imaging of the abdomen and pelvis was performed using the standard protocol during bolus administration of intravenous contrast. Multiplanar reconstructed images and MIPs were obtained and reviewed to evaluate the vascular anatomy. RADIATION DOSE REDUCTION: This exam was performed according to the departmental dose-optimization program which includes automated exposure control, adjustment of the mA and/or kV according to patient size and/or use of iterative reconstruction technique. CONTRAST:  OMNIPAQUE IOHEXOL 350 MG/ML SOLN COMPARISON:  08/24/2022 FINDINGS: VASCULAR Aorta: Thoracic aorta was imaged. No aneurysm or dissection. Scattered atherosclerosis. Heart is normal size. Abdominal aorta demonstrates scattered calcifications. No aneurysm or dissection. Celiac: Patent without evidence of aneurysm, dissection, vasculitis or significant stenosis. SMA: Patent without evidence of aneurysm, dissection, vasculitis or significant stenosis. Renals: Both renal arteries are patent without evidence of aneurysm, dissection, vasculitis, fibromuscular dysplasia or significant stenosis. IMA: Patent without evidence of aneurysm, dissection, vasculitis or significant stenosis. Inflow: Patent without evidence of aneurysm, dissection, vasculitis or significant stenosis. Proximal Outflow: Bilateral common femoral and visualized portions of the superficial and profunda femoral arteries are patent without evidence of aneurysm, dissection, vasculitis or significant stenosis. Veins: No obvious venous abnormality within the limitations of this arterial phase study. Review of the MIP images confirms the above findings. NON-VASCULAR Lower chest: Lungs clear.  No effusions. Hepatobiliary: No focal hepatic abnormality. Gallbladder unremarkable.  Pancreas: No focal abnormality or ductal dilatation. Spleen: No focal abnormality.  Normal size. Adrenals/Urinary Tract: No adrenal abnormality. No focal renal abnormality. No stones or hydronephrosis. Urinary bladder is unremarkable. Stomach/Bowel: No contrast extravasation to localize GI bleed. Stomach, large and small bowel grossly unremarkable. Normal appendix. Lymphatic: No adenopathy Reproductive: Uterus and adnexa unremarkable.  No mass. Other: No free fluid or free air. Musculoskeletal: No acute bony abnormality. IMPRESSION: VASCULAR No evidence of aortic aneurysm or dissection. Scattered aortic atherosclerosis. No contrast extravasation to localize GI bleed. NON-VASCULAR No acute findings in the chest, abdomen or pelvis. Electronically Signed   By: Charlett Nose M.D.   On: 02/25/2023 19:20        Scheduled Meds:  cholecalciferol  2,000 Units Oral Daily   iron polysaccharides  150 mg Oral BID   levothyroxine  75 mcg Oral Q0600   ondansetron  4 mg Oral TID AC   pantoprazole (PROTONIX) IV  40 mg Intravenous BID   Continuous Infusions:  sodium chloride       LOS: 1 day    Time spent:  over 30 min    Lacretia Nicks, MD [facility]   To contact the attending provider between 7A-7P or the covering provider during after hours 7P-7A, please log into the web site [email or web address] and access using universal [place] password for that web site. If you do not have the password, please call the hospital operator.  02/27/2023, 2:08 PM

## 2023-02-27 NOTE — Plan of Care (Signed)
   Problem: Clinical Measurements: Goal: Will remain free from infection Outcome: Progressing   Problem: Pain Managment: Goal: General experience of comfort will improve and/or be controlled Outcome: Progressing   Problem: Safety: Goal: Ability to remain free from injury will improve Outcome: Progressing   Problem: Skin Integrity: Goal: Risk for impaired skin integrity will decrease Outcome: Progressing

## 2023-02-27 NOTE — Anesthesia Postprocedure Evaluation (Signed)
 Anesthesia Post Note  Patient: Lauren Bolton  Procedure(s) Performed: ESOPHAGOGASTRODUODENOSCOPY (EGD) WITH PROPOFOL COLONOSCOPY WITH PROPOFOL BIOPSY     Patient location during evaluation: PACU Anesthesia Type: MAC Level of consciousness: awake and alert Pain management: pain level controlled Vital Signs Assessment: post-procedure vital signs reviewed and stable Respiratory status: spontaneous breathing, nonlabored ventilation and respiratory function stable Cardiovascular status: blood pressure returned to baseline and stable Postop Assessment: no apparent nausea or vomiting Anesthetic complications: no   No notable events documented.  Last Vitals:  Vitals:   02/27/23 1011 02/27/23 1015  BP: 93/60 108/74  Pulse: 88 89  Resp: 20 (!) 21  Temp: 36.7 C   SpO2: 100% 100%    Last Pain:  Vitals:   02/27/23 1015  TempSrc:   PainSc: Asleep                 Lannie Fields

## 2023-02-28 DIAGNOSIS — F411 Generalized anxiety disorder: Secondary | ICD-10-CM | POA: Diagnosis not present

## 2023-02-28 DIAGNOSIS — F329 Major depressive disorder, single episode, unspecified: Secondary | ICD-10-CM | POA: Diagnosis not present

## 2023-02-28 DIAGNOSIS — F32A Depression, unspecified: Secondary | ICD-10-CM | POA: Insufficient documentation

## 2023-02-28 DIAGNOSIS — F0631 Mood disorder due to known physiological condition with depressive features: Secondary | ICD-10-CM

## 2023-02-28 DIAGNOSIS — D649 Anemia, unspecified: Secondary | ICD-10-CM | POA: Diagnosis not present

## 2023-02-28 DIAGNOSIS — R634 Abnormal weight loss: Secondary | ICD-10-CM | POA: Diagnosis not present

## 2023-02-28 LAB — CBC WITH DIFFERENTIAL/PLATELET
Abs Immature Granulocytes: 0.06 10*3/uL (ref 0.00–0.07)
Basophils Absolute: 0 10*3/uL (ref 0.0–0.1)
Basophils Relative: 0 %
Eosinophils Absolute: 0.1 10*3/uL (ref 0.0–0.5)
Eosinophils Relative: 1 %
HCT: 36.9 % (ref 36.0–46.0)
Hemoglobin: 12.4 g/dL (ref 12.0–15.0)
Immature Granulocytes: 1 %
Lymphocytes Relative: 19 %
Lymphs Abs: 2.4 10*3/uL (ref 0.7–4.0)
MCH: 25.8 pg — ABNORMAL LOW (ref 26.0–34.0)
MCHC: 33.6 g/dL (ref 30.0–36.0)
MCV: 76.7 fL — ABNORMAL LOW (ref 80.0–100.0)
Monocytes Absolute: 0.9 10*3/uL (ref 0.1–1.0)
Monocytes Relative: 7 %
Neutro Abs: 9.6 10*3/uL — ABNORMAL HIGH (ref 1.7–7.7)
Neutrophils Relative %: 72 %
Platelets: 193 10*3/uL (ref 150–400)
RBC: 4.81 MIL/uL (ref 3.87–5.11)
RDW: 15.1 % (ref 11.5–15.5)
WBC: 13.1 10*3/uL — ABNORMAL HIGH (ref 4.0–10.5)
nRBC: 0 % (ref 0.0–0.2)

## 2023-02-28 LAB — ACTH STIMULATION, 3 TIME POINTS
Cortisol, 30 Min: 28.7 ug/dL
Cortisol, 60 Min: 30.3 ug/dL
Cortisol, Base: 15.4 ug/dL

## 2023-02-28 LAB — COMPREHENSIVE METABOLIC PANEL
ALT: 29 U/L (ref 0–44)
AST: 24 U/L (ref 15–41)
Albumin: 3.8 g/dL (ref 3.5–5.0)
Alkaline Phosphatase: 70 U/L (ref 38–126)
Anion gap: 13 (ref 5–15)
BUN: 12 mg/dL (ref 8–23)
CO2: 21 mmol/L — ABNORMAL LOW (ref 22–32)
Calcium: 9.1 mg/dL (ref 8.9–10.3)
Chloride: 94 mmol/L — ABNORMAL LOW (ref 98–111)
Creatinine, Ser: 1.03 mg/dL — ABNORMAL HIGH (ref 0.44–1.00)
GFR, Estimated: >60 mL/min (ref >60)
Glucose, Bld: 96 mg/dL (ref 70–99)
Potassium: 3.7 mmol/L (ref 3.5–5.1)
Sodium: 128 mmol/L — ABNORMAL LOW (ref 135–145)
Total Bilirubin: 1.3 mg/dL — ABNORMAL HIGH (ref 0.0–1.2)
Total Protein: 7.1 g/dL (ref 6.5–8.1)

## 2023-02-28 LAB — MAGNESIUM: Magnesium: 2.1 mg/dL (ref 1.7–2.4)

## 2023-02-28 LAB — PHOSPHORUS: Phosphorus: 3.5 mg/dL (ref 2.5–4.6)

## 2023-02-28 MED ORDER — ONDANSETRON 4 MG PO TBDP
4.0000 mg | ORAL_TABLET | Freq: Three times a day (TID) | ORAL | Status: DC | PRN
  Administered 2023-02-28: 4 mg via ORAL
  Filled 2023-02-28: qty 1

## 2023-02-28 MED ORDER — BUSPIRONE HCL 5 MG PO TABS
5.0000 mg | ORAL_TABLET | Freq: Three times a day (TID) | ORAL | Status: DC
  Administered 2023-02-28 – 2023-03-01 (×3): 5 mg via ORAL
  Filled 2023-02-28 (×3): qty 1

## 2023-02-28 MED ORDER — SUCRALFATE 1 GM/10ML PO SUSP
1.0000 g | Freq: Three times a day (TID) | ORAL | Status: DC
  Administered 2023-02-28 – 2023-03-01 (×3): 1 g via ORAL
  Filled 2023-02-28 (×4): qty 10

## 2023-02-28 MED ORDER — LEVOTHYROXINE SODIUM 75 MCG PO TABS
75.0000 ug | ORAL_TABLET | Freq: Every day | ORAL | Status: DC
  Administered 2023-03-01: 75 ug via ORAL
  Filled 2023-02-28: qty 1

## 2023-02-28 MED ORDER — MIRTAZAPINE 15 MG PO TABS
15.0000 mg | ORAL_TABLET | Freq: Every day | ORAL | Status: DC
  Administered 2023-02-28: 15 mg via ORAL
  Filled 2023-02-28: qty 1

## 2023-02-28 MED ORDER — MIRTAZAPINE 15 MG PO TABS
7.5000 mg | ORAL_TABLET | Freq: Every day | ORAL | Status: DC
Start: 2023-02-28 — End: 2023-02-28

## 2023-02-28 NOTE — Progress Notes (Addendum)
 PROGRESS NOTE    Jamaiyah Pellum  WGN:[id number] DOB: [birth year] DOA: 02/25/2023 PCP: Adrian Prince, MD  Chief Complaint  Patient presents with   Emesis   Diarrhea    Brief Narrative:   Vieno Oleson is Samwise Eckardt 63 y.o. female with medical history significant for chronic anxiety, failure to thrive in an adult, hypothyroidism, iron deficiency anemia, who presents to the ER due to worsening generalized weakness.   Assessment & Plan:   Principal Problem:   Symptomatic anemia  Symptomatic Anemia Hb at presentation 5.8, repeat 13 -> suspect Norvil Martensen spurious value -> repeat improved suggests the initial hb 5.8 was spurious  Labs c/w AOCD TSH shows hypothyroidism, needs uptitration of synthroid dose (see below) Appreciate GI assistance, plan for EGD/colonoscopy -> notable for gastritis (follow biopsy)  Gastritis  PPI, follow biopsy  Poor Appetite  Nausea  Weight Loss Appreciate GI assistance Will ask psych to evaluate her -> she has hx significant mood disorder and in past there has been concern her mood disorder has contributed to her GI symptoms She's eaten some soup this morning, will ask nursing to document PO intake, would preferably like this to increase prior to her discharge. CT abd/pelvis without acute findings  CT chest with minimal patchy ground glass and tree in bud opacities in the inferior left upper lobe and left lower lobe, likely infectious/inflammatory Zofran prn Follow cortisol (wnl), ACTH stim pending Celiac testing pending Remeron  She notes nausea when she sees meat, but doesn't describe allergic reaction -> consider alpha gal testing, will hold off for now CRP, sed rate both wnl  Hypothyroidism [name] 13.6, suggests synthroid underdosed.  Initially reluctant to dose change, she's agreeable to 75 mcg daily now after having this dose yesterday and feeling ok.  Anxiety Continue ativan  She seems to have severe anxiety, suspect depression as well Appreciate psych  assistance     DVT prophylaxis: SCD Code Status: full Family Communication: husband at bedside Disposition:   Status is: Observation The patient remains OBS appropriate and will d/c before 2 midnights.   Consultants:  GI  Procedures:  none  Antimicrobials:  Anti-infectives (From admission, onward)    None       Subjective: Anxious - asking again about synthroid dosing Tearful occasionally when asked about mood No SI   Objective: Vitals:   02/27/23 1355 02/27/23 2112 02/28/23 0500 02/28/23 0533  BP: 122/70 131/82  123/76  Pulse: 80 90  95  Resp:  18  19  Temp: 98 F (36.7 C) 99 F (37.2 C)  98.1 F (36.7 C)  TempSrc: Oral Oral  Oral  SpO2: 100% 100%  97%  Weight:   43.3 kg   Height:        Intake/Output Summary (Last 24 hours) at 02/28/2023 0936 Last data filed at 02/27/2023 2150 Gross per 24 hour  Intake 690 ml  Output --  Net 690 ml   Filed Weights   02/27/23 0903 02/28/23 0500  Weight: 41.7 kg 43.3 kg    Examination:  General: appears chronically anxious Lungs: unlabored Neurological: Alert and oriented 3. Moves all extremities 4 with equal strength. Cranial nerves II through XII grossly intact. Extremities: No clubbing or cyanosis. No edema.   Data Reviewed: I have personally reviewed following labs and imaging studies  CBC: Recent Labs  Lab 02/25/23 1732 02/26/23 1156 02/26/23 2010 02/27/23 0711 02/28/23 0522  WBC 2.8*  --   --  10.5 13.1*  NEUTROABS  --   --   --   --  9.6*  HGB 5.8* 13.6 12.9 12.4 12.4  HCT 18.3* 40.2 39.1 37.2 36.9  MCV 77.9*  --   --  76.9* 76.7*  PLT 108*  --   --  210 193    Basic Metabolic Panel: Recent Labs  Lab 02/25/23 1732 02/26/23 2316 02/27/23 0711 02/28/23 0522  NA 138 132*  --  128*  K 3.1* 4.2  --  3.7  CL 104 99  --  94*  CO2 22 22  --  21*  GLUCOSE 116* 84  --  96  BUN 7* 11  --  12  CREATININE 0.83 0.91  --  1.03*  CALCIUM 9.1 9.3  --  9.1  MG  --   --  2.1 2.1  PHOS  --   --   4.7* 3.5    GFR: Estimated Creatinine Clearance: 38.7 mL/min (Adryen Cookson) (by C-G formula based on SCr of 1.03 mg/dL (H)).  Liver Function Tests: Recent Labs  Lab 02/25/23 1732 02/28/23 0522  AST 35 24  ALT 41 29  ALKPHOS 77 70  BILITOT 0.6 1.3*  PROT 7.6 7.1  ALBUMIN 4.0 3.8    CBG: No results for input(s): "GLUCAP" in the last 168 hours.   No results found for this or any previous visit (from the past 240 hours).       Radiology Studies: CT CHEST W CONTRAST Result Date: 02/27/2023 CLINICAL DATA:  Unintentional weight loss EXAM: CT CHEST WITH CONTRAST TECHNIQUE: Multidetector CT imaging of the chest was performed during intravenous contrast administration. RADIATION DOSE REDUCTION: This exam was performed according to the departmental dose-optimization program which includes automated exposure control, adjustment of the mA and/or kV according to patient size and/or use of iterative reconstruction technique. CONTRAST:  OMNIPAQUE IOHEXOL 300 MG/ML  SOLN COMPARISON:  None Available. FINDINGS: Cardiovascular: No significant vascular findings. Normal heart size. No pericardial effusion. Mediastinum/Nodes: Thyroid gland is surgically absent. No enlarged lymph nodes are seen. Esophagus is within normal limits. Lungs/Pleura: There are minimal patchy ground-glass and tree-in-bud opacities in the inferior left upper lobe and left lower lobe. Lungs are otherwise clear. There is no pleural effusion or pneumothorax. Upper Abdomen: No acute abnormality. Musculoskeletal: Bilateral breast implants are present. No significant osseous abnormality. IMPRESSION: 1. Minimal patchy ground-glass and tree-in-bud opacities in the inferior left upper lobe and left lower lobe, likely infectious/inflammatory. 2. No other acute cardiopulmonary process. Electronically Signed   By: Darliss Cheney M.D.   On: 02/27/2023 19:22        Scheduled Meds:  cholecalciferol  2,000 Units Oral Daily   iron polysaccharides   150 mg Oral BID   [START ON 03/01/2023] levothyroxine  100 mcg Oral Q [id number]   levothyroxine  50 mcg Oral Weekly   And   [START ON 03/06/2023] levothyroxine  50 mcg Oral Weekly   mirtazapine  7.5 mg Oral QHS   pantoprazole (PROTONIX) IV  40 mg Intravenous BID   Continuous Infusions:     LOS: 1 day    Time spent: over 30 min    Lacretia Nicks, MD [facility]   To contact the attending provider between 7A-7P or the covering provider during after hours 7P-7A, please log into the web site [email or web address] and access using universal [place] password for that web site. If you do not have the password, please call the hospital operator.  02/28/2023, 9:36 AM

## 2023-02-28 NOTE — Progress Notes (Signed)
 Subjective: Complains reflux and overall weakness and malaise.  Objective: Vital signs in last 24 hours: Temp:  [98 F (36.7 C)-99 F (37.2 C)] 98.6 F (37 C) (02/23 1242) Pulse Rate:  [76-95] 76 (02/23 1242) Resp:  [16-19] 16 (02/23 1242) BP: (122-137)/(70-82) 137/78 (02/23 1242) SpO2:  [97 %-100 %] 100 % (02/23 1242) Weight:  [43.3 kg] 43.3 kg (02/23 0500) Weight change:  Last BM Date : 02/27/23  PE: GEN:  Thin but NAD NEURO:  Anxious but no encephalopathy ABD:  Mild generalized tenderness without peritonitis  Lab Results: CBC    Component Value Date/Time   WBC 13.1 (H) 02/28/2023 0522   RBC 4.81 02/28/2023 0522   HGB 12.4 02/28/2023 0522   HCT 36.9 02/28/2023 0522   PLT 193 02/28/2023 0522   MCV 76.7 (L) 02/28/2023 0522   MCH 25.8 (L) 02/28/2023 0522   MCHC 33.6 02/28/2023 0522   RDW 15.1 02/28/2023 0522   LYMPHSABS 2.4 02/28/2023 0522   MONOABS 0.9 02/28/2023 0522   EOSABS 0.1 02/28/2023 0522   BASOSABS 0.0 02/28/2023 0522  CMP     Component Value Date/Time   NA 128 (L) 02/28/2023 0522   K 3.7 02/28/2023 0522   CL 94 (L) 02/28/2023 0522   CO2 21 (L) 02/28/2023 0522   GLUCOSE 96 02/28/2023 0522   BUN 12 02/28/2023 0522   CREATININE 1.03 (H) 02/28/2023 0522   CALCIUM 9.1 02/28/2023 0522   PROT 7.1 02/28/2023 0522   ALBUMIN 3.8 02/28/2023 0522   AST 24 02/28/2023 0522   ALT 29 02/28/2023 0522   ALKPHOS 70 02/28/2023 0522   BILITOT 1.3 (H) 02/28/2023 0522   GFRNONAA >60 02/28/2023 0522    Assessment:   Weakness with microcytic anemia.  Had Hgb down to 5.8, but based on review of prior Hgb levels and disproportionate response to blood transfusion, I suspect this is lab error (while patient still was undoubtedly anemic). Weight loss. GERD Abdominal discomfort. Anxiety.  Plan:   Awaiting celiac serologies. Awaiting gastritis biopsies. No fulminant abnormalities seen on CT scan or endoscopy or colonoscopy.  I suspect there is strong anxiety component  to symptoms. Add sucralfate suspension. Agree with psychiatry evaluation. Continue antiemetics and PPI. [facility] will follow.   Lauren Bolton 02/28/2023, 1:41 PM   Cell [phone] If no answer or after 5 PM call [phone]

## 2023-02-28 NOTE — Consult Note (Signed)
 [facility] Psychiatric Consult Initial  Patient Name: .Lauren Bolton  MRN: [id number]  DOB: 05-09-1960  Consult Order details:  Orders (From admission, onward)     Start     Ordered   02/28/23 0934  IP CONSULT TO PSYCHIATRY       Ordering Provider: Zigmund Daniel., MD  Provider:  (Not yet assigned)  Question Answer Comment  Location [facility]   Reason for Consult? poor PO intake, severe anxiety - she has history of anxiety/depression - concern in past GI symptoms maybe related to her mood disorder      02/28/23 0935             Mode of Visit: In person    Psychiatry Consult Evaluation  Service Date: February 28, 2023 LOS:  LOS: 1 day  Chief Complaint "I feel depressed and anxious all the time."  Primary Psychiatric Diagnoses  Major depressive disorder r/o MDD due to another medical condition (hypothyroidism) 2.  Generalized anxiety disorder   Assessment  Lauren Bolton is a 63 y.o. female admitted: Medicallyfor 02/25/2023  5:18 PM for worsening generalized weakness. She carries the psychiatric diagnoses of anxiety/depression and has a past medical history of failure to thrive in an adult, hypothyroidism, and iron deficiency anemia.     Her current presentation of depressed mood, low energy level, lack of motivation, poor appetite, sleep issues, fatigue, and generalized weakness in a patient with ongoing hypothyroidism is most consistent with depression secondary to another medical condition. She does not meet criteria for inpatient admission based on absent suicidal or homicidal ideation, intent or plan with good support system in her husband.  Current outpatient psychotropic medications include Remeron 7.5 mg at bedtime  and historically she has had a moderate response to these medications. She was compliant with medications prior to admission as evidenced by patient's report. On initial examination, patient is awake, alert, and cooperative. Her mood  is "depressed" with anxious affect. Please see plan below for detailed recommendations.   Diagnoses:  Active Hospital problems: Principal Problem:   Symptomatic anemia    Plan   ## Psychiatric Medication Recommendations:  Increase Mirtazapine to 15 mg at bedtime for sleep/depression/appetite stimulation Add Buspirone 5 mg three times daily for anxiety Please optimize synthroid dosage to better control hypothyroidism.  Patient is interested in connecting to an outpatient psychiatric upon hospital discharge.   ## Medical Decision Making Capacity: Not specifically addressed in this encounter  ## Further Work-up:  -- None at this time  -- most recent EKG on 02/27/23 had QtC of 434 -- Pertinent labwork reviewed earlier this admission includes: TSH-13.654, T4-1.17,    ## Disposition:-- There are no psychiatric contraindications to discharge at this time  ## Behavioral / Environmental: - No specific recommendations at this time.     ## Safety and Observation Level:  - Based on my clinical evaluation, I estimate the patient to be at low risk of self harm in the current setting. - At this time, we recommend  routine. This decision is based on my review of the chart including patient's history and current presentation, interview of the patient, mental status examination, and consideration of suicide risk including evaluating suicidal ideation, plan, intent, suicidal or self-harm behaviors, risk factors, and protective factors. This judgment is based on our ability to directly address suicide risk, implement suicide prevention strategies, and develop a safety plan while the patient is in the clinical setting. Please contact our team if there is a  concern that risk level has changed.  CSSR Risk Category:C-SSRS RISK CATEGORY: No Risk  Suicide Risk Assessment: Patient has following modifiable risk factors for suicide: under treated depression , which we are addressing by prescribing and  optimizing patient's medications. Patient has following non-modifiable or demographic risk factors for suicide: Patient has the following protective factors against suicide: Supportive family, Frustration tolerance, and no history of suicide attempts  Thank you for this consult request. Recommendations have been communicated to the primary team.  We will follow up at this time.   Fredonia Highland, MD       History of Present Illness  Relevant Aspects of [facility] Course:  Admitted on 02/25/2023 due to worsening generalized weakness.   Patient Report:  Patient seen face to face in her hospital room, with patient;'s husband at the bedside. She is awake, alert and oriented x 4. Patient reports ongoing depressed mood, low energy level, lack of motivation, poor appetite, fatigue, and generalized weakness. Additionally, she reports frequent anxiety, apprehension, worry, nervousness, and problem concentrating. However, patient denies delusions, hallucinations, suicidal or homicidal ideation, intent or plan. Patient currently takes Remeron 7.5 mg at bedtime for depression and is interested in optimizing the dose. Patient reports chronic history of hypothyroidism for which she takes Synthroid. She reports compliance with the medication but the thyroid level is currently non controlled. Patient is currently on Synthroid 75 mcg daily, and weekly increase in dosage per the treating physician.    Psych ROS:  Depression: yes, as described above Anxiety:  yes, as described above  Mania (lifetime and current): denies  Psychosis: (lifetime and current): denies   Collateral information:  Collateral information obtained from the patient's husband at bedside.   ROS   Psychiatric and Social History  Psychiatric History:  Information collected from patient and husband.   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  Spiritual Hx: unknown  Access to weapons/lethal means: denies    Substance History Alcohol: denies   Type of alcohol N/A Last Drink N/A Number of drinks per day N/A History of alcohol withdrawal seizures N/A History of DT's N/A Tobacco: N/A Illicit drugs: denies  Prescription drug abuse: denies  Rehab hx: denies   Exam Findings  Physical Exam:   General: appears chronically anxious Lungs: unlabored Neurological: Alert and oriented 3. Moves all extremities 4 with equal strength. Cranial nerves II through XII grossly intact. Extremities: No clubbing or cyanosis. No edema.    Vital Signs:  Temp:  [98.1 F (36.7 C)-99 F (37.2 C)] 98.6 F (37 C) (02/23 1242) Pulse Rate:  [76-95] 76 (02/23 1242) Resp:  [16-19] 16 (02/23 1242) BP: (123-137)/(76-82) 137/78 (02/23 1242) SpO2:  [97 %-100 %] 100 % (02/23 1242) Weight:  [43.3 kg] 43.3 kg (02/23 0500) Blood pressure 137/78, pulse 76, temperature 98.6 F (37 C), temperature source Oral, resp. rate 16, height 5' (1.524 m), weight 43.3 kg, SpO2 100%. Body mass index is 18.63 kg/m.  Physical Exam  Mental Status Exam: General Appearance: Casual  Orientation:  Full (Time, Place, and Person)  Memory:  Immediate;   Good  Concentration:  Concentration: Fair  Recall:  Good  Attention  Fair  Eye Contact:  Poor  Speech:  Slow  Language:  Good  Volume:  Decreased  Mood: 'Depressed"  Affect:   anxious  Thought Process:  Linear  Thought Content:  Logical  Suicidal Thoughts:  No  Homicidal Thoughts:  No  Judgement:  Good  Insight:  Good  Psychomotor Activity:  Decreased  Akathisia:  No  Fund of Knowledge:  Fair       Assets:  Communication Skills  Cognition:  WNL  ADL's:  Intact  AIMS (if indicated):        Other History   These have been pulled in through the EMR, reviewed, and updated if appropriate.  Family History:  The patient's family history includes Hypertension in her mother.  Medical History: Past Medical History:  Diagnosis Date   Anxiety    Depression    Hypothyroidism    Iron deficiency anemia    Thyroid disease     Surgical History: Past Surgical History:  Procedure Laterality Date   BROW LIFT Bilateral 02/03/2022   Procedure: Blepharoplasty Upper Eyelids;  Surgeon: Santiago Glad, MD;  Location: [facility];  Service: Plastics;  Laterality: Bilateral;   ESOPHAGOGASTRODUODENOSCOPY N/A 11/13/2015   Procedure: ESOPHAGOGASTRODUODENOSCOPY (EGD);  Surgeon: Dorena Cookey, MD;  Location: [facility];  Service: Endoscopy;  Laterality: N/A;   ESOPHAGOGASTRODUODENOSCOPY (EGD) WITH PROPOFOL N/A 12/26/2015   Procedure: ESOPHAGOGASTRODUODENOSCOPY (EGD) WITH PROPOFOL;  Surgeon: Bernette Redbird, MD;  Location[facility];  Service: Endoscopy;  Laterality: N/A;   PLACEMENT OF BREAST IMPLANTS     20 years ago   thyroid goiter       Medications:   Current Facility-Administered Medications:    acetaminophen (TYLENOL) tablet 650 mg, 650 mg, Oral, Q6H PRN, Willis Modena, MD, 650 mg at 02/27/23 2146   cholecalciferol (VITAMIN D3) 25 MCG (1000 UNIT) tablet 2,000 Units, 2,000 Units, Oral, Daily, Willis Modena, MD, 2,000 Units at 02/28/23 1010   iron polysaccharides (NIFEREX) capsule 150 mg, 150 mg, Oral, BID, Willis Modena, MD, 150 mg at 02/28/23 1011   levothyroxine (SYNTHROID) tablet 75 mcg, 75 mcg, Oral, Q0600, Zigmund Daniel., MD   LORazepam (ATIVAN) tablet 0.5 mg, 0.5 mg, Oral, TID PRN, Willis Modena, MD, 0.5 mg at 02/28/23 0845   melatonin tablet 5 mg, 5 mg, Oral, QHS PRN, Willis Modena, MD, 5 mg at 02/26/23 2330   mirtazapine (REMERON) tablet 7.5 mg,  7.5 mg, Oral, QHS, Powell, Lottie Mussel., MD   ondansetron (ZOFRAN-ODT) disintegrating tablet 4 mg, 4 mg, Oral, Q8H PRN, Zigmund Daniel., MD   pantoprazole (PROTONIX) injection 40 mg, 40 mg, Intravenous, BID, Willis Modena, MD, 40 mg at 02/28/23 1011   prochlorperazine (COMPAZINE) injection 5 mg, 5 mg, Intravenous, Q6H PRN, Willis Modena, MD   sucralfate (CARAFATE) 1 GM/10ML suspension 1 g, 1 g, Oral, TID WC & HS, Willis Modena, MD  Allergies: Allergies  Allergen Reactions   Benadryl [Diphenhydramine Hcl] Diarrhea and Other (See Comments)    Reaction:  Makes her excessively sleepy    Alprazolam Diarrhea   Lactose Intolerance (Gi)     Fredonia Highland, MD

## 2023-03-01 ENCOUNTER — Other Ambulatory Visit (HOSPITAL_COMMUNITY): Payer: Self-pay

## 2023-03-01 ENCOUNTER — Encounter (HOSPITAL_COMMUNITY): Payer: Self-pay | Admitting: Gastroenterology

## 2023-03-01 DIAGNOSIS — D649 Anemia, unspecified: Secondary | ICD-10-CM | POA: Diagnosis not present

## 2023-03-01 DIAGNOSIS — F331 Major depressive disorder, recurrent, moderate: Secondary | ICD-10-CM | POA: Diagnosis not present

## 2023-03-01 LAB — GLIADIN ANTIBODIES, SERUM
Antigliadin Abs, IgA: 2 U (ref 0–19)
Gliadin IgG: 1 U (ref 0–19)

## 2023-03-01 MED ORDER — PANTOPRAZOLE SODIUM 40 MG PO TBEC
40.0000 mg | DELAYED_RELEASE_TABLET | Freq: Two times a day (BID) | ORAL | 1 refills | Status: AC
  Filled 2023-03-01: qty 60, 30d supply, fill #0

## 2023-03-01 MED ORDER — LORAZEPAM 0.5 MG PO TABS: 0.5000 mg | ORAL_TABLET | Freq: Three times a day (TID) | ORAL | Status: AC

## 2023-03-01 MED ORDER — MIRTAZAPINE 15 MG PO TABS
15.0000 mg | ORAL_TABLET | Freq: Every day | ORAL | 1 refills | Status: AC
  Filled 2023-03-01: qty 30, 30d supply, fill #0

## 2023-03-01 MED ORDER — SUCRALFATE 1 G PO TABS
1.0000 g | ORAL_TABLET | Freq: Three times a day (TID) | ORAL | 1 refills | Status: AC
  Filled 2023-03-01: qty 120, 30d supply, fill #0

## 2023-03-01 MED ORDER — BUSPIRONE HCL 5 MG PO TABS
5.0000 mg | ORAL_TABLET | Freq: Three times a day (TID) | ORAL | 1 refills | Status: AC
  Filled 2023-03-01: qty 90, 30d supply, fill #0

## 2023-03-01 MED ORDER — LEVOTHYROXINE SODIUM 75 MCG PO TABS
75.0000 ug | ORAL_TABLET | Freq: Every day | ORAL | 1 refills | Status: AC
  Filled 2023-03-01: qty 30, 30d supply, fill #0

## 2023-03-01 NOTE — Discharge Summary (Signed)
 Physician Discharge Summary  Lauren Bolton ZOX:[id number] DOB: 09-Dec-1960 DOA: 02/25/2023  PCP: Adrian Prince, MD  Admit date: 02/25/2023 Discharge date: 03/01/2023  Time spent: 40 minutes  Recommendations for Outpatient Follow-up:  Follow outpatient CBC/CMP  Follow results of biopsy from endoscopy outpatient (gastritis) Follow celiac labs outpatient Needs repeat TSH in about 6 weeks  Follow anxiety/depression outpatient - needs ongoing psych care Consider gradual taper of ativan as appropriate   Discharge Diagnoses:  Principal Problem:   Symptomatic anemia Active Problems:   Depression   Major depressive disorder, recurrent episode, moderate (HCC)   Discharge Condition: stable  Diet recommendation: heart healthy  Filed Weights   02/27/23 0903 02/28/23 0500 03/01/23 0500  Weight: 41.7 kg 43.3 kg 45 kg    History of present illness:   Lauren Bolton is Lauren Bolton 63 y.o. female with medical history significant for chronic anxiety, failure to thrive in an adult, hypothyroidism, iron deficiency anemia, who presents to the ER due to worsening generalized weakness.   Initial labs concerning for severe anemia, but repeat labs suggested spurious initial findings.  She had GI eval with EGD/colonoscopy which showed gastritis.  Suspect Lauren Bolton component of her poor PO intake/malaise related to her anxiety/depression, she's been started on remeron and buspar.  Also, has underdosed synthroid, this has been adjusted to 75 mcg daily (from 100 mcg M-Th and 50 mcg Fri).  Stable for discharge today.   Hospital Course:  Assessment and Plan:  Symptomatic Anemia Hb at presentation 5.8, repeat 13 -> suspect Lauren Bolton spurious value -> repeat improved suggests the initial hb 5.8 was spurious  Labs c/w AOCD TSH shows hypothyroidism, needs uptitration of synthroid dose (see below) Appreciate GI assistance, plan for EGD/colonoscopy -> notable for gastritis (follow biopsy)   Gastritis  PPI, follow biopsy   Poor  Appetite  Nausea  Weight Loss Appreciate GI assistance Will ask psych to evaluate her -> she has hx significant mood disorder and in past there has been concern her mood disorder has contributed to her GI symptoms CT abd/pelvis without acute findings  CT chest with minimal patchy ground glass and tree in bud opacities in the inferior left upper lobe and left lower lobe, likely infectious/inflammatory Zofran prn Follow cortisol (wnl), ACTH stim wnl Celiac testing pending Remeron  She notes nausea when she sees meat, but doesn't describe allergic reaction -> consider alpha gal testing, will hold off for now CRP, sed rate both wnl   Hypothyroidism [name] 13.6, suggests synthroid underdosed.  Initially reluctant to dose change, she's agreeable to 75 mcg daily now (525 mcg weekly) (was previously on 100 mcg Mon, Tues, Wed, Thurs and 50 mcg on Friday - 450 mcg weekly) after having this dose yesterday and feeling ok.   Anxiety Continue ativan  She seems to have severe anxiety, suspect depression as well Appreciate psych assistance -> remeron + buspar, optimizing synthroid - needs outpatient psych follow up      Procedures: Colonoscopy, endoscopy   Consultations: Psych GI  Discharge Exam: Vitals:   02/28/23 2017 03/01/23 0416  BP: (!) 140/85 (!) 140/91  Pulse: 75 73  Resp: 20 20  Temp: 98.2 F (36.8 C) 98.3 F (36.8 C)  SpO2: 100% 99%   No complaints Eager to discharge Husband at bedside They declined interpreter earlier in hospitalization, I've been discussing plan with them in english daily without issue   General: No acute distress. Cardiovascular: RRR Lungs: unlabored Abdomen: Soft, nontender, nondistended  Neurological: Alert and oriented 3. Moves all extremities  4 with equal strength. Cranial nerves II through XII grossly intact. Extremities: No clubbing or cyanosis. No edema.  Discharge Instructions   Discharge Instructions     Call MD for:  difficulty  breathing, headache or visual disturbances   Complete by: As directed    Call MD for:  extreme fatigue   Complete by: As directed    Call MD for:  hives   Complete by: As directed    Call MD for:  persistant dizziness or light-headedness   Complete by: As directed    Call MD for:  persistant nausea and vomiting   Complete by: As directed    Call MD for:  redness, tenderness, or signs of infection (pain, swelling, redness, odor or green/yellow discharge around incision site)   Complete by: As directed    Call MD for:  severe uncontrolled pain   Complete by: As directed    Call MD for:  temperature >100.4   Complete by: As directed    Diet - low sodium heart healthy   Complete by: As directed    Discharge instructions   Complete by: As directed    You were seen for poor oral intake and concern for anemia (low blood counts).  Your initial blood count was inaccurate.  You have anemia (low red blood cells), but it is mild.  You had an endoscopy and colonoscopy which showed gastritis (stomach inflammation).  There are currently biopsies (samples) pending.  Gastroenterology will call you with the results of those biopsies.  If you don't hear anything you should call them.  You should follow up with gastroenterology outpatient.  We had psychiatry see you.  We'll start you on buspar for your anxiety.  We'll also start you on remeron for you appetite and your mood.  Continue the ativan as prescribed, but at some time it will probably be appropriate to try to gradually titrate that down (this should be done under the guidance of Lauren Bolton physician).  Please establish with Lauren Bolton psychiatrist outpatient.  Your thyroid hormone is underdosed.  We'll increase it to 75 mcg daily (every day).  You should follow up for repeat thyroid testing in Lauren Bolton about 6 weeks with Dr. Evlyn Bolton.  Return for new, recurrent, or worsening symptoms.  Please ask your PCP to request records from this hospitalization so they know what was  done and what the next steps will be.   Increase activity slowly   Complete by: As directed       Allergies as of 03/01/2023       Reactions   Benadryl [diphenhydramine Hcl] Diarrhea, Other (See Comments)   Reaction:  Makes her excessively sleepy    Alprazolam Diarrhea   Lactose Intolerance (gi)         Medication List     STOP taking these medications    rosuvastatin 5 MG tablet Commonly known as: CRESTOR       TAKE these medications    busPIRone 5 MG tablet Commonly known as: BUSPAR Take 1 tablet (5 mg total) by mouth 3 (three) times daily.   iron polysaccharides 150 MG capsule Commonly known as: NIFEREX Take 1 capsule (150 mg total) by mouth 2 (two) times daily.   levothyroxine 75 MCG tablet Commonly known as: SYNTHROID Take 1 tablet (75 mcg total) by mouth daily at 6 (six) AM. You'll need repeat labs in about 6 weeks. Start taking on: March 02, 2023 What changed:  medication strength how much to take when to take  this additional instructions   LORazepam 0.5 MG tablet Commonly known as: ATIVAN Take 1 tablet (0.5 mg total) by mouth in the morning, at noon, and at bedtime.   mirtazapine 15 MG tablet Commonly known as: REMERON Take 1 tablet (15 mg total) by mouth at bedtime.   ondansetron 4 MG disintegrating tablet Commonly known as: ZOFRAN-ODT Take 1 tablet (4 mg total) by mouth every 8 (eight) hours as needed for nausea or vomiting.   pantoprazole 40 MG tablet Commonly known as: Protonix Take 1 tablet (40 mg total) by mouth 2 (two) times daily. Follow up with gastroenterology   sucralfate 1 g tablet Commonly known as: CARAFATE Take 1 tablet (1 g total) by mouth 4 (four) times daily -  with meals and at bedtime.   VITAMIN D PO Take 1 capsule by mouth daily.       Allergies  Allergen Reactions   Benadryl [Diphenhydramine Hcl] Diarrhea and Other (See Comments)    Reaction:  Makes her excessively sleepy    Alprazolam Diarrhea   Lactose  Intolerance (Gi)     Follow-up Information     Willis Modena, MD Follow up.   Specialty: Gastroenterology Why: follow up with GI outpatient they'll call you about your biopsy results (if you don't hear anything, please call for your results) Contact information: [address] [place] [address] [phone]         Adrian Prince, MD Follow up.   Specialty: Endocrinology Why: Call for Aryanna Shaver follow up appointment you need Lannah Koike repeat TSH in about 6 weeks (thyroid test) Contact information: [address] [place] [address] [phone]                  The results of significant diagnostics from this hospitalization (including imaging, microbiology, ancillary and laboratory) are listed below for reference.    Significant Diagnostic Studies: CT CHEST W CONTRAST Result Date: 02/27/2023 CLINICAL DATA:  Unintentional weight loss EXAM: CT CHEST WITH CONTRAST TECHNIQUE: Multidetector CT imaging of the chest was performed during intravenous contrast administration. RADIATION DOSE REDUCTION: This exam was performed according to the departmental dose-optimization program which includes automated exposure control, adjustment of the mA and/or kV according to patient size and/or use of iterative reconstruction technique. CONTRAST:  OMNIPAQUE IOHEXOL 300 MG/ML  SOLN COMPARISON:  None Available. FINDINGS: Cardiovascular: No significant vascular findings. Normal heart size. No pericardial effusion. Mediastinum/Nodes: Thyroid gland is surgically absent. No enlarged lymph nodes are seen. Esophagus is within normal limits. Lungs/Pleura: There are minimal patchy ground-glass and tree-in-bud opacities in the inferior left upper lobe and left lower lobe. Lungs are otherwise clear. There is no pleural effusion or pneumothorax. Upper Abdomen: No acute abnormality. Musculoskeletal: Bilateral breast implants are present. No significant osseous abnormality. IMPRESSION: 1. Minimal  patchy ground-glass and tree-in-bud opacities in the inferior left upper lobe and left lower lobe, likely infectious/inflammatory. 2. No other acute cardiopulmonary process. Electronically Signed   By: Darliss Cheney M.D.   On: 02/27/2023 19:22   CT Angio Abd/Pel W and/or Wo Contrast Result Date: 02/25/2023 CLINICAL DATA:  Lower GI bleed EXAM: CTA ABDOMEN AND PELVIS WITHOUT AND WITH CONTRAST TECHNIQUE: Multidetector CT imaging of the abdomen and pelvis was performed using the standard protocol during bolus administration of intravenous contrast. Multiplanar reconstructed images and MIPs were obtained and reviewed to evaluate the vascular anatomy. RADIATION DOSE REDUCTION: This exam was performed according to the departmental dose-optimization program which includes automated exposure control, adjustment of the  mA and/or kV according to patient size and/or use of iterative reconstruction technique. CONTRAST:  OMNIPAQUE IOHEXOL 350 MG/ML SOLN COMPARISON:  08/24/2022 FINDINGS: VASCULAR Aorta: Thoracic aorta was imaged. No aneurysm or dissection. Scattered atherosclerosis. Heart is normal size. Abdominal aorta demonstrates scattered calcifications. No aneurysm or dissection. Celiac: Patent without evidence of aneurysm, dissection, vasculitis or significant stenosis. SMA: Patent without evidence of aneurysm, dissection, vasculitis or significant stenosis. Renals: Both renal arteries are patent without evidence of aneurysm, dissection, vasculitis, fibromuscular dysplasia or significant stenosis. IMA: Patent without evidence of aneurysm, dissection, vasculitis or significant stenosis. Inflow: Patent without evidence of aneurysm, dissection, vasculitis or significant stenosis. Proximal Outflow: Bilateral common femoral and visualized portions of the superficial and profunda femoral arteries are patent without evidence of aneurysm, dissection, vasculitis or significant stenosis. Veins: No obvious venous abnormality  within the limitations of this arterial phase study. Review of the MIP images confirms the above findings. NON-VASCULAR Lower chest: Lungs clear.  No effusions. Hepatobiliary: No focal hepatic abnormality. Gallbladder unremarkable. Pancreas: No focal abnormality or ductal dilatation. Spleen: No focal abnormality.  Normal size. Adrenals/Urinary Tract: No adrenal abnormality. No focal renal abnormality. No stones or hydronephrosis. Urinary bladder is unremarkable. Stomach/Bowel: No contrast extravasation to localize GI bleed. Stomach, large and small bowel grossly unremarkable. Normal appendix. Lymphatic: No adenopathy Reproductive: Uterus and adnexa unremarkable.  No mass. Other: No free fluid or free air. Musculoskeletal: No acute bony abnormality. IMPRESSION: VASCULAR No evidence of aortic aneurysm or dissection. Scattered aortic atherosclerosis. No contrast extravasation to localize GI bleed. NON-VASCULAR No acute findings in the chest, abdomen or pelvis. Electronically Signed   By: Charlett Nose M.D.   On: 02/25/2023 19:20    Microbiology: No results found for this or any previous visit (from the past 240 hours).   Labs: Basic Metabolic Panel: Recent Labs  Lab 02/25/23 1732 02/26/23 2316 02/27/23 0711 02/28/23 0522  NA 138 132*  --  128*  K 3.1* 4.2  --  3.7  CL 104 99  --  94*  CO2 22 22  --  21*  GLUCOSE 116* 84  --  96  BUN 7* 11  --  12  CREATININE 0.83 0.91  --  1.03*  CALCIUM 9.1 9.3  --  9.1  MG  --   --  2.1 2.1  PHOS  --   --  4.7* 3.5   Liver Function Tests: Recent Labs  Lab 02/25/23 1732 02/28/23 0522  AST 35 24  ALT 41 29  ALKPHOS 77 70  BILITOT 0.6 1.3*  PROT 7.6 7.1  ALBUMIN 4.0 3.8   Recent Labs  Lab 02/25/23 1732  LIPASE 29   No results for input(s): "AMMONIA" in the last 168 hours. CBC: Recent Labs  Lab 02/25/23 1732 02/26/23 1156 02/26/23 2010 02/27/23 0711 02/28/23 0522  WBC 2.8*  --   --  10.5 13.1*  NEUTROABS  --   --   --   --  9.6*  HGB  5.8* 13.6 12.9 12.4 12.4  HCT 18.3* 40.2 39.1 37.2 36.9  MCV 77.9*  --   --  76.9* 76.7*  PLT 108*  --   --  210 193   Cardiac Enzymes: No results for input(s): "CKTOTAL", "CKMB", "CKMBINDEX", "TROPONINI" in the last 168 hours. BNP: BNP (last 3 results) No results for input(s): "BNP" in the last 8760 hours.  ProBNP (last 3 results) No results for input(s): "PROBNP" in the last 8760 hours.  CBG: No results for input(s): "  GLUCAP" in the last 168 hours.     Signed:  Lacretia Nicks MD.  [facility] 03/01/2023, 12:21 PM

## 2023-03-01 NOTE — Plan of Care (Signed)

## 2023-03-01 NOTE — Consult Note (Signed)
 [facility] Psychiatric Consult Initial  Patient Name: .Lauren Bolton  MRN: [id number]  DOB: 04/18/1960  Consult Order details:  Orders (From admission, onward)     Start     Ordered   02/28/23 0934  IP CONSULT TO PSYCHIATRY       Ordering Provider: Zigmund Daniel., MD  Provider:  (Not yet assigned)  Question Answer Comment  Location [facility]   Reason for Consult? poor PO intake, severe anxiety - she has history of anxiety/depression - concern in past GI symptoms maybe related to her mood disorder      02/28/23 0935             Mode of Visit: In person    Psychiatry Consult Evaluation  Service Date: March 01, 2023 LOS:  LOS: 0 days  Chief Complaint "I feel depressed and anxious all the time."  Primary Psychiatric Diagnoses  Major depressive disorder r/o MDD due to another medical condition (hypothyroidism) 2.  Generalized anxiety disorder   Assessment  Lauren Bolton is a 63 y.o. female admitted: Medicallyfor 02/25/2023  5:18 PM for worsening generalized weakness. She carries the psychiatric diagnoses of anxiety/depression and has a past medical history of failure to thrive in an adult, hypothyroidism, and iron deficiency anemia.    Her current presentation of depressed mood, low energy level, lack of motivation, poor appetite, sleep issues, fatigue, and generalized weakness in a patient with ongoing hypothyroidism is most consistent with depression secondary to another medical condition. She does not meet criteria for inpatient admission based on absent suicidal or homicidal ideation, intent or plan with good support system in her husband.  Current outpatient psychotropic medications include Remeron 7.5 mg at bedtime  and historically she has had a moderate response to these medications. She was compliant with medications prior to admission as evidenced by patient's report. On initial examination, patient is awake, alert, and cooperative. Her mood is  "depressed" with anxious affect. Please see plan below for detailed recommendations.   2/24: Today, the client actively engaged in the conversation with her husband providing additional information.  She reported her depression is "up and down", no suicidal ideations.  Her sleep was better with the Remeron but feels it was given to late (10 pm), discussed taking it earlier.  Appetite is still low but is eating.  Anxiety continues, no side effects from the Buspar that was started, already on Ativan PRN.  She is anxious to return home and feels she sleeps better there.  Denies needing anything beside Rx.  The attending arrived as the assessment closed and he will provide the Rx for her.  [facility] resources provided in discharge instructions.  Diagnoses:  Active Hospital problems: Principal Problem:   Symptomatic anemia Active Problems:   Depression due to physical illness    Plan   ## Psychiatric Medication Recommendations:  Mirtazapine to 15 mg at bedtime for sleep/depression/appetite stimulation Buspirone 5 mg three times daily for anxiety Please optimize synthroid dosage to better control hypothyroidism.  Patient is interested in connecting to an outpatient psychiatric upon hospital discharge.   ## Medical Decision Making Capacity: Not specifically addressed in this encounter  ## Further Work-up:  -- None at this time  -- most recent EKG on 02/27/23 had QtC of 434 -- Pertinent labwork reviewed earlier this admission includes: TSH-13.654, T4-1.17,    ## Disposition:-- There are no psychiatric contraindications to discharge at this time  ## Behavioral / Environmental: - No specific recommendations at this  time.     ## Safety and Observation Level:  - Based on my clinical evaluation, I estimate the patient to be at low risk of self harm in the current setting. - At this time, we recommend  routine. This decision is based on my review of the chart including patient's history and current  presentation, interview of the patient, mental status examination, and consideration of suicide risk including evaluating suicidal ideation, plan, intent, suicidal or self-harm behaviors, risk factors, and protective factors. This judgment is based on our ability to directly address suicide risk, implement suicide prevention strategies, and develop a safety plan while the patient is in the clinical setting. Please contact our team if there is a concern that risk level has changed.  CSSR Risk Category:C-SSRS RISK CATEGORY: No Risk  Suicide Risk Assessment: Patient has following modifiable risk factors for suicide: under treated depression , which we are addressing by prescribing and optimizing patient's medications. Patient has following non-modifiable or demographic risk factors for suicide: Patient has the following protective factors against suicide: Supportive family, Frustration tolerance, and no history of suicide attempts  Thank you for this consult request. Recommendations have been communicated to the primary team.  We sign off at this time as the client is discharging.  Lauren Means, NP       History of Present Illness  Relevant Aspects of [facility] Course:  Admitted on 02/25/2023 due to worsening generalized weakness.   Patient Report:  Patient seen face to face in her hospital room, with patient;'s husband at the bedside. She is awake, alert and oriented x 4. Patient reports ongoing depressed mood, low energy level, lack of motivation, poor appetite, fatigue, and generalized weakness. Additionally, she reports frequent anxiety, apprehension, worry, nervousness, and problem concentrating. However, patient denies delusions, hallucinations, suicidal or homicidal ideation, intent or plan. Patient currently takes Remeron 7.5 mg at bedtime for depression and is interested in optimizing the dose. Patient reports chronic history of hypothyroidism for which she takes Synthroid. She  reports compliance with the medication but the thyroid level is currently non controlled. Patient is currently on Synthroid 75 mcg daily, and weekly increase in dosage per the treating physician.  Psych ROS:  Depression: yes, as described above Anxiety:  yes, as described above  Mania (lifetime and current): denies  Psychosis: (lifetime and current): denies   Collateral information:  Collateral information obtained from the patient's husband at bedside.   Review of Systems  Constitutional: Negative.   HENT: Negative.    Eyes: Negative.   Respiratory: Negative.    Cardiovascular: Negative.   Gastrointestinal: Negative.   Genitourinary: Negative.   Musculoskeletal: Negative.   Skin: Negative.   Neurological: Negative.   Endo/Heme/Allergies: Negative.   Psychiatric/Behavioral:  Positive for depression. The patient is nervous/anxious.      Psychiatric and Social History  Psychiatric History:  Information collected from patient and husband.   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  Spiritual Hx: unknown  Access to weapons/lethal Bolton: denies    Substance History Alcohol: denies   Type of alcohol N/A Last Drink N/A Number of drinks  per day N/A History of alcohol withdrawal seizures N/A History of DT's N/A Tobacco: N/A Illicit drugs: denies  Prescription drug abuse: denies  Rehab hx: denies   Exam Findings  Physical Exam:   General: appears chronically anxious Lungs: unlabored Neurological: Alert and oriented 3. Moves all extremities 4 with equal strength. Cranial nerves  II through XII grossly intact. Extremities: No clubbing or cyanosis. No edema.    Vital Signs:  Temp:  [98.2 F (36.8 C)-98.6 F (37 C)] 98.3 F (36.8 C) (02/24 0416) Pulse Rate:  [73-76] 73 (02/24 0416) Resp:  [16-20] 20 (02/24 0416) BP: (137-140)/(78-91) 140/91 (02/24 0416) SpO2:  [99 %-100 %] 99 % (02/24 0416) Weight:  [45 kg] 45 kg (02/24 0500) Blood pressure (!) 140/91, pulse 73, temperature 98.3 F (36.8 C), temperature source Oral, resp. rate 20, height 5' (1.524 m), weight 45 kg, SpO2 99%. Body mass index is 19.38 kg/m.  Physical Exam Vitals and nursing note reviewed.  Constitutional:      Appearance: Normal appearance.  HENT:     Head: Normocephalic.     Nose: Nose normal.  Pulmonary:     Effort: Pulmonary effort is normal.  Musculoskeletal:     Cervical back: Normal range of motion.  Neurological:     General: No focal deficit present.     Mental Status: She is alert and oriented to person, place, and time.     Mental Status Exam: General Appearance: Casual  Orientation:  Full (Time, Place, and Person)  Memory:  Immediate;   Good  Concentration:  Good  Recall:  Good  Attention  Good  Eye Contact:  Good  Speech:  WDL  Language:  Good  Volume:  WDL  Mood: "Depression is up and down"  Affect:   anxious  Thought Process:  Linear  Thought Content:  Logical  Suicidal Thoughts:  No  Homicidal Thoughts:  No  Judgement:  Good  Insight:  Good  Psychomotor Activity:  WDL  Akathisia:  No  Fund of Knowledge:  Good      Assets:  Communication Skills  Cognition:  WNL  ADL's:  Intact  AIMS (if indicated):        Other History   These have been pulled in through the EMR, reviewed, and updated if appropriate.  Family History:  The patient's family history includes Hypertension in her mother.  Medical History: Past Medical History:  Diagnosis Date   Anxiety    Depression    Hypothyroidism    Iron deficiency anemia    Thyroid disease     Surgical  History: Past Surgical History:  Procedure Laterality Date   BROW LIFT Bilateral 02/03/2022   Procedure: Blepharoplasty Upper Eyelids;  Surgeon: Santiago Glad, MD;  Location: [facility];  Service: Plastics;  Laterality: Bilateral;   ESOPHAGOGASTRODUODENOSCOPY N/A 11/13/2015   Procedure: ESOPHAGOGASTRODUODENOSCOPY (EGD);  Surgeon: Dorena Cookey, MD;  Location: [facility];  Service: Endoscopy;  Laterality: N/A;   ESOPHAGOGASTRODUODENOSCOPY (EGD) WITH PROPOFOL N/A 12/26/2015   Procedure: ESOPHAGOGASTRODUODENOSCOPY (EGD) WITH PROPOFOL;  Surgeon: Bernette Redbird, MD;  Location[facility];  Service: Endoscopy;  Laterality: N/A;   PLACEMENT OF BREAST IMPLANTS     20 years ago   thyroid goiter       Medications:   Current Facility-Administered Medications:    acetaminophen (TYLENOL) tablet 650 mg, 650 mg, Oral, Q6H PRN, Willis Modena, MD, 650 mg at 02/27/23 2146   busPIRone (BUSPAR) tablet 5 mg, 5 mg, Oral, TID, Akintayo, Musa  A, MD, 5 mg at 03/01/23 1610   cholecalciferol (VITAMIN D3) 25 MCG (1000 UNIT) tablet 2,000 Units, 2,000 Units, Oral, Daily, Willis Modena, MD, 2,000 Units at 03/01/23 0904   iron polysaccharides (NIFEREX) capsule 150 mg, 150 mg, Oral, BID, Willis Modena, MD, 150 mg at 02/28/23 2135   levothyroxine (SYNTHROID) tablet 75 mcg, 75 mcg, Oral, Q0600, Zigmund Daniel., MD, 75 mcg at 03/01/23 0530   LORazepam (ATIVAN) tablet 0.5 mg, 0.5 mg, Oral, TID PRN, Willis Modena, MD, 0.5 mg at 03/01/23 9604   melatonin tablet 5 mg, 5 mg, Oral, QHS PRN, Willis Modena, MD, 5 mg at 02/26/23 2330   mirtazapine (REMERON) tablet 15 mg, 15 mg, Oral, QHS, Akintayo, Musa A, MD, 15 mg at 02/28/23 2133   ondansetron (ZOFRAN-ODT) disintegrating tablet 4 mg, 4 mg, Oral, Q8H PRN, Zigmund Daniel., MD, 4 mg at 02/28/23 2134   pantoprazole (PROTONIX) injection 40 mg, 40 mg, Intravenous, BID, Willis Modena, MD, 40 mg at 03/01/23 5409   prochlorperazine (COMPAZINE)  injection 5 mg, 5 mg, Intravenous, Q6H PRN, Willis Modena, MD   sucralfate (CARAFATE) 1 GM/10ML suspension 1 g, 1 g, Oral, TID WC & HS, Willis Modena, MD, 1 g at 03/01/23 8119  Allergies: Allergies  Allergen Reactions   Benadryl [Diphenhydramine Hcl] Diarrhea and Other (See Comments)    Reaction:  Makes her excessively sleepy    Alprazolam Diarrhea   Lactose Intolerance (Gi)     Lauren Means, NP

## 2023-03-01 NOTE — Discharge Instructions (Signed)
  Follow up with depression and anxiety, if needed: [name] [phone] Address: [address] [place], [place] [address]  Open 24/7, No appointment required.

## 2023-03-01 NOTE — Progress Notes (Signed)
 TOC meds in a secure bag delivered to pt in room by this RN.

## 2023-03-02 ENCOUNTER — Other Ambulatory Visit (HOSPITAL_COMMUNITY): Payer: Self-pay

## 2023-03-02 LAB — ZINC: Zinc: 115 ug/dL (ref 44–115)

## 2023-03-02 LAB — TISSUE TRANSGLUTAMINASE, IGA: Tissue Transglutaminase Ab, IgA: <2 U/mL (ref 0–3)

## 2023-03-02 LAB — VITAMIN A: Vitamin A (Retinoic Acid): 33.5 ug/dL (ref 22.0–69.5)

## 2023-03-03 LAB — RETICULIN ANTIBODIES, IGA W TITER: Reticulin Ab, IgA: NEGATIVE {titer} (ref <2.5)

## 2023-03-03 LAB — SURGICAL PATHOLOGY

## 2023-03-09 DIAGNOSIS — D649 Anemia, unspecified: Secondary | ICD-10-CM | POA: Diagnosis not present

## 2023-04-07 DIAGNOSIS — D649 Anemia, unspecified: Secondary | ICD-10-CM | POA: Diagnosis not present

## 2023-04-07 DIAGNOSIS — E890 Postprocedural hypothyroidism: Secondary | ICD-10-CM | POA: Diagnosis not present

## 2023-04-07 DIAGNOSIS — R7301 Impaired fasting glucose: Secondary | ICD-10-CM | POA: Diagnosis not present

## 2023-04-28 DIAGNOSIS — K080 Exfoliation of teeth due to systemic causes: Secondary | ICD-10-CM | POA: Diagnosis not present

## 2023-05-05 DIAGNOSIS — K080 Exfoliation of teeth due to systemic causes: Secondary | ICD-10-CM | POA: Diagnosis not present

## 2023-05-12 DIAGNOSIS — M810 Age-related osteoporosis without current pathological fracture: Secondary | ICD-10-CM | POA: Diagnosis not present

## 2023-05-12 DIAGNOSIS — Z13228 Encounter for screening for other metabolic disorders: Secondary | ICD-10-CM | POA: Diagnosis not present

## 2023-05-12 DIAGNOSIS — Z1329 Encounter for screening for other suspected endocrine disorder: Secondary | ICD-10-CM | POA: Diagnosis not present

## 2023-05-12 DIAGNOSIS — Z1321 Encounter for screening for nutritional disorder: Secondary | ICD-10-CM | POA: Diagnosis not present

## 2023-05-26 DIAGNOSIS — E890 Postprocedural hypothyroidism: Secondary | ICD-10-CM | POA: Diagnosis not present

## 2023-05-26 DIAGNOSIS — K080 Exfoliation of teeth due to systemic causes: Secondary | ICD-10-CM | POA: Diagnosis not present

## 2023-06-02 DIAGNOSIS — H5203 Hypermetropia, bilateral: Secondary | ICD-10-CM | POA: Diagnosis not present

## 2023-06-09 DIAGNOSIS — Z79899 Other long term (current) drug therapy: Secondary | ICD-10-CM | POA: Diagnosis not present

## 2023-06-09 DIAGNOSIS — E890 Postprocedural hypothyroidism: Secondary | ICD-10-CM | POA: Diagnosis not present

## 2023-06-09 DIAGNOSIS — A048 Other specified bacterial intestinal infections: Secondary | ICD-10-CM | POA: Diagnosis not present

## 2023-07-14 DIAGNOSIS — Z79899 Other long term (current) drug therapy: Secondary | ICD-10-CM | POA: Diagnosis not present

## 2023-07-15 DIAGNOSIS — E890 Postprocedural hypothyroidism: Secondary | ICD-10-CM | POA: Diagnosis not present

## 2023-07-28 DIAGNOSIS — E890 Postprocedural hypothyroidism: Secondary | ICD-10-CM | POA: Diagnosis not present

## 2023-07-28 DIAGNOSIS — R7301 Impaired fasting glucose: Secondary | ICD-10-CM | POA: Diagnosis not present

## 2023-10-26 ENCOUNTER — Other Ambulatory Visit: Payer: Self-pay

## 2023-10-26 ENCOUNTER — Emergency Department (HOSPITAL_COMMUNITY)

## 2023-10-26 ENCOUNTER — Encounter (HOSPITAL_COMMUNITY): Payer: Self-pay

## 2023-10-26 ENCOUNTER — Emergency Department (HOSPITAL_COMMUNITY)
Admission: EM | Admit: 2023-10-26 | Discharge: 2023-10-26 | Disposition: A | Discharge status: Home / Self Care | Attending: Emergency Medicine | Admitting: Emergency Medicine

## 2023-10-26 DIAGNOSIS — E860 Dehydration: Secondary | ICD-10-CM | POA: Insufficient documentation

## 2023-10-26 DIAGNOSIS — F419 Anxiety disorder, unspecified: Secondary | ICD-10-CM | POA: Insufficient documentation

## 2023-10-26 DIAGNOSIS — R197 Diarrhea, unspecified: Secondary | ICD-10-CM | POA: Insufficient documentation

## 2023-10-26 DIAGNOSIS — R5383 Other fatigue: Secondary | ICD-10-CM | POA: Insufficient documentation

## 2023-10-26 DIAGNOSIS — F32A Depression, unspecified: Secondary | ICD-10-CM | POA: Diagnosis not present

## 2023-10-26 DIAGNOSIS — R6883 Chills (without fever): Secondary | ICD-10-CM | POA: Diagnosis not present

## 2023-10-26 DIAGNOSIS — E039 Hypothyroidism, unspecified: Secondary | ICD-10-CM | POA: Insufficient documentation

## 2023-10-26 DIAGNOSIS — Z79890 Hormone replacement therapy (postmenopausal): Secondary | ICD-10-CM | POA: Insufficient documentation

## 2023-10-26 DIAGNOSIS — R55 Syncope and collapse: Secondary | ICD-10-CM | POA: Diagnosis not present

## 2023-10-26 DIAGNOSIS — M6281 Muscle weakness (generalized): Secondary | ICD-10-CM | POA: Diagnosis not present

## 2023-10-26 DIAGNOSIS — R0602 Shortness of breath: Secondary | ICD-10-CM | POA: Diagnosis not present

## 2023-10-26 DIAGNOSIS — R457 State of emotional shock and stress, unspecified: Secondary | ICD-10-CM | POA: Diagnosis not present

## 2023-10-26 DIAGNOSIS — E890 Postprocedural hypothyroidism: Secondary | ICD-10-CM | POA: Diagnosis not present

## 2023-10-26 DIAGNOSIS — R059 Cough, unspecified: Secondary | ICD-10-CM | POA: Diagnosis not present

## 2023-10-26 DIAGNOSIS — R0989 Other specified symptoms and signs involving the circulatory and respiratory systems: Secondary | ICD-10-CM | POA: Diagnosis not present

## 2023-10-26 DIAGNOSIS — R531 Weakness: Secondary | ICD-10-CM | POA: Diagnosis not present

## 2023-10-26 LAB — RESP PANEL BY RT-PCR (RSV, FLU A&B, COVID)  RVPGX2
Influenza A by PCR: NEGATIVE
Influenza B by PCR: NEGATIVE
Resp Syncytial Virus by PCR: NEGATIVE
SARS Coronavirus 2 by RT PCR: NEGATIVE

## 2023-10-26 LAB — COMPREHENSIVE METABOLIC PANEL WITH GFR
ALT: 20 U/L (ref 0–44)
AST: 42 U/L — ABNORMAL HIGH (ref 15–41)
Albumin: 4 g/dL (ref 3.5–5.0)
Alkaline Phosphatase: 77 U/L (ref 38–126)
Anion gap: 11 (ref 5–15)
BUN: 10 mg/dL (ref 8–23)
CO2: 22 mmol/L (ref 22–32)
Calcium: 9.1 mg/dL (ref 8.9–10.3)
Chloride: 103 mmol/L (ref 98–111)
Creatinine, Ser: 0.94 mg/dL (ref 0.44–1.00)
GFR, Estimated: >60 mL/min (ref >60)
Glucose, Bld: 96 mg/dL (ref 70–99)
Potassium: 4.6 mmol/L (ref 3.5–5.1)
Sodium: 136 mmol/L (ref 135–145)
Total Bilirubin: 1 mg/dL (ref 0.0–1.2)
Total Protein: 7.6 g/dL (ref 6.5–8.1)

## 2023-10-26 LAB — URINALYSIS, ROUTINE W REFLEX MICROSCOPIC
Bilirubin Urine: NEGATIVE
Glucose, UA: NEGATIVE mg/dL
Hgb urine dipstick: NEGATIVE
Ketones, ur: NEGATIVE mg/dL
Nitrite: NEGATIVE
Protein, ur: NEGATIVE mg/dL
Specific Gravity, Urine: 1.003 — ABNORMAL LOW (ref 1.005–1.030)
pH: 7 (ref 5.0–8.0)

## 2023-10-26 LAB — CBC
HCT: 36.7 % (ref 36.0–46.0)
Hemoglobin: 11.8 g/dL — ABNORMAL LOW (ref 12.0–15.0)
MCH: 24.4 pg — ABNORMAL LOW (ref 26.0–34.0)
MCHC: 32.2 g/dL (ref 30.0–36.0)
MCV: 76 fL — ABNORMAL LOW (ref 80.0–100.0)
Platelets: 207 K/uL (ref 150–400)
RBC: 4.83 MIL/uL (ref 3.87–5.11)
RDW: 14.3 % (ref 11.5–15.5)
WBC: 5.1 K/uL (ref 4.0–10.5)
nRBC: 0 % (ref 0.0–0.2)

## 2023-10-26 LAB — T4, FREE: Free T4: 1.01 ng/dL (ref 0.61–1.12)

## 2023-10-26 LAB — CBG MONITORING, ED: Glucose-Capillary: 93 mg/dL (ref 70–99)

## 2023-10-26 LAB — CK: Total CK: 119 U/L (ref 38–234)

## 2023-10-26 LAB — LIPASE, BLOOD: Lipase: 33 U/L (ref 11–51)

## 2023-10-26 LAB — TSH: TSH: 0.611 u[IU]/mL (ref 0.350–4.500)

## 2023-10-26 LAB — MAGNESIUM: Magnesium: 2.1 mg/dL (ref 1.7–2.4)

## 2023-10-26 MED ORDER — SODIUM CHLORIDE 0.9 % IV BOLUS
1000.0000 mL | Freq: Once | INTRAVENOUS | Status: AC
  Administered 2023-10-26: 1000 mL via INTRAVENOUS

## 2023-10-26 MED ORDER — ONDANSETRON 4 MG PO TBDP: 4.0000 mg | ORAL_TABLET | Freq: Three times a day (TID) | ORAL | 0 refills | Status: DC | PRN

## 2023-10-26 MED ORDER — ONDANSETRON 4 MG PO TBDP: 4.0000 mg | ORAL_TABLET | Freq: Three times a day (TID) | ORAL | 0 refills | Status: AC | PRN

## 2023-10-26 NOTE — ED Triage Notes (Addendum)
 Pt bib [facility] coming from home. Pt called on 911 for SOB. On EMS arrival, patient not short of breath but was complaining of fatigue x3 days and generalized weakness that began 45 minutes ago. Pt reports to EMS that weakness is also in legs and makes it hard to walk. Pt has hx of thyroid  disease and anxiety.  EMS VS: 110/88 76 HR 98% RA 137 cbg

## 2023-10-26 NOTE — ED Notes (Signed)
 ED Provider at bedside.

## 2023-10-26 NOTE — Discharge Instructions (Addendum)
 Your history, exam, workup today did not show evidence of concerning finding with your thyroid  but I am concerned you were slightly dehydrated likely to a viral illness with your symptoms and diarrhea.  We were able to give you IV fluids but please increase your hydration over the next few days and use the nausea medicine if you get nauseous.  There was no evidence of pneumonia and you were negative for COVID/flu/RSV.  Your urine did not show convincing evidence of infection given your lack of urinary changes however a culture was sent.  The rest of your labs including your labs for your thyroid  were reassuring and similar to prior.  We feel you are safe for discharge home.  Please rest for the next few days and follow-up with your primary doctor.  If any symptoms change or worsen acutely, please return to the nearest emergency department.

## 2023-10-26 NOTE — ED Provider Notes (Signed)
 [place] EMERGENCY DEPARTMENT AT [facility] Provider Note   CSN: [id number] Arrival date & time: 10/26/23  8365     Patient presents with: Fatigue and Weakness   Lauren Bolton is a 63 y.o. female.   The history is provided by the patient and medical records. The history is limited by a language barrier. No language interpreter was used (Interpreter was offered and patient felt comfortable speaking in [place].).  Weakness Severity:  Moderate Onset quality:  Gradual Duration:  2 days Timing:  Constant Progression:  Waxing and waning Chronicity:  Recurrent Relieved by:  Nothing Worsened by:  Nothing Associated symptoms: cough, diarrhea and near-syncope   Associated symptoms: no abdominal pain, no ataxia, no chest pain, no dysuria, no numbness in extremities, no falls, no fever, no foul-smelling urine, no headaches, no loss of consciousness, no nausea, no shortness of breath, no stroke symptoms, no vision change and no vomiting        Prior to Admission medications   Medication Sig Start Date End Date Taking? Authorizing Provider  Cholecalciferol  (VITAMIN D  PO) Take 1 capsule by mouth daily.    [provider]  iron  polysaccharides (NIFEREX) 150 MG capsule Take 1 capsule (150 mg total) by mouth 2 (two) times daily. 02/06/16   Secundino Cones, NP  levothyroxine  (SYNTHROID ) 75 MCG tablet Take 1 tablet (75 mcg total) by mouth daily at 6 (six) AM. You'll need repeat labs in about 6 weeks. 03/02/23 05/01/23  Perri DELENA Meliton Mickey., MD  LORazepam  (ATIVAN ) 0.5 MG tablet Take 1 tablet (0.5 mg total) by mouth in the morning, at noon, and at bedtime. 03/01/23   Perri DELENA Meliton Mickey., MD  mirtazapine  (REMERON ) 15 MG tablet Take 1 tablet (15 mg total) by mouth at bedtime. 03/01/23 04/30/23  Perri DELENA Meliton Mickey., MD  ondansetron  (ZOFRAN -ODT) 4 MG disintegrating tablet Take 1 tablet (4 mg total) by mouth every 8 (eight) hours as needed for nausea or vomiting. 01/22/23   Rizwan,  Saima, MD  pantoprazole  (PROTONIX ) 40 MG tablet Take 1 tablet (40 mg total) by mouth 2 (two) times daily. Follow up with gastroenterology 03/01/23 04/30/23  Perri DELENA Meliton Mickey., MD  sucralfate  (CARAFATE ) 1 g tablet Take 1 tablet (1 g total) by mouth 4 (four) times daily -  with meals and at bedtime. 03/01/23 04/30/23  Perri DELENA Meliton Mickey., MD    Allergies: Benadryl  [diphenhydramine  hcl], Alprazolam , and Lactose intolerance (gi)    Review of Systems  Constitutional:  Positive for chills and fatigue. Negative for fever.  HENT:  Negative for congestion.   Eyes:  Negative for visual disturbance.  Respiratory:  Positive for cough. Negative for chest tightness, shortness of breath and stridor.   Cardiovascular:  Positive for palpitations and near-syncope. Negative for chest pain.  Gastrointestinal:  Positive for diarrhea. Negative for abdominal pain, constipation, nausea and vomiting.  Genitourinary:  Negative for dysuria and flank pain.  Musculoskeletal:  Negative for back pain, falls, neck pain and neck stiffness.  Skin:  Negative for rash and wound.  Neurological:  Positive for weakness and light-headedness. Negative for loss of consciousness, syncope, speech difficulty, numbness and headaches.  Psychiatric/Behavioral:  Negative for agitation and confusion.   All other systems reviewed and are negative.   Updated Vital Signs BP (!) 149/78   Pulse 73   Temp 98.2 F (36.8 C)   Resp (!) 24   SpO2 100%   Physical Exam Vitals and nursing note reviewed.  Constitutional:  General: She is not in acute distress.    Appearance: She is well-developed. She is not ill-appearing, toxic-appearing or diaphoretic.  HENT:     Head: Normocephalic and atraumatic.     Nose: No congestion or rhinorrhea.     Mouth/Throat:     Mouth: Mucous membranes are dry.     Pharynx: No oropharyngeal exudate or posterior oropharyngeal erythema.  Eyes:     Extraocular Movements: Extraocular movements intact.      Conjunctiva/sclera: Conjunctivae normal.     Pupils: Pupils are equal, round, and reactive to light.  Cardiovascular:     Rate and Rhythm: Normal rate and regular rhythm.     Pulses: Normal pulses.     Heart sounds: No murmur heard. Pulmonary:     Effort: Pulmonary effort is normal. No respiratory distress.     Breath sounds: Normal breath sounds. No wheezing, rhonchi or rales.  Chest:     Chest wall: No tenderness.  Abdominal:     Palpations: Abdomen is soft.     Tenderness: There is no abdominal tenderness. There is no right CVA tenderness, left CVA tenderness, guarding or rebound.  Musculoskeletal:        General: No swelling or tenderness.     Cervical back: Neck supple. No tenderness.     Right lower leg: No edema.     Left lower leg: No edema.  Skin:    General: Skin is warm and dry.     Capillary Refill: Capillary refill takes less than 2 seconds.     Findings: No erythema or rash.  Neurological:     General: No focal deficit present.     Mental Status: She is alert.  Psychiatric:        Mood and Affect: Mood normal.     (all labs ordered are listed, but only abnormal results are displayed) Labs Reviewed  COMPREHENSIVE METABOLIC PANEL WITH GFR - Abnormal; Notable for the following components:      Result Value   AST 42 (*)    All other components within normal limits  CBC - Abnormal; Notable for the following components:   Hemoglobin 11.8 (*)    MCV 76.0 (*)    MCH 24.4 (*)    All other components within normal limits  URINALYSIS, ROUTINE W REFLEX MICROSCOPIC - Abnormal; Notable for the following components:   Color, Urine STRAW (*)    Specific Gravity, Urine 1.003 (*)    Leukocytes,Ua TRACE (*)    Bacteria, UA RARE (*)    All other components within normal limits  RESP PANEL BY RT-PCR (RSV, FLU A&B, COVID)  [id number]  URINE CULTURE  CK  LIPASE, BLOOD  MAGNESIUM   T4, FREE  TSH  CBG MONITORING, ED    EKG: EKG Interpretation Date/Time:  Tuesday  October 26 2023 16:57:37 EDT Ventricular Rate:  73 PR Interval:  140 QRS Duration:  88 QT Interval:  415 QTC Calculation: 458 R Axis:   81  Text Interpretation: Sinus rhythm Borderline right axis deviation RSR' in V1 or V2, probably normal variant when compared to prior, previous t wave inversions now upright in V1-V5 No STEMI Confirmed by Ginger Barefoot ([id number]) on 10/26/2023 5:45:50 PM  Radiology: DG Chest Portable 1 View Result Date: 10/26/2023 CLINICAL DATA:  cough, fatigue, near syncope EXAM: PORTABLE CHEST - 1 VIEW COMPARISON:  08/21/2022, 02/27/2023 FINDINGS: Low lung volumes. No focal airspace consolidation, pleural effusion, or pneumothorax. No cardiomegaly. Aortic atherosclerosis. No acute fracture or destructive lesions. Multilevel  thoracic osteophytosis. Surgical clips in the neck. IMPRESSION: Low lung volumes.  Otherwise, no acute cardiopulmonary abnormality. Electronically Signed   By: Rogelia Myers M.D.   On: 10/26/2023 18:48     Procedures   Medications Ordered in the ED  sodium chloride  0.9 % bolus 1,000 mL (has no administration in time range)                                    Medical Decision Making Amount and/or Complexity of Data Reviewed Labs: ordered. Radiology: ordered.  Risk Prescription drug management.    Lauren Bolton is a 63 y.o. female with a past medical history significant for thyroid  disease, anxiety, depression, and previous malnutrition electrolyte disturbances who presents with chills, fatigue, cough, myalgias, malaise, and lightheadedness/near syncope.  According to patient, she has had some cough and fatigue for the last 3 days but has significant worsened today.  She reports feeling very lightheaded earlier.  She is telling me this feels somewhat similar to when she had symptomatic hypothyroidism in the past.  She has had some diarrhea but is denying nausea or vomiting today.  She denies sick contacts.  She has had a nonproductive cough.   She does report some palpitations.  Denies hemoptysis.  Denies nausea or vomiting today.  Denies trauma.  On exam, lungs clear.  Patient is anxious but resting.  I did not appreciate a murmur.  Lungs were clear.  Abdomen nontender.  Back nontender.  No focal neurologic deficits initially.  Legs nontender nonedematous.  Intact pulses.  EKG does not show STEMI.  Given the patient reporting that she feels near syncopal and lightheaded similar to how she has felt with her thyroid  acting up, we will get a free T4 and TSH.  Will get screening labs.  Will get a chest x-ray given the cough and check for COVID/flu/RSV.  Will check urinalysis given the fatigue and chills.  Will give some fluids as she does have dry mucous membranes.  Clinically I suspect she may have a viral illness leading to some dehydration with her diarrhea.  Anticipate reassessment after workup to determine disposition.  Patient felt some better after fluids.  Her workup was overall reassuring.  TSH and free T4 were normal.  She still denies any urinary symptoms so we will make sure a culture is sent but do not feel she has UTI.  No evidence of rhabdo and labs otherwise reassuring.  Suspect clinical dehydration given her dry mouth and her feeling better after fluids.  Suspect viral illness and she was also negative for COVID/which was RSV.  No pneumonia.  As patient is feeling better, will plan for discharge given her reassuring evaluation and workup.  She will give her Zofran  to go into and hydration at home and agreed with follow-up with her PCP.  She is with questions or concerns and was discharged in good condition.     Final diagnoses:  Fatigue, unspecified type  Dehydration  Chills  Diarrhea, unspecified type    ED Discharge Orders          Ordered    ondansetron  (ZOFRAN -ODT) 4 MG disintegrating tablet  Every 8 hours PRN,   Status:  Discontinued        10/26/23 2246    ondansetron  (ZOFRAN -ODT) 4 MG disintegrating  tablet  Every 8 hours PRN        10/26/23 2249  Clinical Impression: 1. Fatigue, unspecified type   2. Dehydration   3. Chills   4. Diarrhea, unspecified type     Disposition: Discharge  Condition: Good  I have discussed the results, Dx and Tx plan with the pt(& family if present). He/she/they expressed understanding and agree(s) with the plan. Discharge instructions discussed at great length. Strict return precautions discussed and pt &/or family have verbalized understanding of the instructions. No further questions at time of discharge.    Discharge Medication List as of 10/26/2023 10:47 PM     START taking these medications   Details  !! ondansetron  (ZOFRAN -ODT) 4 MG disintegrating tablet Take 1 tablet (4 mg total) by mouth every 8 (eight) hours as needed for nausea or vomiting., Starting Tue 10/26/2023, Print     !! - Potential duplicate medications found. Please discuss with provider.      Follow Up: Nichole Senior, MD [address] [place] [address] [phone]     [facility] Emergency Department at [facility] [address] [place]  [address] [phone]        Sahvanna Mcmanigal, Lonni PARAS, MD 10/26/23 2328

## 2023-10-26 NOTE — ED Notes (Signed)
 Notified main lab of lab add ons. [employer] labels sent to lab.

## 2023-11-10 DIAGNOSIS — E890 Postprocedural hypothyroidism: Secondary | ICD-10-CM | POA: Diagnosis not present

## 2023-11-10 DIAGNOSIS — R7301 Impaired fasting glucose: Secondary | ICD-10-CM | POA: Diagnosis not present
